# Patient Record
Sex: Male | Born: 1985 | Race: White | Hispanic: No | Marital: Single | Smoking: Current every day smoker
Health system: Southern US, Community
[De-identification: ages and names within clinical notes are randomized; demographics above are authoritative.]

## PROBLEM LIST (undated history)

## (undated) DIAGNOSIS — F909 Attention-deficit hyperactivity disorder, unspecified type: Secondary | ICD-10-CM

## (undated) DIAGNOSIS — E785 Hyperlipidemia, unspecified: Secondary | ICD-10-CM

## (undated) DIAGNOSIS — I10 Essential (primary) hypertension: Secondary | ICD-10-CM

## (undated) DIAGNOSIS — G43909 Migraine, unspecified, not intractable, without status migrainosus: Secondary | ICD-10-CM

## (undated) DIAGNOSIS — E7800 Pure hypercholesterolemia, unspecified: Secondary | ICD-10-CM

## (undated) HISTORY — DX: Attention-deficit hyperactivity disorder, unspecified type: F90.9

## (undated) HISTORY — DX: Migraine, unspecified, not intractable, without status migrainosus: G43.909

## (undated) HISTORY — DX: Essential (primary) hypertension: I10

## (undated) HISTORY — DX: Hyperlipidemia, unspecified: E78.5

## (undated) HISTORY — PX: CYST EXCISION: SHX5701

---
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
## 2013-04-13 NOTE — H&P (Signed)
  NTS SOAP Note  Vital Signs:  Vitals as of: 04/13/2013: Systolic 132: Diastolic 84: Heart Rate 94: Temp 98.5F: Height 44ft 0in: Weight 152Lbs 0 Ounces: Pain Level 8: BMI 20.61  BMI : 20.61 kg/m2  Subjective: This 27 Years 57 Months old Male presents for of a pilonidal cyst.  Has been having recurrent swelling and pain in the pilonidal region for over a year. He was incised and drained in the remote past. He now presents for further management treatment of his chronic pilonidal cyst. He denies any recent drainage. He does have intermittent pain in this region. No fever or chills have been noted.  Review of Symptoms:  Constitutional:unremarkable   Head:unremarkable    Eyes:unremarkable   Nose/Mouth/Throat:unremarkable Cardiovascular:  unremarkable   Respiratory:unremarkable   Gastrointestinal:  unremarkable   Genitourinary:unremarkable     Musculoskeletal:unremarkable   boils Hematolgic/Lymphatic:unremarkable     Allergic/Immunologic:unremarkable     Past Medical History:    Reviewed   Past Medical History  Surgical History: none Medical Problems: none Psychiatric History: none Allergies: no drug allergies Medications: none   Social History:Reviewed  Social History  Preferred Language: English Race:  White Ethnicity: Not Hispanic / Latino Age: 27 Years 7 Months Marital Status:  S Alcohol: none Recreational drug(s): denies   Smoking Status: Current every day smoker reviewed on 03/13/2012 Started Date: 04/29/2000 Packs per day: 0.50 Functional Status reviewed on mm/dd/yyyy ------------------------------------------------ Bathing: Normal Cooking: Normal Dressing: Normal Driving: Normal Eating: Normal Managing Meds: Normal Oral Care: Normal Shopping: Normal Toileting: Normal Transferring: Normal Walking: Normal Cognitive Status reviewed on mm/dd/yyyy ------------------------------------------------ Attention:  Normal Decision Making: Normal Language: Normal Memory: Normal Motor: Normal Perception: Normal Problem Solving: Normal Visual and Spatial: Normal   Family History:  Reviewed  Family Health History Family History is Unknown    Objective Information: General:  Well appearing, well nourished in no distress.      pilonidal cyst present over coccyx with multiple Hairfilled punctums was present. No drainage or erythema noted. Heart:  RRR, no murmur Lungs:    CTA bilaterally, no wheezes, rhonchi, rales.  Breathing unlabored.  Assessment:pilonidal cyst    Plan:scheduled for excision of a pilonidal cyst on 04/16/2013.   Patient Education:Alternative treatments to surgery were discussed with patient (and family).  Risks and benefits  of procedure including bleeding, infection, and recurrence of the cyst were fully explained to the patient (and family) who gave informed consent. Patient/family questions were addressed.  Follow-up:Pending Surgery

## 2013-04-14 ENCOUNTER — Other Ambulatory Visit (HOSPITAL_COMMUNITY): Payer: Self-pay | Admitting: Urology

## 2013-04-14 DIAGNOSIS — R300 Dysuria: Secondary | ICD-10-CM

## 2013-04-14 DIAGNOSIS — D4959 Neoplasm of unspecified behavior of other genitourinary organ: Secondary | ICD-10-CM

## 2013-04-14 NOTE — Patient Instructions (Addendum)
Tyler Dean  04/14/2013   Your procedure is scheduled on:  04/16/2013  Report to [facility] at  11:20  AM.  Call this number if you have problems the morning of surgery: [phone]   Remember:   Do not eat food or drink liquids after midnight.   Take these medicines the morning of surgery with A SIP OF WATER: none   Do not wear jewelry, make-up or nail polish.  Do not wear lotions, powders, or perfumes.  Do not shave 48 hours prior to surgery. Men may shave face and neck.  Do not bring valuables to the hospital.  [facility] is not responsible for any belongings or valuables.               Contacts, dentures or bridgework may not be worn into surgery.  Leave suitcase in the car. After surgery it may be brought to your room.  For patients admitted to the hospital, discharge time is determined by your treatment team.               Patients discharged the day of surgery will not be allowed to drive home.  Name and phone number of your driver: family  Special Instructions: Shower using CHG 2 nights before surgery and the night before surgery.  If you shower the day of surgery use CHG.  Use special wash - you have one bottle of CHG for all showers.  You should use approximately 1/3 of the bottle for each shower.   Please read over the following fact sheets that you were given: Pain Booklet, Coughing and Deep Breathing, Surgical Site Infection Prevention, Anesthesia Post-op Instructions and Care and Recovery After Surgery   Pilonidal Cyst A pilonidal cyst occurs when hairs get trapped (ingrown) beneath the skin in the crease between the buttocks over your sacrum (the bone under that crease). Pilonidal cysts are most common in young men with a lot of body hair. When the cyst is ruptured (breaks) or leaking, fluid from the cyst may cause burning and itching. If the cyst becomes infected, it causes a painful swelling filled with pus (abscess). The pus and trapped hairs need to be  removed (often by lancing) so that the infection can heal. However, recurrence is common and an operation may be needed to remove the cyst. HOME CARE INSTRUCTIONS   If the cyst was NOT INFECTED:  Keep the area clean and dry. Bathe or shower daily. Wash the area well with a germ-killing soap. Warm tub baths may help prevent infection and help with drainage. Dry the area well with a towel.  Avoid tight clothing to keep area as moisture free as possible.  Keep area between buttocks as free of hair as possible. A depilatory may be used.  If the cyst WAS INFECTED and needed to be drained:  Your caregiver packed the wound with gauze to keep the wound open. This allows the wound to heal from the inside outwards and continue draining.  Return for a wound check in 1 day or as suggested.  If you take tub baths or showers, repack the wound with gauze following them. Sponge baths (at the sink) are a good alternative.  If an antibiotic was ordered to fight the infection, take as directed.  Only take over-the-counter or prescription medicines for pain, discomfort, or fever as directed by your caregiver.  After the drain is removed, use sitz baths for 20 minutes 4 times per day. Clean the wound  gently with mild unscented soap, pat dry, and then apply a dry dressing. SEEK MEDICAL CARE IF:   You have increased pain, swelling, redness, drainage, or bleeding from the area.  You have a fever.  You have muscles aches, dizziness, or a general ill feeling. Document Released: 04/12/2000 Document Revised: 07/08/2011 Document Reviewed: 06/10/2008 [facility] Patient Information 2014 [place], [place].  PATIENT INSTRUCTIONS POST-ANESTHESIA  IMMEDIATELY FOLLOWING SURGERY:  Do not drive or operate machinery for the first twenty four hours after surgery.  Do not make any important decisions for twenty four hours after surgery or while taking narcotic pain medications or sedatives.  If you develop intractable  nausea and vomiting or a severe headache please notify your doctor immediately.  FOLLOW-UP:  Please make an appointment with your surgeon as instructed. You do not need to follow up with anesthesia unless specifically instructed to do so.  WOUND CARE INSTRUCTIONS (if applicable):  Keep a dry clean dressing on the anesthesia/puncture wound site if there is drainage.  Once the wound has quit draining you may leave it open to air.  Generally you should leave the bandage intact for twenty four hours unless there is drainage.  If the epidural site drains for more than 36-48 hours please call the anesthesia department.  QUESTIONS?:  Please feel free to call your physician or the hospital operator if you have any questions, and they will be happy to assist you.

## 2013-04-15 ENCOUNTER — Encounter (HOSPITAL_COMMUNITY)
Admission: RE | Admit: 2013-04-15 | Discharge: 2013-04-15 | Disposition: A | Discharge status: Home / Self Care | Payer: Managed Care, Other (non HMO) | Source: Ambulatory Visit | Attending: General Surgery | Admitting: General Surgery

## 2013-04-15 ENCOUNTER — Encounter (HOSPITAL_COMMUNITY): Payer: Self-pay

## 2013-04-15 ENCOUNTER — Other Ambulatory Visit (HOSPITAL_COMMUNITY): Payer: Self-pay | Admitting: Urology

## 2013-04-15 ENCOUNTER — Encounter (HOSPITAL_COMMUNITY): Payer: Self-pay | Admitting: Pharmacy Technician

## 2013-04-15 DIAGNOSIS — N400 Enlarged prostate without lower urinary tract symptoms: Secondary | ICD-10-CM

## 2013-04-15 DIAGNOSIS — R300 Dysuria: Secondary | ICD-10-CM

## 2013-04-15 LAB — RAPID URINE DRUG SCREEN, HOSP PERFORMED
Amphetamines: NOT DETECTED
Barbiturates: NOT DETECTED
Benzodiazepines: NOT DETECTED
Cocaine: NOT DETECTED
Tetrahydrocannabinol: POSITIVE — AB

## 2013-04-15 LAB — CBC WITH DIFFERENTIAL/PLATELET
Basophils Relative: 0 % (ref 0–1)
HCT: 43.1 % (ref 39.0–52.0)
Hemoglobin: 14.7 g/dL (ref 13.0–17.0)
Lymphocytes Relative: 28 % (ref 12–46)
Lymphs Abs: 2 10*3/uL (ref 0.7–4.0)
MCHC: 34.1 g/dL (ref 30.0–36.0)
MCV: 93.9 fL (ref 78.0–100.0)
Monocytes Absolute: 0.6 10*3/uL (ref 0.1–1.0)
Monocytes Relative: 9 % (ref 3–12)
Neutro Abs: 4.4 10*3/uL (ref 1.7–7.7)
RBC: 4.59 MIL/uL (ref 4.22–5.81)
RDW: 12.9 % (ref 11.5–15.5)

## 2013-04-15 LAB — BASIC METABOLIC PANEL
BUN: 12 mg/dL (ref 6–23)
CO2: 28 mEq/L (ref 19–32)
Chloride: 102 mEq/L (ref 96–112)
Creatinine, Ser: 0.8 mg/dL (ref 0.50–1.35)
GFR calc Af Amer: >90 mL/min (ref >90)
GFR calc non Af Amer: >90 mL/min (ref >90)
Glucose, Bld: 99 mg/dL (ref 70–99)
Potassium: 4.9 mEq/L (ref 3.5–5.1)
Sodium: 138 mEq/L (ref 135–145)

## 2013-04-16 ENCOUNTER — Encounter (HOSPITAL_COMMUNITY): Payer: Self-pay | Admitting: *Deleted

## 2013-04-16 ENCOUNTER — Ambulatory Visit (HOSPITAL_COMMUNITY): Payer: Self-pay

## 2013-04-16 ENCOUNTER — Encounter (HOSPITAL_COMMUNITY): Payer: Managed Care, Other (non HMO) | Admitting: Anesthesiology

## 2013-04-16 ENCOUNTER — Encounter (HOSPITAL_COMMUNITY)
Admission: RE | Disposition: A | Discharge status: Home / Self Care | Payer: Self-pay | Source: Ambulatory Visit | Attending: General Surgery

## 2013-04-16 ENCOUNTER — Ambulatory Visit (HOSPITAL_COMMUNITY)
Admission: RE | Admit: 2013-04-16 | Discharge: 2013-04-16 | Disposition: A | Discharge status: Home / Self Care | Payer: Managed Care, Other (non HMO) | Source: Ambulatory Visit | Attending: General Surgery | Admitting: General Surgery

## 2013-04-16 ENCOUNTER — Ambulatory Visit (HOSPITAL_COMMUNITY): Payer: Managed Care, Other (non HMO) | Admitting: Anesthesiology

## 2013-04-16 DIAGNOSIS — L0591 Pilonidal cyst without abscess: Secondary | ICD-10-CM | POA: Insufficient documentation

## 2013-04-16 HISTORY — PX: PILONIDAL CYST EXCISION: SHX744

## 2013-04-16 SURGERY — EXCISION, SIMPLE PILONIDAL CYST
Anesthesia: General | Site: Back

## 2013-04-16 MED ORDER — BUPIVACAINE HCL (PF) 0.5 % IJ SOLN
INTRAMUSCULAR | Status: AC
  Filled 2013-04-16: qty 30

## 2013-04-16 MED ORDER — ONDANSETRON HCL 4 MG/2ML IJ SOLN
4.0000 mg | Freq: Once | INTRAMUSCULAR | Status: AC
  Administered 2013-04-16: 4 mg via INTRAVENOUS

## 2013-04-16 MED ORDER — LIDOCAINE HCL 1 % IJ SOLN
INTRAMUSCULAR | Status: DC | PRN
  Administered 2013-04-16: 50 mg via INTRADERMAL

## 2013-04-16 MED ORDER — MIDAZOLAM HCL 2 MG/2ML IJ SOLN
1.0000 mg | INTRAMUSCULAR | Status: DC | PRN
  Administered 2013-04-16 (×2): 2 mg via INTRAVENOUS
  Filled 2013-04-16: qty 2

## 2013-04-16 MED ORDER — CEFAZOLIN SODIUM-DEXTROSE 2-3 GM-% IV SOLR
INTRAVENOUS | Status: AC
  Filled 2013-04-16: qty 50

## 2013-04-16 MED ORDER — LIDOCAINE HCL (PF) 1 % IJ SOLN
INTRAMUSCULAR | Status: AC
  Filled 2013-04-16: qty 5

## 2013-04-16 MED ORDER — GLYCOPYRROLATE 0.2 MG/ML IJ SOLN
INTRAMUSCULAR | Status: AC
  Filled 2013-04-16: qty 2

## 2013-04-16 MED ORDER — GLYCOPYRROLATE 0.2 MG/ML IJ SOLN
0.2000 mg | Freq: Once | INTRAMUSCULAR | Status: AC
  Administered 2013-04-16: 0.2 mg via INTRAVENOUS

## 2013-04-16 MED ORDER — FENTANYL CITRATE 0.05 MG/ML IJ SOLN
25.0000 ug | INTRAMUSCULAR | Status: AC
  Administered 2013-04-16: 14:00:00 via INTRAVENOUS
  Administered 2013-04-16: 25 ug via INTRAVENOUS

## 2013-04-16 MED ORDER — SODIUM CHLORIDE 0.9 % IR SOLN
Status: DC | PRN
  Administered 2013-04-16: 1000 mL

## 2013-04-16 MED ORDER — FENTANYL CITRATE 0.05 MG/ML IJ SOLN
INTRAMUSCULAR | Status: AC
  Filled 2013-04-16: qty 2

## 2013-04-16 MED ORDER — LACTATED RINGERS IV SOLN
INTRAVENOUS | Status: DC
  Administered 2013-04-16: 13:00:00 via INTRAVENOUS

## 2013-04-16 MED ORDER — POVIDONE-IODINE 10 % EX OINT
TOPICAL_OINTMENT | CUTANEOUS | Status: AC
  Filled 2013-04-16: qty 1

## 2013-04-16 MED ORDER — MIDAZOLAM HCL 5 MG/5ML IJ SOLN
INTRAMUSCULAR | Status: DC | PRN
  Administered 2013-04-16: 2 mg via INTRAVENOUS

## 2013-04-16 MED ORDER — GLYCOPYRROLATE 0.2 MG/ML IJ SOLN
INTRAMUSCULAR | Status: AC
  Filled 2013-04-16: qty 1

## 2013-04-16 MED ORDER — MIDAZOLAM HCL 2 MG/2ML IJ SOLN
INTRAMUSCULAR | Status: AC
  Filled 2013-04-16: qty 2

## 2013-04-16 MED ORDER — PROPOFOL 10 MG/ML IV EMUL
INTRAVENOUS | Status: AC
  Filled 2013-04-16: qty 20

## 2013-04-16 MED ORDER — OXYCODONE-ACETAMINOPHEN 7.5-325 MG PO TABS: 1.0000 | ORAL_TABLET | ORAL | Status: DC | PRN

## 2013-04-16 MED ORDER — KETOROLAC TROMETHAMINE 30 MG/ML IJ SOLN: 30.0000 mg | Freq: Once | INTRAMUSCULAR | Status: DC

## 2013-04-16 MED ORDER — BUPIVACAINE HCL (PF) 0.5 % IJ SOLN
INTRAMUSCULAR | Status: DC | PRN
  Administered 2013-04-16: 6 mL

## 2013-04-16 MED ORDER — CHLORHEXIDINE GLUCONATE 4 % EX LIQD: 1.0000 "application " | Freq: Once | CUTANEOUS | Status: DC

## 2013-04-16 MED ORDER — ONDANSETRON HCL 4 MG/2ML IJ SOLN: 4.0000 mg | Freq: Once | INTRAMUSCULAR | Status: DC | PRN

## 2013-04-16 MED ORDER — POVIDONE-IODINE 10 % OINT PACKET
TOPICAL_OINTMENT | CUTANEOUS | Status: DC | PRN
  Administered 2013-04-16: 1 via TOPICAL

## 2013-04-16 MED ORDER — FENTANYL CITRATE 0.05 MG/ML IJ SOLN
INTRAMUSCULAR | Status: DC | PRN
  Administered 2013-04-16 (×3): 50 ug via INTRAVENOUS

## 2013-04-16 MED ORDER — ONDANSETRON HCL 4 MG/2ML IJ SOLN
INTRAMUSCULAR | Status: AC
  Filled 2013-04-16: qty 2

## 2013-04-16 MED ORDER — PROPOFOL 10 MG/ML IV BOLUS
INTRAVENOUS | Status: DC | PRN
  Administered 2013-04-16: 160 mg via INTRAVENOUS

## 2013-04-16 MED ORDER — FENTANYL CITRATE 0.05 MG/ML IJ SOLN: 25.0000 ug | INTRAMUSCULAR | Status: DC | PRN

## 2013-04-16 MED ORDER — CEFAZOLIN SODIUM-DEXTROSE 2-3 GM-% IV SOLR
2.0000 g | INTRAVENOUS | Status: AC
  Administered 2013-04-16: 2 g via INTRAVENOUS

## 2013-04-16 SURGICAL SUPPLY — 24 items
BAG HAMPER (MISCELLANEOUS) ×2 IMPLANT
CLOTH BEACON ORANGE TIMEOUT ST (SAFETY) ×2 IMPLANT
COVER LIGHT HANDLE STERIS (MISCELLANEOUS) ×4 IMPLANT
ELECT REM PT RETURN 9FT ADLT (ELECTROSURGICAL) ×2
ELECTRODE REM PT RTRN 9FT ADLT (ELECTROSURGICAL) ×1 IMPLANT
FORMALIN 10 PREFIL 120ML (MISCELLANEOUS) ×2 IMPLANT
GLOVE BIO SURGEON STRL SZ7.5 (GLOVE) ×2 IMPLANT
GOWN STRL REIN XL XLG (GOWN DISPOSABLE) ×4 IMPLANT
KIT ROOM TURNOVER APOR (KITS) ×2 IMPLANT
MANIFOLD NEPTUNE II (INSTRUMENTS) ×2 IMPLANT
NDL HYPO 25X1 1.5 SAFETY (NEEDLE) ×1 IMPLANT
NEEDLE HYPO 25X1 1.5 SAFETY (NEEDLE) ×2 IMPLANT
NS IRRIG 1000ML POUR BTL (IV SOLUTION) ×2 IMPLANT
PACK MINOR (CUSTOM PROCEDURE TRAY) ×2 IMPLANT
PAD ARMBOARD 7.5X6 YLW CONV (MISCELLANEOUS) ×2 IMPLANT
SET BASIN LINEN APH (SET/KITS/TRAYS/PACK) ×2 IMPLANT
SOL PREP PROV IODINE SCRUB 4OZ (MISCELLANEOUS) ×2 IMPLANT
SPONGE GAUZE 4X4 12PLY (GAUZE/BANDAGES/DRESSINGS) ×2 IMPLANT
SPONGE LAP 18X18 X RAY DECT (DISPOSABLE) ×2 IMPLANT
SUT PROLENE 2 0 FS (SUTURE) ×2 IMPLANT
SUT PROLENE 3 0 PS 2 (SUTURE) ×3 IMPLANT
SYR CONTROL 10ML LL (SYRINGE) ×2 IMPLANT
TAPE CLOTH SURG 4X10 WHT LF (GAUZE/BANDAGES/DRESSINGS) ×1 IMPLANT
TOWEL OR 17X26 4PK STRL BLUE (TOWEL DISPOSABLE) ×2 IMPLANT

## 2013-04-16 NOTE — Anesthesia Procedure Notes (Signed)
Procedure Name: LMA Insertion Date/Time: 04/16/2013 2:15 PM Performed by: Despina Hidden Pre-anesthesia Checklist: Emergency Drugs available, Patient identified, Patient being monitored and Suction available Patient Re-evaluated:Patient Re-evaluated prior to inductionOxygen Delivery Method: Circle system utilized Preoxygenation: Pre-oxygenation with 100% oxygen Intubation Type: IV induction Ventilation: Mask ventilation without difficulty LMA: LMA inserted LMA Size: 4.0 Tube type: Oral Number of attempts: 1 Placement Confirmation: positive ETCO2 and breath sounds checked- equal and bilateral Tube secured with: Tape Dental Injury: Teeth and Oropharynx as per pre-operative assessment

## 2013-04-16 NOTE — Anesthesia Preprocedure Evaluation (Signed)
Anesthesia Evaluation  Patient identified by MRN, date of birth, ID band Patient awake    Reviewed: Allergy & Precautions, H&P , NPO status , Patient's Chart, lab work & pertinent test results  Airway Mallampati: II TM Distance: >3 FB     Dental  (+) Teeth Intact   Pulmonary neg pulmonary ROS, Current Smoker,  breath sounds clear to auscultation        Cardiovascular negative cardio ROS  Rhythm:Regular Rate:Normal     Neuro/Psych    GI/Hepatic negative GI ROS,   Endo/Other    Renal/GU      Musculoskeletal   Abdominal   Peds  Hematology   Anesthesia Other Findings   Reproductive/Obstetrics                           Anesthesia Physical Anesthesia Plan  ASA: I  Anesthesia Plan: General   Post-op Pain Management:    Induction: Intravenous  Airway Management Planned: LMA  Additional Equipment:   Intra-op Plan:   Post-operative Plan: Extubation in OR  Informed Consent: I have reviewed the patients History and Physical, chart, labs and discussed the procedure including the risks, benefits and alternatives for the proposed anesthesia with the patient or authorized representative who has indicated his/her understanding and acceptance.     Plan Discussed with:   Anesthesia Plan Comments:         Anesthesia Quick Evaluation  

## 2013-04-16 NOTE — Interval H&P Note (Signed)
History and Physical Interval Note:  04/16/2013 1:46 PM  Tyler Dean  has presented today for surgery, with the diagnosis of pilonidal cyst  The various methods of treatment have been discussed with the patient and family. After consideration of risks, benefits and other options for treatment, the patient has consented to  Procedure(s): CYST EXCISION PILONIDAL SIMPLE (N/A) as a surgical intervention .  The patient's history has been reviewed, patient examined, no change in status, stable for surgery.  I have reviewed the patient's chart and labs.  Questions were answered to the patient's satisfaction.     [name]

## 2013-04-16 NOTE — Op Note (Signed)
Patient:  Tyler Dean  DOB:  14-Jul-1985  MRN:  [id number]   Preop Diagnosis:  Pilonidal cyst  Postop Diagnosis:  Same  Procedure:  Excision of pilonidal cyst  Surgeon:  Franky Macho, M.D.  Anes:  General  Indications:  Patient is a 26 year old white male who has had a history of infections of the pilonidal cyst who now presents for surgical excision of the pilonidal cyst. The risks and benefits of the procedure including bleeding, infection, and a possibly recurrence of the cyst were fully explained to the patient, who gave informed consent.  Procedure note:  Patient is placed in the right lateral decubitus position after general anesthesia was Mr. The pilonidal region was prepped and draped using usual sterile technique with DuraPrep. Surgical site confirmation was performed.  The patient had scarring along the midline over the coccyx region. He also had areas of induration and air punctum the left of the midline. Using an elliptical incision, this area was excised towards the midline. 2 areas of granulomatous, Hairfield subcutaneous tissue was found. All was excised fully without difficulty. The wound was irrigated normal saline. The skin was injected with 0.5% Sensorcaine. The skin was reapproximated using 2-0 Prolene interrupted sutures. Betadine ointment and dressed a dressings were applied.  All tape and needle counts were correct at the end of the procedure. Patient was awakened and transferred to PACU in stable condition.  Complications:  None  EBL:  Minimal  Specimen:  Pilonidal cyst

## 2013-04-16 NOTE — Anesthesia Postprocedure Evaluation (Signed)
  Anesthesia Post-op Note  Patient: Tyler Dean  Procedure(s) Performed: Procedure(s): CYST EXCISION PILONIDAL SIMPLE (N/A)  Patient Location: PACU  Anesthesia Type:General  Level of Consciousness: awake, alert , oriented and patient cooperative  Airway and Oxygen Therapy: Patient Spontanous Breathing  Post-op Pain: 2 /10, mild  Post-op Assessment: Post-op Vital signs reviewed, Patient's Cardiovascular Status Stable, Respiratory Function Stable, Patent Airway and Pain level controlled  Post-op Vital Signs: Reviewed and stable  Complications: No apparent anesthesia complications

## 2013-04-16 NOTE — Progress Notes (Signed)
While up in post op room to dress, incision began bleeding--saturated present dressing. Dressing reinforced with folded abd pad. No further drainage noted. Pt to call surgeon on call for further problems.

## 2013-04-16 NOTE — Progress Notes (Signed)
From OR. Awake. Talking. Wants to go home. Denies pain.

## 2013-04-16 NOTE — Transfer of Care (Signed)
Immediate Anesthesia Transfer of Care Note  Patient: Tyler Dean  Procedure(s) Performed: Procedure(s): CYST EXCISION PILONIDAL SIMPLE (N/A)  Patient Location: PACU  Anesthesia Type:General  Level of Consciousness: awake, alert , oriented and patient cooperative  Airway & Oxygen Therapy: Patient Spontanous Breathing and Patient connected to face mask oxygen  Post-op Assessment: Report given to PACU RN, Post -op Vital signs reviewed and stable and Patient moving all extremities  Post vital signs: Reviewed and stable  Complications: No apparent anesthesia complications

## 2013-04-19 ENCOUNTER — Encounter (HOSPITAL_COMMUNITY): Payer: Self-pay

## 2013-04-19 ENCOUNTER — Ambulatory Visit (HOSPITAL_COMMUNITY)
Admission: RE | Admit: 2013-04-19 | Discharge: 2013-04-19 | Disposition: A | Discharge status: Home / Self Care | Payer: Managed Care, Other (non HMO) | Source: Ambulatory Visit | Attending: Urology | Admitting: Urology

## 2013-04-19 ENCOUNTER — Encounter (HOSPITAL_COMMUNITY): Payer: Self-pay | Admitting: Pharmacy Technician

## 2013-04-19 DIAGNOSIS — R300 Dysuria: Secondary | ICD-10-CM | POA: Insufficient documentation

## 2013-04-19 DIAGNOSIS — N289 Disorder of kidney and ureter, unspecified: Secondary | ICD-10-CM | POA: Insufficient documentation

## 2013-04-19 DIAGNOSIS — N400 Enlarged prostate without lower urinary tract symptoms: Secondary | ICD-10-CM

## 2013-04-19 DIAGNOSIS — Q619 Cystic kidney disease, unspecified: Secondary | ICD-10-CM | POA: Insufficient documentation

## 2013-04-19 MED ORDER — IOHEXOL 300 MG/ML  SOLN
150.0000 mL | Freq: Once | INTRAMUSCULAR | Status: AC | PRN
  Administered 2013-04-19: 150 mL via INTRAVENOUS

## 2013-04-19 MED ORDER — SODIUM CHLORIDE 0.9 % IV SOLN
INTRAVENOUS | Status: AC
  Filled 2013-04-19: qty 250

## 2013-04-20 ENCOUNTER — Encounter (HOSPITAL_COMMUNITY): Payer: Self-pay

## 2013-04-20 ENCOUNTER — Encounter (HOSPITAL_COMMUNITY): Payer: Managed Care, Other (non HMO)

## 2013-04-27 ENCOUNTER — Ambulatory Visit (HOSPITAL_COMMUNITY): Payer: Managed Care, Other (non HMO) | Admitting: Anesthesiology

## 2013-04-27 ENCOUNTER — Encounter (HOSPITAL_COMMUNITY)
Admission: RE | Disposition: A | Discharge status: Home / Self Care | Payer: Self-pay | Source: Ambulatory Visit | Attending: Urology

## 2013-04-27 ENCOUNTER — Encounter (HOSPITAL_COMMUNITY): Payer: Managed Care, Other (non HMO) | Admitting: Anesthesiology

## 2013-04-27 ENCOUNTER — Encounter (HOSPITAL_COMMUNITY): Payer: Self-pay | Admitting: *Deleted

## 2013-04-27 ENCOUNTER — Ambulatory Visit (HOSPITAL_COMMUNITY)
Admission: RE | Admit: 2013-04-27 | Discharge: 2013-04-27 | Disposition: A | Discharge status: Home / Self Care | Payer: Managed Care, Other (non HMO) | Source: Ambulatory Visit | Attending: Urology | Admitting: Urology

## 2013-04-27 DIAGNOSIS — L723 Sebaceous cyst: Secondary | ICD-10-CM | POA: Insufficient documentation

## 2013-04-27 DIAGNOSIS — N508 Other specified disorders of male genital organs: Secondary | ICD-10-CM | POA: Insufficient documentation

## 2013-04-27 DIAGNOSIS — F17200 Nicotine dependence, unspecified, uncomplicated: Secondary | ICD-10-CM | POA: Insufficient documentation

## 2013-04-27 DIAGNOSIS — R300 Dysuria: Secondary | ICD-10-CM | POA: Insufficient documentation

## 2013-04-27 DIAGNOSIS — N4889 Other specified disorders of penis: Secondary | ICD-10-CM | POA: Insufficient documentation

## 2013-04-27 HISTORY — PX: CYSTOSCOPY: SHX5120

## 2013-04-27 HISTORY — PX: EAR CYST EXCISION: SHX22

## 2013-04-27 SURGERY — CYSTOSCOPY
Anesthesia: General | Site: Penis

## 2013-04-27 MED ORDER — LIDOCAINE HCL 1 % IJ SOLN
INTRAMUSCULAR | Status: DC | PRN
  Administered 2013-04-27: 50 mg via INTRADERMAL

## 2013-04-27 MED ORDER — POVIDONE-IODINE 10 % EX OINT
TOPICAL_OINTMENT | CUTANEOUS | Status: AC
  Filled 2013-04-27: qty 1

## 2013-04-27 MED ORDER — SODIUM CHLORIDE 0.9 % IR SOLN
Status: DC | PRN
  Administered 2013-04-27: 3000 mL

## 2013-04-27 MED ORDER — ONDANSETRON HCL 4 MG/2ML IJ SOLN: 4.0000 mg | Freq: Once | INTRAMUSCULAR | Status: DC | PRN

## 2013-04-27 MED ORDER — FENTANYL CITRATE 0.05 MG/ML IJ SOLN
INTRAMUSCULAR | Status: AC
  Filled 2013-04-27: qty 2

## 2013-04-27 MED ORDER — GLYCOPYRROLATE 0.2 MG/ML IJ SOLN
INTRAMUSCULAR | Status: AC
  Filled 2013-04-27: qty 1

## 2013-04-27 MED ORDER — LACTATED RINGERS IV SOLN
INTRAVENOUS | Status: DC
  Administered 2013-04-27: 10:00:00 via INTRAVENOUS

## 2013-04-27 MED ORDER — MIDAZOLAM HCL 2 MG/2ML IJ SOLN
INTRAMUSCULAR | Status: AC
  Filled 2013-04-27: qty 2

## 2013-04-27 MED ORDER — MIDAZOLAM HCL 5 MG/5ML IJ SOLN
INTRAMUSCULAR | Status: DC | PRN
  Administered 2013-04-27: 2 mg via INTRAVENOUS

## 2013-04-27 MED ORDER — FENTANYL CITRATE 0.05 MG/ML IJ SOLN: 25.0000 ug | INTRAMUSCULAR | Status: DC | PRN

## 2013-04-27 MED ORDER — ONDANSETRON HCL 4 MG/2ML IJ SOLN
INTRAMUSCULAR | Status: AC
  Filled 2013-04-27: qty 2

## 2013-04-27 MED ORDER — MIDAZOLAM HCL 2 MG/2ML IJ SOLN
1.0000 mg | INTRAMUSCULAR | Status: DC | PRN
  Administered 2013-04-27: 2 mg via INTRAVENOUS

## 2013-04-27 MED ORDER — GLYCOPYRROLATE 0.2 MG/ML IJ SOLN
0.2000 mg | Freq: Once | INTRAMUSCULAR | Status: AC
  Administered 2013-04-27: 0.2 mg via INTRAVENOUS

## 2013-04-27 MED ORDER — ONDANSETRON HCL 4 MG/2ML IJ SOLN
4.0000 mg | Freq: Once | INTRAMUSCULAR | Status: AC
  Administered 2013-04-27: 4 mg via INTRAVENOUS

## 2013-04-27 MED ORDER — PROPOFOL 10 MG/ML IV BOLUS
INTRAVENOUS | Status: DC | PRN
  Administered 2013-04-27: 160 mg via INTRAVENOUS

## 2013-04-27 MED ORDER — FENTANYL CITRATE 0.05 MG/ML IJ SOLN
INTRAMUSCULAR | Status: DC | PRN
  Administered 2013-04-27 (×5): 25 ug via INTRAVENOUS
  Administered 2013-04-27: 50 ug via INTRAVENOUS
  Administered 2013-04-27: 25 ug via INTRAVENOUS

## 2013-04-27 MED ORDER — LIDOCAINE HCL (PF) 1 % IJ SOLN
INTRAMUSCULAR | Status: AC
  Filled 2013-04-27: qty 5

## 2013-04-27 MED ORDER — FENTANYL CITRATE 0.05 MG/ML IJ SOLN
25.0000 ug | INTRAMUSCULAR | Status: AC
  Administered 2013-04-27 (×2): 25 ug via INTRAVENOUS

## 2013-04-27 MED ORDER — STERILE WATER FOR IRRIGATION IR SOLN
Status: DC | PRN
  Administered 2013-04-27: 1000 mL

## 2013-04-27 MED ORDER — PROPOFOL 10 MG/ML IV BOLUS
INTRAVENOUS | Status: AC
  Filled 2013-04-27: qty 20

## 2013-04-27 SURGICAL SUPPLY — 30 items
BAG DRAIN URO TABLE W/ADPT NS (DRAPE) ×3 IMPLANT
BAG DRN 8 ADPR NS SKTRN CSTL (DRAPE) ×2
BAG HAMPER (MISCELLANEOUS) ×3 IMPLANT
BANDAGE CONFORM 2  STR LF (GAUZE/BANDAGES/DRESSINGS) ×1 IMPLANT
CLOTH BEACON ORANGE TIMEOUT ST (SAFETY) ×3 IMPLANT
COVER LIGHT HANDLE STERIS (MISCELLANEOUS) ×2 IMPLANT
ELECT NDL TIP 2.8 STRL (NEEDLE) IMPLANT
ELECT NEEDLE TIP 2.8 STRL (NEEDLE) ×3 IMPLANT
GLOVE BIO SURGEON STRL SZ7 (GLOVE) ×3 IMPLANT
GLOVE BIOGEL PI IND STRL 7.0 (GLOVE) IMPLANT
GLOVE BIOGEL PI INDICATOR 7.0 (GLOVE) ×2
GLOVE EXAM NITRILE LRG STRL (GLOVE) ×1 IMPLANT
GLOVE SS BIOGEL STRL SZ 6.5 (GLOVE) IMPLANT
GLOVE SUPERSENSE BIOGEL SZ 6.5 (GLOVE) ×2
GOWN STRL REIN XL XLG (GOWN DISPOSABLE) ×4 IMPLANT
IV NS IRRIG 3000ML ARTHROMATIC (IV SOLUTION) ×3 IMPLANT
KIT ROOM TURNOVER AP CYSTO (KITS) ×3 IMPLANT
MANIFOLD NEPTUNE II (INSTRUMENTS) ×3 IMPLANT
PACK CYSTO (CUSTOM PROCEDURE TRAY) ×3 IMPLANT
PAD ARMBOARD 7.5X6 YLW CONV (MISCELLANEOUS) ×3 IMPLANT
PENCIL HANDSWITCHING (ELECTRODE) ×1 IMPLANT
SET IRRIGATING DISP (SET/KITS/TRAYS/PACK) ×3 IMPLANT
SPONGE GAUZE 4X4 12PLY (GAUZE/BANDAGES/DRESSINGS) ×1 IMPLANT
SUPPORT SCROTAL LRG NO STRP (SOFTGOODS) ×1 IMPLANT
SUT PROLENE 4 0 PS 2 18 (SUTURE) ×3 IMPLANT
TAPE SURG TRANSPORE 1 IN (GAUZE/BANDAGES/DRESSINGS) IMPLANT
TAPE SURGICAL TRANSPORE 1 IN (GAUZE/BANDAGES/DRESSINGS) ×1
TOWEL OR 17X26 4PK STRL BLUE (TOWEL DISPOSABLE) ×4 IMPLANT
WATER STERILE IRR 1000ML POUR (IV SOLUTION) ×3 IMPLANT
YANKAUER SUCT BULB TIP 10FT TU (MISCELLANEOUS) ×1 IMPLANT

## 2013-04-27 NOTE — OR Nursing (Signed)
Girlfriend took earrings and put in her pocket.

## 2013-04-27 NOTE — Anesthesia Procedure Notes (Signed)
Procedure Name: LMA Insertion Date/Time: 04/27/2013 9:48 AM Performed by: Despina Hidden Pre-anesthesia Checklist: Patient being monitored, Suction available, Emergency Drugs available and Patient identified Patient Re-evaluated:Patient Re-evaluated prior to inductionOxygen Delivery Method: Circle system utilized Preoxygenation: Pre-oxygenation with 100% oxygen Intubation Type: IV induction Ventilation: Mask ventilation without difficulty LMA: LMA inserted LMA Size: 4.0 Tube type: Oral Number of attempts: 1 Placement Confirmation: positive ETCO2 and breath sounds checked- equal and bilateral Tube secured with: Tape Dental Injury: Teeth and Oropharynx as per pre-operative assessment

## 2013-04-27 NOTE — Op Note (Signed)
Op note [phone]

## 2013-04-27 NOTE — Anesthesia Postprocedure Evaluation (Signed)
  Anesthesia Post-op Note  Patient: Tyler Dean  Procedure(s) Performed: Procedure(s): CYSTOSCOPY (N/A) EXCISION OF SEBACEOUS CYST ON PENIS AND SCROTUM (N/A)  Patient Location: PACU  Anesthesia Type:General  Level of Consciousness: awake, alert , oriented and patient cooperative  Airway and Oxygen Therapy: Patient Spontanous Breathing  Post-op Pain: 3 /10, mild  Post-op Assessment: Post-op Vital signs reviewed, Patient's Cardiovascular Status Stable, Respiratory Function Stable, Patent Airway, No signs of Nausea or vomiting and Pain level controlled  Post-op Vital Signs: Reviewed and stable  Complications: No apparent anesthesia complications

## 2013-04-27 NOTE — Anesthesia Preprocedure Evaluation (Signed)
Anesthesia Evaluation  Patient identified by MRN, date of birth, ID band Patient awake    Reviewed: Allergy & Precautions, H&P , NPO status , Patient's Chart, lab work & pertinent test results  Airway Mallampati: II TM Distance: >3 FB     Dental  (+) Teeth Intact   Pulmonary neg pulmonary ROS, Current Smoker,  breath sounds clear to auscultation        Cardiovascular negative cardio ROS  Rhythm:Regular Rate:Normal     Neuro/Psych    GI/Hepatic negative GI ROS,   Endo/Other    Renal/GU      Musculoskeletal   Abdominal   Peds  Hematology   Anesthesia Other Findings   Reproductive/Obstetrics                           Anesthesia Physical Anesthesia Plan  ASA: I  Anesthesia Plan: General   Post-op Pain Management:    Induction: Intravenous  Airway Management Planned: LMA  Additional Equipment:   Intra-op Plan:   Post-operative Plan: Extubation in OR  Informed Consent: I have reviewed the patients History and Physical, chart, labs and discussed the procedure including the risks, benefits and alternatives for the proposed anesthesia with the patient or authorized representative who has indicated his/her understanding and acceptance.     Plan Discussed with:   Anesthesia Plan Comments:         Anesthesia Quick Evaluation

## 2013-04-27 NOTE — Transfer of Care (Signed)
Immediate Anesthesia Transfer of Care Note  Patient: Tyler Dean  Procedure(s) Performed: Procedure(s): CYSTOSCOPY (N/A) EXCISION OF SEBACEOUS CYST ON PENIS AND SCROTUM (N/A)  Patient Location: PACU  Anesthesia Type:General  Level of Consciousness: awake and patient cooperative  Airway & Oxygen Therapy: Patient Spontanous Breathing and Patient connected to face mask oxygen  Post-op Assessment: Report given to PACU RN, Post -op Vital signs reviewed and stable and Patient moving all extremities  Post vital signs: Reviewed and stable  Complications: No apparent anesthesia complications

## 2013-04-27 NOTE — Brief Op Note (Signed)
04/27/2013  10:38 AM  PATIENT:  Tyler Dean  27 y.o. male  PRE-OPERATIVE DIAGNOSIS:  dysuria, cyst on penis and scrotum  POST-OPERATIVE DIAGNOSIS:  dysuria, cyst on penis and scrotum  PROCEDURE:  Procedure(s): CYSTOSCOPY (N/A) EXCISION OF SEBACEOUS CYST ON PENIS AND SCROTUM (N/A)  SURGEON:  Surgeon(s) and Role:    * Ky Barban, MD - Primary  PHYSICIAN ASSISTANT:   ASSISTANTS: none   ANESTHESIA:   general  EBL:  Total I/O In: 800 [I.V.:800] Out: -   BLOOD ADMINISTERED:none  DRAINS: none   LOCAL MEDICATIONS USED:  NONE  SPECIMEN:  Source of Specimen:  scrotal and penile cysts. and Excision  DISPOSITION OF SPECIMEN:  PATHOLOGY  COUNTS:  YES  TOURNIQUET:  * No tourniquets in log *  DICTATION: .Other Dictation: Dictation Number dictation [id number]  PLAN OF CARE: Discharge to home after PACU  PATIENT DISPOSITION:  PACU - hemodynamically stable.   Delay start of Pharmacological VTE agent (>24hrs) due to surgical blood loss or risk of bleeding:

## 2013-04-27 NOTE — H&P (Signed)
NAME:  Tyler Dean, Tyler Dean                    ACCOUNT NO.:  MEDICAL RECORD NO.:  [id number]  LOCATION:                                 FACILITY:  PHYSICIAN:  Ky Barban, M.D.DATE OF BIRTH:  1985/07/03  DATE OF ADMISSION: DATE OF DISCHARGE:  LH                             HISTORY & PHYSICAL   HISTORY OF PRESENT ILLNESS:  This is a 27 year old gentleman who was seen by me on December 16th.  He was referred by Dr. Sherril Croon.  He has occasionally dysuria.  I have checked him for herpes which was negative. He also has a cyst on his penis and scrotum which has been removed, but it keeps coming back.  He has no other urinary complaints.  He says his bladder does not empty completely, but we did a bladder scan and the residual urine was 45 mL which is significant, so I want to do a cystoscopy and also remove the cyst under anesthesia to make sure that he has no other urinary pathology.  No fever, chills, or gross hematuria.  PAST MEDICAL HISTORY:  No history of kidney stones or any other urinary problems.  No diabetes or hypertension.  PERSONAL HISTORY:  Does not smoke or drink.  REVIEW OF SYSTEMS:  Unremarkable.  PHYSICAL EXAMINATION:  GENERAL:  Moderately built male, not in acute distress, fully conscious, alert, oriented.  Urinalysis looks normal. Blood pressure 130/80, temperature is normal. CENTRAL NERVOUS SYSTEM:  No gross neurological deficit. HEAD, NECK, EYE, ENT:  Negative. CHEST:  Symmetrical.  Normal breath sounds. HEART:  Regular sinus rhythm.  No murmur. ABDOMEN:  Soft, flat.  Liver, spleen, and kidneys are not palpable.  No CVA tenderness.  External genitalia is circumcised and meatus adequate. I do not see any ulceration on the glans penis or penile shaft. Testicles are normal. RECTAL:  Deferred. EXTREMITIES:  Normal.  IMPRESSION:  Inability to empty the bladder completely and some dysuria. Penile and scrotal sebaceous cyst.  PLAN:  Cystoscopy, excision of  sebaceous cyst of the penis and scrotum.     Ky Barban, M.D.     MIJ/MEDQ  D:  04/26/2013  T:  04/27/2013  Job:  [id number]

## 2013-04-27 NOTE — Progress Notes (Signed)
No change in H&P on reexamination. 

## 2013-04-28 ENCOUNTER — Encounter (HOSPITAL_COMMUNITY): Payer: Self-pay | Admitting: Urology

## 2013-04-28 NOTE — Op Note (Signed)
Tyler Dean, Tyler Dean               ACCOUNT NO.:  [id number]  MEDICAL RECORD NO.:  [id number]  LOCATION:  [facility]                          FACILITY:  [facility]  PHYSICIAN:  Ky Barban, M.D.DATE OF BIRTH:  [birth year]  DATE OF PROCEDURE: DATE OF DISCHARGE:  04/27/2013                              OPERATIVE REPORT   PREOPERATIVE DIAGNOSIS:  Dysuria and penile and scrotal cysts.  POSTOPERATIVE DIAGNOSIS:  Dysuria and penile and scrotal cysts.  PROCEDURE:  Cystoscopy and excision of penile and scrotal cysts.  ANESTHESIA:  General.  DESCRIPTION OF PROCEDURE:  The patient under general endotracheal anesthesia in supine position.  After usual prep and drape, flexible cystoscope was introduced under direct vision into the urethra. Anterior urethra looks normal.  Prostatic urethra is open.  Bladder is smooth.  No tumor, stone, foreign body, or inflammation.  The entire area which I went through mucosa looks normal.  I cannot explain why he is having dysuria.  After looking into the bladder, no tumor, stone, foreign body, or inflammation in the bladder.  Both orifices located at normal side with a clear efflux.  The scope was removed.  I proceeded to do the excision of the cyst.  There are 2 cysts, one on the penis and one on the mid scrotum.  The penile cyst was located near the base of the penis.  The scrotal cysts were held with Allis clamps and circumscribed with the help of a knife, completely removed with skin covering like that.  The fascial layers were cut with the help of the cautery to get hemostasis.  After excising the cyst, the bleeders were coagulated with the cautery and the skin was approximated using 4-0 Prolene.  Same thing was done to the penile cyst.  It was grabbed with the help of an Allis clamp and circumscribed with the help of a knife and divided with the help of cautery.  Hemostasis was obtained by cauterizing the bleeders.  Skin was approximated using 4-0  Prolene interrupted sutures.  At the end, Betadine ointment applied to the incision and sterile gauze dressing were applied.  The patient left the operating room in a satisfactory condition.     Ky Barban, M.D.     MIJ/MEDQ  D:  04/27/2013  T:  04/28/2013  Job:  [id number]

## 2015-05-26 ENCOUNTER — Telehealth (HOSPITAL_COMMUNITY): Payer: Self-pay | Admitting: *Deleted

## 2015-06-02 ENCOUNTER — Telehealth (HOSPITAL_COMMUNITY): Payer: Self-pay | Admitting: *Deleted

## 2015-06-13 DIAGNOSIS — L259 Unspecified contact dermatitis, unspecified cause: Secondary | ICD-10-CM | POA: Insufficient documentation

## 2015-08-10 DIAGNOSIS — F902 Attention-deficit hyperactivity disorder, combined type: Secondary | ICD-10-CM | POA: Diagnosis not present

## 2015-08-10 DIAGNOSIS — Z6823 Body mass index (BMI) 23.0-23.9, adult: Secondary | ICD-10-CM | POA: Diagnosis not present

## 2015-08-10 DIAGNOSIS — E782 Mixed hyperlipidemia: Secondary | ICD-10-CM | POA: Diagnosis not present

## 2015-10-03 DIAGNOSIS — F902 Attention-deficit hyperactivity disorder, combined type: Secondary | ICD-10-CM | POA: Diagnosis not present

## 2015-10-03 DIAGNOSIS — E782 Mixed hyperlipidemia: Secondary | ICD-10-CM | POA: Diagnosis not present

## 2015-10-03 DIAGNOSIS — Z6824 Body mass index (BMI) 24.0-24.9, adult: Secondary | ICD-10-CM | POA: Diagnosis not present

## 2015-11-10 DIAGNOSIS — F17200 Nicotine dependence, unspecified, uncomplicated: Secondary | ICD-10-CM | POA: Diagnosis not present

## 2015-11-10 DIAGNOSIS — E784 Other hyperlipidemia: Secondary | ICD-10-CM | POA: Diagnosis not present

## 2015-11-16 DIAGNOSIS — M9903 Segmental and somatic dysfunction of lumbar region: Secondary | ICD-10-CM | POA: Diagnosis not present

## 2015-11-16 DIAGNOSIS — M5136 Other intervertebral disc degeneration, lumbar region: Secondary | ICD-10-CM | POA: Diagnosis not present

## 2015-11-20 DIAGNOSIS — M5136 Other intervertebral disc degeneration, lumbar region: Secondary | ICD-10-CM | POA: Diagnosis not present

## 2015-11-20 DIAGNOSIS — M9903 Segmental and somatic dysfunction of lumbar region: Secondary | ICD-10-CM | POA: Diagnosis not present

## 2015-11-22 DIAGNOSIS — M5136 Other intervertebral disc degeneration, lumbar region: Secondary | ICD-10-CM | POA: Diagnosis not present

## 2015-11-22 DIAGNOSIS — M9903 Segmental and somatic dysfunction of lumbar region: Secondary | ICD-10-CM | POA: Diagnosis not present

## 2015-11-23 DIAGNOSIS — M5136 Other intervertebral disc degeneration, lumbar region: Secondary | ICD-10-CM | POA: Diagnosis not present

## 2015-11-23 DIAGNOSIS — M9903 Segmental and somatic dysfunction of lumbar region: Secondary | ICD-10-CM | POA: Diagnosis not present

## 2015-11-27 DIAGNOSIS — M9903 Segmental and somatic dysfunction of lumbar region: Secondary | ICD-10-CM | POA: Diagnosis not present

## 2015-11-27 DIAGNOSIS — M5136 Other intervertebral disc degeneration, lumbar region: Secondary | ICD-10-CM | POA: Diagnosis not present

## 2015-11-30 DIAGNOSIS — M5136 Other intervertebral disc degeneration, lumbar region: Secondary | ICD-10-CM | POA: Diagnosis not present

## 2015-11-30 DIAGNOSIS — M9903 Segmental and somatic dysfunction of lumbar region: Secondary | ICD-10-CM | POA: Diagnosis not present

## 2015-12-04 DIAGNOSIS — M5136 Other intervertebral disc degeneration, lumbar region: Secondary | ICD-10-CM | POA: Diagnosis not present

## 2015-12-04 DIAGNOSIS — M9903 Segmental and somatic dysfunction of lumbar region: Secondary | ICD-10-CM | POA: Diagnosis not present

## 2015-12-06 DIAGNOSIS — M5136 Other intervertebral disc degeneration, lumbar region: Secondary | ICD-10-CM | POA: Diagnosis not present

## 2015-12-06 DIAGNOSIS — M9903 Segmental and somatic dysfunction of lumbar region: Secondary | ICD-10-CM | POA: Diagnosis not present

## 2015-12-11 DIAGNOSIS — M9903 Segmental and somatic dysfunction of lumbar region: Secondary | ICD-10-CM | POA: Diagnosis not present

## 2015-12-11 DIAGNOSIS — M5136 Other intervertebral disc degeneration, lumbar region: Secondary | ICD-10-CM | POA: Diagnosis not present

## 2015-12-12 DIAGNOSIS — M9903 Segmental and somatic dysfunction of lumbar region: Secondary | ICD-10-CM | POA: Diagnosis not present

## 2015-12-12 DIAGNOSIS — M5136 Other intervertebral disc degeneration, lumbar region: Secondary | ICD-10-CM | POA: Diagnosis not present

## 2015-12-14 DIAGNOSIS — M5136 Other intervertebral disc degeneration, lumbar region: Secondary | ICD-10-CM | POA: Diagnosis not present

## 2015-12-14 DIAGNOSIS — M9903 Segmental and somatic dysfunction of lumbar region: Secondary | ICD-10-CM | POA: Diagnosis not present

## 2015-12-18 DIAGNOSIS — M9903 Segmental and somatic dysfunction of lumbar region: Secondary | ICD-10-CM | POA: Diagnosis not present

## 2015-12-18 DIAGNOSIS — M5136 Other intervertebral disc degeneration, lumbar region: Secondary | ICD-10-CM | POA: Diagnosis not present

## 2015-12-28 DIAGNOSIS — M9903 Segmental and somatic dysfunction of lumbar region: Secondary | ICD-10-CM | POA: Diagnosis not present

## 2015-12-28 DIAGNOSIS — M5136 Other intervertebral disc degeneration, lumbar region: Secondary | ICD-10-CM | POA: Diagnosis not present

## 2016-01-02 DIAGNOSIS — M5136 Other intervertebral disc degeneration, lumbar region: Secondary | ICD-10-CM | POA: Diagnosis not present

## 2016-01-02 DIAGNOSIS — M9903 Segmental and somatic dysfunction of lumbar region: Secondary | ICD-10-CM | POA: Diagnosis not present

## 2016-01-03 DIAGNOSIS — M5136 Other intervertebral disc degeneration, lumbar region: Secondary | ICD-10-CM | POA: Diagnosis not present

## 2016-01-03 DIAGNOSIS — F902 Attention-deficit hyperactivity disorder, combined type: Secondary | ICD-10-CM | POA: Diagnosis not present

## 2016-01-03 DIAGNOSIS — Z6825 Body mass index (BMI) 25.0-25.9, adult: Secondary | ICD-10-CM | POA: Diagnosis not present

## 2016-01-03 DIAGNOSIS — M9903 Segmental and somatic dysfunction of lumbar region: Secondary | ICD-10-CM | POA: Diagnosis not present

## 2016-01-03 DIAGNOSIS — E782 Mixed hyperlipidemia: Secondary | ICD-10-CM | POA: Diagnosis not present

## 2016-01-03 DIAGNOSIS — Z1389 Encounter for screening for other disorder: Secondary | ICD-10-CM | POA: Diagnosis not present

## 2016-01-08 DIAGNOSIS — M9903 Segmental and somatic dysfunction of lumbar region: Secondary | ICD-10-CM | POA: Diagnosis not present

## 2016-01-08 DIAGNOSIS — M5136 Other intervertebral disc degeneration, lumbar region: Secondary | ICD-10-CM | POA: Diagnosis not present

## 2016-01-10 DIAGNOSIS — M9903 Segmental and somatic dysfunction of lumbar region: Secondary | ICD-10-CM | POA: Diagnosis not present

## 2016-01-10 DIAGNOSIS — M5136 Other intervertebral disc degeneration, lumbar region: Secondary | ICD-10-CM | POA: Diagnosis not present

## 2016-01-11 DIAGNOSIS — M9903 Segmental and somatic dysfunction of lumbar region: Secondary | ICD-10-CM | POA: Diagnosis not present

## 2016-01-11 DIAGNOSIS — M5136 Other intervertebral disc degeneration, lumbar region: Secondary | ICD-10-CM | POA: Diagnosis not present

## 2016-01-16 DIAGNOSIS — M9903 Segmental and somatic dysfunction of lumbar region: Secondary | ICD-10-CM | POA: Diagnosis not present

## 2016-01-16 DIAGNOSIS — M5136 Other intervertebral disc degeneration, lumbar region: Secondary | ICD-10-CM | POA: Diagnosis not present

## 2016-01-18 DIAGNOSIS — M9903 Segmental and somatic dysfunction of lumbar region: Secondary | ICD-10-CM | POA: Diagnosis not present

## 2016-01-18 DIAGNOSIS — M5136 Other intervertebral disc degeneration, lumbar region: Secondary | ICD-10-CM | POA: Diagnosis not present

## 2016-01-22 DIAGNOSIS — M9903 Segmental and somatic dysfunction of lumbar region: Secondary | ICD-10-CM | POA: Diagnosis not present

## 2016-01-22 DIAGNOSIS — M5136 Other intervertebral disc degeneration, lumbar region: Secondary | ICD-10-CM | POA: Diagnosis not present

## 2016-01-23 DIAGNOSIS — M5136 Other intervertebral disc degeneration, lumbar region: Secondary | ICD-10-CM | POA: Diagnosis not present

## 2016-01-23 DIAGNOSIS — M9903 Segmental and somatic dysfunction of lumbar region: Secondary | ICD-10-CM | POA: Diagnosis not present

## 2016-01-29 DIAGNOSIS — M9903 Segmental and somatic dysfunction of lumbar region: Secondary | ICD-10-CM | POA: Diagnosis not present

## 2016-01-29 DIAGNOSIS — M5136 Other intervertebral disc degeneration, lumbar region: Secondary | ICD-10-CM | POA: Diagnosis not present

## 2016-01-31 DIAGNOSIS — M9903 Segmental and somatic dysfunction of lumbar region: Secondary | ICD-10-CM | POA: Diagnosis not present

## 2016-01-31 DIAGNOSIS — M5136 Other intervertebral disc degeneration, lumbar region: Secondary | ICD-10-CM | POA: Diagnosis not present

## 2016-02-07 DIAGNOSIS — M5136 Other intervertebral disc degeneration, lumbar region: Secondary | ICD-10-CM | POA: Diagnosis not present

## 2016-02-07 DIAGNOSIS — M9903 Segmental and somatic dysfunction of lumbar region: Secondary | ICD-10-CM | POA: Diagnosis not present

## 2016-02-13 DIAGNOSIS — M9903 Segmental and somatic dysfunction of lumbar region: Secondary | ICD-10-CM | POA: Diagnosis not present

## 2016-02-13 DIAGNOSIS — M5136 Other intervertebral disc degeneration, lumbar region: Secondary | ICD-10-CM | POA: Diagnosis not present

## 2016-02-14 DIAGNOSIS — M5136 Other intervertebral disc degeneration, lumbar region: Secondary | ICD-10-CM | POA: Diagnosis not present

## 2016-02-14 DIAGNOSIS — M9903 Segmental and somatic dysfunction of lumbar region: Secondary | ICD-10-CM | POA: Diagnosis not present

## 2016-02-15 DIAGNOSIS — M5136 Other intervertebral disc degeneration, lumbar region: Secondary | ICD-10-CM | POA: Diagnosis not present

## 2016-02-15 DIAGNOSIS — M9903 Segmental and somatic dysfunction of lumbar region: Secondary | ICD-10-CM | POA: Diagnosis not present

## 2016-02-19 DIAGNOSIS — M5136 Other intervertebral disc degeneration, lumbar region: Secondary | ICD-10-CM | POA: Diagnosis not present

## 2016-02-19 DIAGNOSIS — M9903 Segmental and somatic dysfunction of lumbar region: Secondary | ICD-10-CM | POA: Diagnosis not present

## 2016-02-21 DIAGNOSIS — M5136 Other intervertebral disc degeneration, lumbar region: Secondary | ICD-10-CM | POA: Diagnosis not present

## 2016-02-21 DIAGNOSIS — M9903 Segmental and somatic dysfunction of lumbar region: Secondary | ICD-10-CM | POA: Diagnosis not present

## 2016-02-22 DIAGNOSIS — M9903 Segmental and somatic dysfunction of lumbar region: Secondary | ICD-10-CM | POA: Diagnosis not present

## 2016-02-22 DIAGNOSIS — M5136 Other intervertebral disc degeneration, lumbar region: Secondary | ICD-10-CM | POA: Diagnosis not present

## 2016-02-25 DIAGNOSIS — R0789 Other chest pain: Secondary | ICD-10-CM | POA: Diagnosis not present

## 2016-02-27 DIAGNOSIS — F411 Generalized anxiety disorder: Secondary | ICD-10-CM | POA: Insufficient documentation

## 2016-02-27 DIAGNOSIS — M5136 Other intervertebral disc degeneration, lumbar region: Secondary | ICD-10-CM | POA: Diagnosis not present

## 2016-02-27 DIAGNOSIS — M9903 Segmental and somatic dysfunction of lumbar region: Secondary | ICD-10-CM | POA: Diagnosis not present

## 2016-02-28 ENCOUNTER — Emergency Department (HOSPITAL_COMMUNITY): Payer: BLUE CROSS/BLUE SHIELD

## 2016-02-28 ENCOUNTER — Encounter (HOSPITAL_COMMUNITY): Payer: Self-pay

## 2016-02-28 ENCOUNTER — Emergency Department (HOSPITAL_COMMUNITY)
Admission: EM | Admit: 2016-02-28 | Discharge: 2016-02-29 | Disposition: A | Discharge status: Home / Self Care | Payer: BLUE CROSS/BLUE SHIELD | Attending: Emergency Medicine | Admitting: Emergency Medicine

## 2016-02-28 DIAGNOSIS — F17210 Nicotine dependence, cigarettes, uncomplicated: Secondary | ICD-10-CM | POA: Insufficient documentation

## 2016-02-28 DIAGNOSIS — M9903 Segmental and somatic dysfunction of lumbar region: Secondary | ICD-10-CM | POA: Diagnosis not present

## 2016-02-28 DIAGNOSIS — M542 Cervicalgia: Secondary | ICD-10-CM | POA: Diagnosis not present

## 2016-02-28 DIAGNOSIS — R0602 Shortness of breath: Secondary | ICD-10-CM | POA: Insufficient documentation

## 2016-02-28 DIAGNOSIS — Z79899 Other long term (current) drug therapy: Secondary | ICD-10-CM | POA: Insufficient documentation

## 2016-02-28 DIAGNOSIS — F411 Generalized anxiety disorder: Secondary | ICD-10-CM | POA: Diagnosis not present

## 2016-02-28 DIAGNOSIS — M5136 Other intervertebral disc degeneration, lumbar region: Secondary | ICD-10-CM | POA: Diagnosis not present

## 2016-02-28 DIAGNOSIS — Z6824 Body mass index (BMI) 24.0-24.9, adult: Secondary | ICD-10-CM | POA: Diagnosis not present

## 2016-02-28 DIAGNOSIS — R079 Chest pain, unspecified: Secondary | ICD-10-CM | POA: Insufficient documentation

## 2016-02-28 HISTORY — DX: Pure hypercholesterolemia, unspecified: E78.00

## 2016-02-28 LAB — BASIC METABOLIC PANEL
ANION GAP: 7 (ref 5–15)
BUN: 13 mg/dL (ref 6–20)
CALCIUM: 9.3 mg/dL (ref 8.9–10.3)
CO2: 28 mmol/L (ref 22–32)
Chloride: 101 mmol/L (ref 101–111)
Creatinine, Ser: 0.81 mg/dL (ref 0.61–1.24)
Glucose, Bld: 98 mg/dL (ref 65–99)
Potassium: 3.7 mmol/L (ref 3.5–5.1)
Sodium: 136 mmol/L (ref 135–145)

## 2016-02-28 LAB — CBC
HCT: 43.2 % (ref 39.0–52.0)
HEMOGLOBIN: 15.2 g/dL (ref 13.0–17.0)
MCH: 32.1 pg (ref 26.0–34.0)
MCHC: 35.2 g/dL (ref 30.0–36.0)
MCV: 91.3 fL (ref 78.0–100.0)
Platelets: 258 10*3/uL (ref 150–400)
RBC: 4.73 MIL/uL (ref 4.22–5.81)
RDW: 12.2 % (ref 11.5–15.5)
WBC: 7.1 10*3/uL (ref 4.0–10.5)

## 2016-02-28 LAB — I-STAT TROPONIN, ED: TROPONIN I, POC: 0 ng/mL (ref 0.00–0.08)

## 2016-02-28 LAB — TROPONIN I

## 2016-02-28 MED ORDER — IOPAMIDOL (ISOVUE-370) INJECTION 76%
100.0000 mL | Freq: Once | INTRAVENOUS | Status: AC | PRN
  Administered 2016-02-28: 100 mL via INTRAVENOUS

## 2016-02-28 MED ORDER — SODIUM CHLORIDE 0.9 % IV BOLUS (SEPSIS)
1000.0000 mL | Freq: Once | INTRAVENOUS | Status: AC
  Administered 2016-02-28: 1000 mL via INTRAVENOUS

## 2016-02-28 NOTE — ED Provider Notes (Signed)
[facility] DEPT Provider Note   CSN: [id number] Arrival date & time: 02/28/16  [id number]  By signing my name below, I, Dora Sims, attest that this documentation has been prepared under the direction and in the presence of physician practitioner, Isla Pence, MD. Electronically Signed: Dora Sims, Scribe. 02/28/2016. 9:22 PM.  History   Chief Complaint Chief Complaint  Patient presents with  . Chest Pain    The history is provided by the patient. No language interpreter was used.     HPI Comments: CLEAVELAND SEGA is a 30 y.o. male with PMHx significant for HLD who presents to the Emergency Department complaining of sudden onset, intermittent, gradually worsening, central chest pain for the last 6 days. He notes associated pain throughout his bilateral upper extremities, most significantly in his biceps. He reports some numbness in his upper extremities as well and states that sometimes "it feels like there is no circulation in my hands." He also notes some fatigue and disorientation/confusion since onset. Pt notes some SOB when his CP presents that is worse with exertion. He states he gets sweaty and clammy when his CP presents. He denies experiencing similar symptoms in the past. He notes he called EMS 4 days ago due to his chest pain and was told he was having a panic attack; he did not go to the hospital. Pt was seen at his PCP's office earlier today and was advised to come to the ER. He states he smokes 0.5 ppd. Pt uses medication for ADHD regularly. He denies fever, chills, color change, wounds, rash, or any other associated symptoms.  Past Medical History:  Diagnosis Date  . High cholesterol     There are no active problems to display for this patient.   Past Surgical History:  Procedure Laterality Date  . CYSTOSCOPY N/A 04/27/2013   Procedure: CYSTOSCOPY;  Surgeon: Marissa Nestle, MD;  Location: [facility];  Service: Urology;  Laterality: N/A;  . EAR CYST EXCISION N/A  04/27/2013   Procedure: EXCISION OF SEBACEOUS CYST ON PENIS AND SCROTUM;  Surgeon: Marissa Nestle, MD;  Location: [facility];  Service: Urology;  Laterality: N/A;  . PILONIDAL CYST EXCISION N/A 04/16/2013   Procedure: CYST EXCISION PILONIDAL SIMPLE;  Surgeon: Jamesetta So, MD;  Location: [facility];  Service: General;  Laterality: N/A;       Home Medications    Prior to Admission medications   Medication Sig Start Date End Date Taking? Authorizing Provider  amphetamine-dextroamphetamine (ADDERALL) 30 MG tablet Take 30 mg by mouth every morning.   Yes Historical Provider, MD  aspirin EC 81 MG tablet Take 81-243 mg by mouth daily as needed for mild pain or moderate pain.   Yes Historical Provider, MD  ketorolac (TORADOL) 10 MG tablet Take 1 tablet (10 mg total) by mouth every 6 (six) hours as needed. 02/29/16   Isla Pence, MD    Family History No family history on file.  Social History Social History  Substance Use Topics  . Smoking status: Current Every Day Smoker    Packs/day: 0.50    Years: 10.00    Types: Cigarettes  . Smokeless tobacco: Never Used  . Alcohol use No     Allergies   Review of patient's allergies indicates no known allergies.   Review of Systems Review of Systems  Constitutional: Positive for diaphoresis and fatigue. Negative for chills and fever.  Respiratory: Positive for shortness of breath.   Cardiovascular: Positive for chest pain.  Musculoskeletal: Positive for  myalgias.  Skin: Negative for color change, rash and wound.  Neurological: Positive for numbness.  Psychiatric/Behavioral: Positive for confusion.  All other systems reviewed and are negative.   Physical Exam Updated Vital Signs BP 115/72   Pulse 88   Temp 97.5 F (36.4 C) (Oral)   Resp 18   Ht 6' (1.829 m)   Wt 183 lb (83 kg)   SpO2 99%   BMI 24.82 kg/m   Physical Exam  Constitutional: He is oriented to person, place, and time. He appears well-developed and well-nourished.  No distress.  HENT:  Head: Normocephalic and atraumatic.  Eyes: Conjunctivae and EOM are normal.  Neck: Neck supple. No tracheal deviation present.  Cardiovascular: Regular rhythm.  Tachycardia present.   Pulmonary/Chest: Effort normal. No respiratory distress.  Musculoskeletal: Normal range of motion.  Neurological: He is alert and oriented to person, place, and time.  Skin: Skin is warm and dry.  Psychiatric: He has a normal mood and affect. His behavior is normal.  Nursing note and vitals reviewed.   ED Treatments / Results  Labs (all labs ordered are listed, but only abnormal results are displayed) Labs Reviewed  BASIC METABOLIC PANEL  CBC  TROPONIN I  I-STAT TROPOININ, ED    EKG  EKG Interpretation  Date/Time:  Wednesday February 28 2016 18:59:53 EDT Ventricular Rate:  99 PR Interval:  120 QRS Duration: 74 QT Interval:  358 QTC Calculation: 459 R Axis:   45 Text Interpretation:  Normal sinus rhythm Septal infarct , age undetermined Abnormal ECG Confirmed by [facility] MD, Teondre Jarosz ([id number]) on 02/28/2016 9:20:59 PM       Radiology Dg Chest 2 View  Result Date: 02/28/2016 CLINICAL DATA:  Chest pain since Thursday radiating down both arms EXAM: CHEST  2 VIEW COMPARISON:  None. FINDINGS: The heart size and mediastinal contours are within normal limits. Both lungs are clear. The visualized skeletal structures are unremarkable. IMPRESSION: No active cardiopulmonary disease. Electronically Signed   By: Donavan Foil M.D.   On: 02/28/2016 19:28   Dg Cervical Spine Complete  Result Date: 02/28/2016 CLINICAL DATA:  Acute onset of neck and bilateral arm pain. Initial encounter. EXAM: CERVICAL SPINE - COMPLETE 4+ VIEW COMPARISON:  None. FINDINGS: There is no evidence of fracture or subluxation. Vertebral bodies demonstrate normal height and alignment. Intervertebral disc spaces are preserved. Prevertebral soft tissues are within normal limits. The provided odontoid view demonstrates  no significant abnormality. The visualized lung apices are clear. IMPRESSION: No evidence of fracture or subluxation along the cervical spine. Electronically Signed   By: Garald Balding M.D.   On: 02/28/2016 23:55   Ct Angio Chest Pe W And/or Wo Contrast  Result Date: 02/28/2016 CLINICAL DATA:  Back pain with shortness of breath EXAM: CT ANGIOGRAPHY CHEST WITH CONTRAST TECHNIQUE: Multidetector CT imaging of the chest was performed using the standard protocol during bolus administration of intravenous contrast. Multiplanar CT image reconstructions and MIPs were obtained to evaluate the vascular anatomy. CONTRAST:  100 mL Isovue 370 intravenous COMPARISON:  Chest x-ray 02/28/2016 FINDINGS: Cardiovascular: No evidence for filling defects within the central or segmental pulmonary arteries to suggest the presence of an acute embolus. Thoracic aorta is non aneurysmal. No dissection. No mediastinal hematoma. Heart size normal. No pericardial effusion. Mediastinum/Nodes: No enlarged mediastinal, hilar, or axillary lymph nodes. Thyroid gland, trachea, and esophagus demonstrate no significant findings. Lungs/Pleura: Small bleb in the posterior left lower lobe. 6 mm ovoid density along the right fissure. No acute infiltrate, consolidation, effusion  or pneumothorax. Upper Abdomen: No acute abnormality. Musculoskeletal: No chest wall abnormality. No acute or significant osseous findings. Review of the MIP images confirms the above findings. IMPRESSION: 1. No CT evidence for acute pulmonary embolus or aortic dissection. 2. Nonspecific 6 mm ovoid soft tissue density along the right pulmonary fissure, possible focus of inflammatory infiltrate or atelectasis. Nodule felt unlikely. Electronically Signed   By: Donavan Foil M.D.   On: 02/28/2016 23:02    Procedures Procedures (including critical care time)  DIAGNOSTIC STUDIES: Oxygen Saturation is 100% on RA, normal by my interpretation.    COORDINATION OF CARE: 9:26 PM  Discussed treatment plan with pt at bedside and pt agreed to plan.  Medications Ordered in ED Medications  sodium chloride 0.9 % bolus 1,000 mL (0 mLs Intravenous Stopped 02/29/16 0011)  iopamidol (ISOVUE-370) 76 % injection 100 mL (100 mLs Intravenous Contrast Given 02/28/16 2243)  ketorolac (TORADOL) 30 MG/ML injection 30 mg (30 mg Intravenous Given 02/29/16 0023)     Initial Impression / Assessment and Plan / ED Course  I have reviewed the triage vital signs and the nursing notes.  Pertinent labs & imaging results that were available during my care of the patient were reviewed by me and considered in my medical decision making (see chart for details).  Clinical Course   Pt is feeling better.  2nd troponin negative.  CT angio neg.  Pt ok for d/c.  I personally performed the services described in this documentation, which was scribed in my presence. The recorded information has been reviewed and is accurate.   Final Clinical Impressions(s) / ED Diagnoses   Final diagnoses:  Chest pain, unspecified type    New Prescriptions Discharge Medication List as of 02/29/2016 12:20 AM    START taking these medications   Details  ketorolac (TORADOL) 10 MG tablet Take 1 tablet (10 mg total) by mouth every 6 (six) hours as needed., Starting Thu 02/29/2016, Print         Isla Pence, MD 02/29/16 0031

## 2016-02-28 NOTE — ED Triage Notes (Signed)
Complains of chest pain since last Thursday. States pain has been constant and radiates down both arms.

## 2016-02-29 MED ORDER — KETOROLAC TROMETHAMINE 10 MG PO TABS: 10.0000 mg | ORAL_TABLET | Freq: Four times a day (QID) | ORAL | 0 refills | Status: DC | PRN

## 2016-02-29 MED ORDER — KETOROLAC TROMETHAMINE 30 MG/ML IJ SOLN
30.0000 mg | Freq: Once | INTRAMUSCULAR | Status: AC
  Administered 2016-02-29: 30 mg via INTRAVENOUS
  Filled 2016-02-29: qty 1

## 2016-03-04 DIAGNOSIS — M5136 Other intervertebral disc degeneration, lumbar region: Secondary | ICD-10-CM | POA: Diagnosis not present

## 2016-03-04 DIAGNOSIS — M9903 Segmental and somatic dysfunction of lumbar region: Secondary | ICD-10-CM | POA: Diagnosis not present

## 2016-03-05 DIAGNOSIS — M9903 Segmental and somatic dysfunction of lumbar region: Secondary | ICD-10-CM | POA: Diagnosis not present

## 2016-03-05 DIAGNOSIS — M5136 Other intervertebral disc degeneration, lumbar region: Secondary | ICD-10-CM | POA: Diagnosis not present

## 2016-03-06 DIAGNOSIS — M9903 Segmental and somatic dysfunction of lumbar region: Secondary | ICD-10-CM | POA: Diagnosis not present

## 2016-03-06 DIAGNOSIS — M5136 Other intervertebral disc degeneration, lumbar region: Secondary | ICD-10-CM | POA: Diagnosis not present

## 2016-03-07 DIAGNOSIS — M9903 Segmental and somatic dysfunction of lumbar region: Secondary | ICD-10-CM | POA: Diagnosis not present

## 2016-03-07 DIAGNOSIS — M5136 Other intervertebral disc degeneration, lumbar region: Secondary | ICD-10-CM | POA: Diagnosis not present

## 2016-03-08 DIAGNOSIS — Z23 Encounter for immunization: Secondary | ICD-10-CM | POA: Diagnosis not present

## 2016-03-12 DIAGNOSIS — M5136 Other intervertebral disc degeneration, lumbar region: Secondary | ICD-10-CM | POA: Diagnosis not present

## 2016-03-12 DIAGNOSIS — M9903 Segmental and somatic dysfunction of lumbar region: Secondary | ICD-10-CM | POA: Diagnosis not present

## 2016-03-13 DIAGNOSIS — M5136 Other intervertebral disc degeneration, lumbar region: Secondary | ICD-10-CM | POA: Diagnosis not present

## 2016-03-13 DIAGNOSIS — M9903 Segmental and somatic dysfunction of lumbar region: Secondary | ICD-10-CM | POA: Diagnosis not present

## 2016-03-14 DIAGNOSIS — M5136 Other intervertebral disc degeneration, lumbar region: Secondary | ICD-10-CM | POA: Diagnosis not present

## 2016-03-14 DIAGNOSIS — M9903 Segmental and somatic dysfunction of lumbar region: Secondary | ICD-10-CM | POA: Diagnosis not present

## 2016-03-18 DIAGNOSIS — M5136 Other intervertebral disc degeneration, lumbar region: Secondary | ICD-10-CM | POA: Diagnosis not present

## 2016-03-18 DIAGNOSIS — M9903 Segmental and somatic dysfunction of lumbar region: Secondary | ICD-10-CM | POA: Diagnosis not present

## 2016-03-19 DIAGNOSIS — M5136 Other intervertebral disc degeneration, lumbar region: Secondary | ICD-10-CM | POA: Diagnosis not present

## 2016-03-19 DIAGNOSIS — M9903 Segmental and somatic dysfunction of lumbar region: Secondary | ICD-10-CM | POA: Diagnosis not present

## 2016-03-25 DIAGNOSIS — M9903 Segmental and somatic dysfunction of lumbar region: Secondary | ICD-10-CM | POA: Diagnosis not present

## 2016-03-25 DIAGNOSIS — M5136 Other intervertebral disc degeneration, lumbar region: Secondary | ICD-10-CM | POA: Diagnosis not present

## 2016-03-26 DIAGNOSIS — M5136 Other intervertebral disc degeneration, lumbar region: Secondary | ICD-10-CM | POA: Diagnosis not present

## 2016-03-26 DIAGNOSIS — M9903 Segmental and somatic dysfunction of lumbar region: Secondary | ICD-10-CM | POA: Diagnosis not present

## 2016-03-27 DIAGNOSIS — M9903 Segmental and somatic dysfunction of lumbar region: Secondary | ICD-10-CM | POA: Diagnosis not present

## 2016-03-27 DIAGNOSIS — M5136 Other intervertebral disc degeneration, lumbar region: Secondary | ICD-10-CM | POA: Diagnosis not present

## 2016-03-28 DIAGNOSIS — M9903 Segmental and somatic dysfunction of lumbar region: Secondary | ICD-10-CM | POA: Diagnosis not present

## 2016-03-28 DIAGNOSIS — M5136 Other intervertebral disc degeneration, lumbar region: Secondary | ICD-10-CM | POA: Diagnosis not present

## 2016-04-01 DIAGNOSIS — M5136 Other intervertebral disc degeneration, lumbar region: Secondary | ICD-10-CM | POA: Diagnosis not present

## 2016-04-01 DIAGNOSIS — M9903 Segmental and somatic dysfunction of lumbar region: Secondary | ICD-10-CM | POA: Diagnosis not present

## 2016-04-03 DIAGNOSIS — M5136 Other intervertebral disc degeneration, lumbar region: Secondary | ICD-10-CM | POA: Diagnosis not present

## 2016-04-03 DIAGNOSIS — M9903 Segmental and somatic dysfunction of lumbar region: Secondary | ICD-10-CM | POA: Diagnosis not present

## 2016-04-04 DIAGNOSIS — M5136 Other intervertebral disc degeneration, lumbar region: Secondary | ICD-10-CM | POA: Diagnosis not present

## 2016-04-04 DIAGNOSIS — F909 Attention-deficit hyperactivity disorder, unspecified type: Secondary | ICD-10-CM | POA: Insufficient documentation

## 2016-04-04 DIAGNOSIS — M9903 Segmental and somatic dysfunction of lumbar region: Secondary | ICD-10-CM | POA: Diagnosis not present

## 2016-04-05 DIAGNOSIS — F902 Attention-deficit hyperactivity disorder, combined type: Secondary | ICD-10-CM | POA: Diagnosis not present

## 2016-04-05 DIAGNOSIS — Z6825 Body mass index (BMI) 25.0-25.9, adult: Secondary | ICD-10-CM | POA: Diagnosis not present

## 2016-04-05 DIAGNOSIS — E782 Mixed hyperlipidemia: Secondary | ICD-10-CM | POA: Diagnosis not present

## 2016-04-08 DIAGNOSIS — M5136 Other intervertebral disc degeneration, lumbar region: Secondary | ICD-10-CM | POA: Diagnosis not present

## 2016-04-08 DIAGNOSIS — M9903 Segmental and somatic dysfunction of lumbar region: Secondary | ICD-10-CM | POA: Diagnosis not present

## 2016-04-09 DIAGNOSIS — M5136 Other intervertebral disc degeneration, lumbar region: Secondary | ICD-10-CM | POA: Diagnosis not present

## 2016-04-09 DIAGNOSIS — M9903 Segmental and somatic dysfunction of lumbar region: Secondary | ICD-10-CM | POA: Diagnosis not present

## 2016-04-10 DIAGNOSIS — M5136 Other intervertebral disc degeneration, lumbar region: Secondary | ICD-10-CM | POA: Diagnosis not present

## 2016-04-10 DIAGNOSIS — M9903 Segmental and somatic dysfunction of lumbar region: Secondary | ICD-10-CM | POA: Diagnosis not present

## 2016-04-11 DIAGNOSIS — F17210 Nicotine dependence, cigarettes, uncomplicated: Secondary | ICD-10-CM | POA: Diagnosis not present

## 2016-04-11 DIAGNOSIS — E782 Mixed hyperlipidemia: Secondary | ICD-10-CM | POA: Diagnosis not present

## 2016-04-11 DIAGNOSIS — Z716 Tobacco abuse counseling: Secondary | ICD-10-CM | POA: Diagnosis not present

## 2016-04-11 DIAGNOSIS — H60502 Unspecified acute noninfective otitis externa, left ear: Secondary | ICD-10-CM | POA: Diagnosis not present

## 2016-04-12 DIAGNOSIS — H6121 Impacted cerumen, right ear: Secondary | ICD-10-CM | POA: Diagnosis not present

## 2016-04-12 DIAGNOSIS — H9201 Otalgia, right ear: Secondary | ICD-10-CM | POA: Diagnosis not present

## 2016-04-12 DIAGNOSIS — Z6826 Body mass index (BMI) 26.0-26.9, adult: Secondary | ICD-10-CM | POA: Diagnosis not present

## 2016-04-25 DIAGNOSIS — M5136 Other intervertebral disc degeneration, lumbar region: Secondary | ICD-10-CM | POA: Diagnosis not present

## 2016-04-25 DIAGNOSIS — M9903 Segmental and somatic dysfunction of lumbar region: Secondary | ICD-10-CM | POA: Diagnosis not present

## 2016-04-30 DIAGNOSIS — M5136 Other intervertebral disc degeneration, lumbar region: Secondary | ICD-10-CM | POA: Diagnosis not present

## 2016-04-30 DIAGNOSIS — M9903 Segmental and somatic dysfunction of lumbar region: Secondary | ICD-10-CM | POA: Diagnosis not present

## 2016-05-01 DIAGNOSIS — M5136 Other intervertebral disc degeneration, lumbar region: Secondary | ICD-10-CM | POA: Diagnosis not present

## 2016-05-01 DIAGNOSIS — M9903 Segmental and somatic dysfunction of lumbar region: Secondary | ICD-10-CM | POA: Diagnosis not present

## 2016-05-02 DIAGNOSIS — M5136 Other intervertebral disc degeneration, lumbar region: Secondary | ICD-10-CM | POA: Diagnosis not present

## 2016-05-02 DIAGNOSIS — M9903 Segmental and somatic dysfunction of lumbar region: Secondary | ICD-10-CM | POA: Diagnosis not present

## 2016-05-06 DIAGNOSIS — M5136 Other intervertebral disc degeneration, lumbar region: Secondary | ICD-10-CM | POA: Diagnosis not present

## 2016-05-06 DIAGNOSIS — M9903 Segmental and somatic dysfunction of lumbar region: Secondary | ICD-10-CM | POA: Diagnosis not present

## 2016-05-07 DIAGNOSIS — M5136 Other intervertebral disc degeneration, lumbar region: Secondary | ICD-10-CM | POA: Diagnosis not present

## 2016-05-07 DIAGNOSIS — M9903 Segmental and somatic dysfunction of lumbar region: Secondary | ICD-10-CM | POA: Diagnosis not present

## 2016-05-09 DIAGNOSIS — M5136 Other intervertebral disc degeneration, lumbar region: Secondary | ICD-10-CM | POA: Diagnosis not present

## 2016-05-09 DIAGNOSIS — M9903 Segmental and somatic dysfunction of lumbar region: Secondary | ICD-10-CM | POA: Diagnosis not present

## 2016-05-14 DIAGNOSIS — M5136 Other intervertebral disc degeneration, lumbar region: Secondary | ICD-10-CM | POA: Diagnosis not present

## 2016-05-14 DIAGNOSIS — M9903 Segmental and somatic dysfunction of lumbar region: Secondary | ICD-10-CM | POA: Diagnosis not present

## 2016-05-20 DIAGNOSIS — M9903 Segmental and somatic dysfunction of lumbar region: Secondary | ICD-10-CM | POA: Diagnosis not present

## 2016-05-20 DIAGNOSIS — M5136 Other intervertebral disc degeneration, lumbar region: Secondary | ICD-10-CM | POA: Diagnosis not present

## 2016-05-21 DIAGNOSIS — M5136 Other intervertebral disc degeneration, lumbar region: Secondary | ICD-10-CM | POA: Diagnosis not present

## 2016-05-21 DIAGNOSIS — M9903 Segmental and somatic dysfunction of lumbar region: Secondary | ICD-10-CM | POA: Diagnosis not present

## 2016-05-23 DIAGNOSIS — M5136 Other intervertebral disc degeneration, lumbar region: Secondary | ICD-10-CM | POA: Diagnosis not present

## 2016-05-23 DIAGNOSIS — M9903 Segmental and somatic dysfunction of lumbar region: Secondary | ICD-10-CM | POA: Diagnosis not present

## 2016-06-03 DIAGNOSIS — M5136 Other intervertebral disc degeneration, lumbar region: Secondary | ICD-10-CM | POA: Diagnosis not present

## 2016-06-03 DIAGNOSIS — M9903 Segmental and somatic dysfunction of lumbar region: Secondary | ICD-10-CM | POA: Diagnosis not present

## 2016-06-04 DIAGNOSIS — M9903 Segmental and somatic dysfunction of lumbar region: Secondary | ICD-10-CM | POA: Diagnosis not present

## 2016-06-04 DIAGNOSIS — M5136 Other intervertebral disc degeneration, lumbar region: Secondary | ICD-10-CM | POA: Diagnosis not present

## 2016-06-06 DIAGNOSIS — Z716 Tobacco abuse counseling: Secondary | ICD-10-CM | POA: Diagnosis not present

## 2016-06-06 DIAGNOSIS — F17210 Nicotine dependence, cigarettes, uncomplicated: Secondary | ICD-10-CM | POA: Diagnosis not present

## 2016-06-11 DIAGNOSIS — M9903 Segmental and somatic dysfunction of lumbar region: Secondary | ICD-10-CM | POA: Diagnosis not present

## 2016-06-11 DIAGNOSIS — M5136 Other intervertebral disc degeneration, lumbar region: Secondary | ICD-10-CM | POA: Diagnosis not present

## 2016-06-12 DIAGNOSIS — M5136 Other intervertebral disc degeneration, lumbar region: Secondary | ICD-10-CM | POA: Diagnosis not present

## 2016-06-12 DIAGNOSIS — M9903 Segmental and somatic dysfunction of lumbar region: Secondary | ICD-10-CM | POA: Diagnosis not present

## 2016-06-13 DIAGNOSIS — M5136 Other intervertebral disc degeneration, lumbar region: Secondary | ICD-10-CM | POA: Diagnosis not present

## 2016-06-13 DIAGNOSIS — M9903 Segmental and somatic dysfunction of lumbar region: Secondary | ICD-10-CM | POA: Diagnosis not present

## 2016-06-17 DIAGNOSIS — M9903 Segmental and somatic dysfunction of lumbar region: Secondary | ICD-10-CM | POA: Diagnosis not present

## 2016-06-17 DIAGNOSIS — M5136 Other intervertebral disc degeneration, lumbar region: Secondary | ICD-10-CM | POA: Diagnosis not present

## 2016-06-19 DIAGNOSIS — M5136 Other intervertebral disc degeneration, lumbar region: Secondary | ICD-10-CM | POA: Diagnosis not present

## 2016-06-19 DIAGNOSIS — M9903 Segmental and somatic dysfunction of lumbar region: Secondary | ICD-10-CM | POA: Diagnosis not present

## 2016-06-20 DIAGNOSIS — E782 Mixed hyperlipidemia: Secondary | ICD-10-CM | POA: Diagnosis not present

## 2016-06-20 DIAGNOSIS — Z1389 Encounter for screening for other disorder: Secondary | ICD-10-CM | POA: Diagnosis not present

## 2016-06-20 DIAGNOSIS — Z008 Encounter for other general examination: Secondary | ICD-10-CM | POA: Diagnosis not present

## 2016-06-20 DIAGNOSIS — Z716 Tobacco abuse counseling: Secondary | ICD-10-CM | POA: Diagnosis not present

## 2016-06-20 DIAGNOSIS — Z713 Dietary counseling and surveillance: Secondary | ICD-10-CM | POA: Diagnosis not present

## 2016-06-20 DIAGNOSIS — M9903 Segmental and somatic dysfunction of lumbar region: Secondary | ICD-10-CM | POA: Diagnosis not present

## 2016-06-20 DIAGNOSIS — M5136 Other intervertebral disc degeneration, lumbar region: Secondary | ICD-10-CM | POA: Diagnosis not present

## 2016-06-20 DIAGNOSIS — F17210 Nicotine dependence, cigarettes, uncomplicated: Secondary | ICD-10-CM | POA: Diagnosis not present

## 2016-06-24 DIAGNOSIS — M5136 Other intervertebral disc degeneration, lumbar region: Secondary | ICD-10-CM | POA: Diagnosis not present

## 2016-06-24 DIAGNOSIS — M9903 Segmental and somatic dysfunction of lumbar region: Secondary | ICD-10-CM | POA: Diagnosis not present

## 2016-06-26 DIAGNOSIS — M9903 Segmental and somatic dysfunction of lumbar region: Secondary | ICD-10-CM | POA: Diagnosis not present

## 2016-06-26 DIAGNOSIS — M5136 Other intervertebral disc degeneration, lumbar region: Secondary | ICD-10-CM | POA: Diagnosis not present

## 2016-07-01 DIAGNOSIS — M5136 Other intervertebral disc degeneration, lumbar region: Secondary | ICD-10-CM | POA: Diagnosis not present

## 2016-07-01 DIAGNOSIS — M9903 Segmental and somatic dysfunction of lumbar region: Secondary | ICD-10-CM | POA: Diagnosis not present

## 2016-07-03 DIAGNOSIS — M9903 Segmental and somatic dysfunction of lumbar region: Secondary | ICD-10-CM | POA: Diagnosis not present

## 2016-07-03 DIAGNOSIS — M5136 Other intervertebral disc degeneration, lumbar region: Secondary | ICD-10-CM | POA: Diagnosis not present

## 2016-07-04 DIAGNOSIS — M5136 Other intervertebral disc degeneration, lumbar region: Secondary | ICD-10-CM | POA: Diagnosis not present

## 2016-07-04 DIAGNOSIS — M9903 Segmental and somatic dysfunction of lumbar region: Secondary | ICD-10-CM | POA: Diagnosis not present

## 2016-07-08 DIAGNOSIS — R1013 Epigastric pain: Secondary | ICD-10-CM | POA: Insufficient documentation

## 2016-07-08 DIAGNOSIS — E782 Mixed hyperlipidemia: Secondary | ICD-10-CM | POA: Insufficient documentation

## 2016-07-09 DIAGNOSIS — Z6827 Body mass index (BMI) 27.0-27.9, adult: Secondary | ICD-10-CM | POA: Diagnosis not present

## 2016-07-09 DIAGNOSIS — K9289 Other specified diseases of the digestive system: Secondary | ICD-10-CM | POA: Diagnosis not present

## 2016-07-09 DIAGNOSIS — E782 Mixed hyperlipidemia: Secondary | ICD-10-CM | POA: Diagnosis not present

## 2016-07-09 DIAGNOSIS — R1013 Epigastric pain: Secondary | ICD-10-CM | POA: Diagnosis not present

## 2016-07-09 DIAGNOSIS — M9903 Segmental and somatic dysfunction of lumbar region: Secondary | ICD-10-CM | POA: Diagnosis not present

## 2016-07-09 DIAGNOSIS — M5136 Other intervertebral disc degeneration, lumbar region: Secondary | ICD-10-CM | POA: Diagnosis not present

## 2016-07-10 DIAGNOSIS — M5136 Other intervertebral disc degeneration, lumbar region: Secondary | ICD-10-CM | POA: Diagnosis not present

## 2016-07-10 DIAGNOSIS — M9903 Segmental and somatic dysfunction of lumbar region: Secondary | ICD-10-CM | POA: Diagnosis not present

## 2016-07-12 DIAGNOSIS — R1013 Epigastric pain: Secondary | ICD-10-CM | POA: Diagnosis not present

## 2016-07-16 DIAGNOSIS — M5136 Other intervertebral disc degeneration, lumbar region: Secondary | ICD-10-CM | POA: Diagnosis not present

## 2016-07-16 DIAGNOSIS — M9903 Segmental and somatic dysfunction of lumbar region: Secondary | ICD-10-CM | POA: Diagnosis not present

## 2016-07-18 DIAGNOSIS — F419 Anxiety disorder, unspecified: Secondary | ICD-10-CM | POA: Diagnosis not present

## 2016-07-18 DIAGNOSIS — F17210 Nicotine dependence, cigarettes, uncomplicated: Secondary | ICD-10-CM | POA: Diagnosis not present

## 2016-07-18 DIAGNOSIS — M5136 Other intervertebral disc degeneration, lumbar region: Secondary | ICD-10-CM | POA: Diagnosis not present

## 2016-07-18 DIAGNOSIS — M9903 Segmental and somatic dysfunction of lumbar region: Secondary | ICD-10-CM | POA: Diagnosis not present

## 2016-07-18 DIAGNOSIS — Z716 Tobacco abuse counseling: Secondary | ICD-10-CM | POA: Diagnosis not present

## 2016-07-21 DIAGNOSIS — I10 Essential (primary) hypertension: Secondary | ICD-10-CM | POA: Insufficient documentation

## 2016-07-22 DIAGNOSIS — Z6828 Body mass index (BMI) 28.0-28.9, adult: Secondary | ICD-10-CM | POA: Diagnosis not present

## 2016-07-22 DIAGNOSIS — I10 Essential (primary) hypertension: Secondary | ICD-10-CM | POA: Diagnosis not present

## 2016-07-23 DIAGNOSIS — M9903 Segmental and somatic dysfunction of lumbar region: Secondary | ICD-10-CM | POA: Diagnosis not present

## 2016-07-23 DIAGNOSIS — M5136 Other intervertebral disc degeneration, lumbar region: Secondary | ICD-10-CM | POA: Diagnosis not present

## 2016-07-29 DIAGNOSIS — M9903 Segmental and somatic dysfunction of lumbar region: Secondary | ICD-10-CM | POA: Diagnosis not present

## 2016-07-29 DIAGNOSIS — M5136 Other intervertebral disc degeneration, lumbar region: Secondary | ICD-10-CM | POA: Diagnosis not present

## 2016-07-31 DIAGNOSIS — M9903 Segmental and somatic dysfunction of lumbar region: Secondary | ICD-10-CM | POA: Diagnosis not present

## 2016-07-31 DIAGNOSIS — M5136 Other intervertebral disc degeneration, lumbar region: Secondary | ICD-10-CM | POA: Diagnosis not present

## 2016-08-06 DIAGNOSIS — M9903 Segmental and somatic dysfunction of lumbar region: Secondary | ICD-10-CM | POA: Diagnosis not present

## 2016-08-06 DIAGNOSIS — M5136 Other intervertebral disc degeneration, lumbar region: Secondary | ICD-10-CM | POA: Diagnosis not present

## 2016-08-08 DIAGNOSIS — M5136 Other intervertebral disc degeneration, lumbar region: Secondary | ICD-10-CM | POA: Diagnosis not present

## 2016-08-08 DIAGNOSIS — M9903 Segmental and somatic dysfunction of lumbar region: Secondary | ICD-10-CM | POA: Diagnosis not present

## 2016-08-13 DIAGNOSIS — M9903 Segmental and somatic dysfunction of lumbar region: Secondary | ICD-10-CM | POA: Diagnosis not present

## 2016-08-13 DIAGNOSIS — M5136 Other intervertebral disc degeneration, lumbar region: Secondary | ICD-10-CM | POA: Diagnosis not present

## 2016-08-15 DIAGNOSIS — M5136 Other intervertebral disc degeneration, lumbar region: Secondary | ICD-10-CM | POA: Diagnosis not present

## 2016-08-15 DIAGNOSIS — M9903 Segmental and somatic dysfunction of lumbar region: Secondary | ICD-10-CM | POA: Diagnosis not present

## 2016-08-21 DIAGNOSIS — M5136 Other intervertebral disc degeneration, lumbar region: Secondary | ICD-10-CM | POA: Diagnosis not present

## 2016-08-21 DIAGNOSIS — M9903 Segmental and somatic dysfunction of lumbar region: Secondary | ICD-10-CM | POA: Diagnosis not present

## 2016-08-26 DIAGNOSIS — M9903 Segmental and somatic dysfunction of lumbar region: Secondary | ICD-10-CM | POA: Diagnosis not present

## 2016-08-26 DIAGNOSIS — M5136 Other intervertebral disc degeneration, lumbar region: Secondary | ICD-10-CM | POA: Diagnosis not present

## 2016-09-02 DIAGNOSIS — M5136 Other intervertebral disc degeneration, lumbar region: Secondary | ICD-10-CM | POA: Diagnosis not present

## 2016-09-02 DIAGNOSIS — M9903 Segmental and somatic dysfunction of lumbar region: Secondary | ICD-10-CM | POA: Diagnosis not present

## 2016-09-04 DIAGNOSIS — M5136 Other intervertebral disc degeneration, lumbar region: Secondary | ICD-10-CM | POA: Diagnosis not present

## 2016-09-04 DIAGNOSIS — M9903 Segmental and somatic dysfunction of lumbar region: Secondary | ICD-10-CM | POA: Diagnosis not present

## 2016-09-05 DIAGNOSIS — M9903 Segmental and somatic dysfunction of lumbar region: Secondary | ICD-10-CM | POA: Diagnosis not present

## 2016-09-05 DIAGNOSIS — M5136 Other intervertebral disc degeneration, lumbar region: Secondary | ICD-10-CM | POA: Diagnosis not present

## 2016-09-16 DIAGNOSIS — M5136 Other intervertebral disc degeneration, lumbar region: Secondary | ICD-10-CM | POA: Diagnosis not present

## 2016-09-16 DIAGNOSIS — M9903 Segmental and somatic dysfunction of lumbar region: Secondary | ICD-10-CM | POA: Diagnosis not present

## 2016-09-25 DIAGNOSIS — M9903 Segmental and somatic dysfunction of lumbar region: Secondary | ICD-10-CM | POA: Diagnosis not present

## 2016-09-25 DIAGNOSIS — M5136 Other intervertebral disc degeneration, lumbar region: Secondary | ICD-10-CM | POA: Diagnosis not present

## 2016-10-07 DIAGNOSIS — M5136 Other intervertebral disc degeneration, lumbar region: Secondary | ICD-10-CM | POA: Diagnosis not present

## 2016-10-07 DIAGNOSIS — M9903 Segmental and somatic dysfunction of lumbar region: Secondary | ICD-10-CM | POA: Diagnosis not present

## 2016-10-09 DIAGNOSIS — I10 Essential (primary) hypertension: Secondary | ICD-10-CM | POA: Diagnosis not present

## 2016-10-09 DIAGNOSIS — Z6828 Body mass index (BMI) 28.0-28.9, adult: Secondary | ICD-10-CM | POA: Diagnosis not present

## 2016-10-15 DIAGNOSIS — M9903 Segmental and somatic dysfunction of lumbar region: Secondary | ICD-10-CM | POA: Diagnosis not present

## 2016-10-15 DIAGNOSIS — M5136 Other intervertebral disc degeneration, lumbar region: Secondary | ICD-10-CM | POA: Diagnosis not present

## 2016-10-17 DIAGNOSIS — F17210 Nicotine dependence, cigarettes, uncomplicated: Secondary | ICD-10-CM | POA: Diagnosis not present

## 2016-10-17 DIAGNOSIS — Z719 Counseling, unspecified: Secondary | ICD-10-CM | POA: Diagnosis not present

## 2016-10-17 DIAGNOSIS — Z716 Tobacco abuse counseling: Secondary | ICD-10-CM | POA: Diagnosis not present

## 2016-10-17 DIAGNOSIS — Z008 Encounter for other general examination: Secondary | ICD-10-CM | POA: Diagnosis not present

## 2016-10-17 DIAGNOSIS — E782 Mixed hyperlipidemia: Secondary | ICD-10-CM | POA: Diagnosis not present

## 2016-10-17 DIAGNOSIS — F419 Anxiety disorder, unspecified: Secondary | ICD-10-CM | POA: Diagnosis not present

## 2016-10-21 DIAGNOSIS — M5136 Other intervertebral disc degeneration, lumbar region: Secondary | ICD-10-CM | POA: Diagnosis not present

## 2016-10-21 DIAGNOSIS — M9903 Segmental and somatic dysfunction of lumbar region: Secondary | ICD-10-CM | POA: Diagnosis not present

## 2016-10-31 DIAGNOSIS — R030 Elevated blood-pressure reading, without diagnosis of hypertension: Secondary | ICD-10-CM | POA: Diagnosis not present

## 2016-10-31 DIAGNOSIS — F419 Anxiety disorder, unspecified: Secondary | ICD-10-CM | POA: Diagnosis not present

## 2016-11-12 DIAGNOSIS — F419 Anxiety disorder, unspecified: Secondary | ICD-10-CM | POA: Diagnosis not present

## 2016-12-05 DIAGNOSIS — F17210 Nicotine dependence, cigarettes, uncomplicated: Secondary | ICD-10-CM | POA: Diagnosis not present

## 2016-12-05 DIAGNOSIS — R030 Elevated blood-pressure reading, without diagnosis of hypertension: Secondary | ICD-10-CM | POA: Diagnosis not present

## 2016-12-05 DIAGNOSIS — Z716 Tobacco abuse counseling: Secondary | ICD-10-CM | POA: Diagnosis not present

## 2016-12-05 DIAGNOSIS — F419 Anxiety disorder, unspecified: Secondary | ICD-10-CM | POA: Diagnosis not present

## 2017-01-02 DIAGNOSIS — R030 Elevated blood-pressure reading, without diagnosis of hypertension: Secondary | ICD-10-CM | POA: Diagnosis not present

## 2017-01-02 DIAGNOSIS — Z716 Tobacco abuse counseling: Secondary | ICD-10-CM | POA: Diagnosis not present

## 2017-01-02 DIAGNOSIS — F419 Anxiety disorder, unspecified: Secondary | ICD-10-CM | POA: Diagnosis not present

## 2017-01-02 DIAGNOSIS — F17210 Nicotine dependence, cigarettes, uncomplicated: Secondary | ICD-10-CM | POA: Diagnosis not present

## 2017-01-07 DIAGNOSIS — Z1389 Encounter for screening for other disorder: Secondary | ICD-10-CM | POA: Diagnosis not present

## 2017-01-07 DIAGNOSIS — I10 Essential (primary) hypertension: Secondary | ICD-10-CM | POA: Diagnosis not present

## 2017-01-07 DIAGNOSIS — Z6828 Body mass index (BMI) 28.0-28.9, adult: Secondary | ICD-10-CM | POA: Diagnosis not present

## 2017-01-15 DIAGNOSIS — Z6828 Body mass index (BMI) 28.0-28.9, adult: Secondary | ICD-10-CM | POA: Diagnosis not present

## 2017-01-15 DIAGNOSIS — D485 Neoplasm of uncertain behavior of skin: Secondary | ICD-10-CM | POA: Diagnosis not present

## 2017-01-27 DIAGNOSIS — A630 Anogenital (venereal) warts: Secondary | ICD-10-CM | POA: Diagnosis not present

## 2017-02-06 ENCOUNTER — Ambulatory Visit (INDEPENDENT_AMBULATORY_CARE_PROVIDER_SITE_OTHER): Payer: BLUE CROSS/BLUE SHIELD | Admitting: Otolaryngology

## 2017-02-06 DIAGNOSIS — H900 Conductive hearing loss, bilateral: Secondary | ICD-10-CM

## 2017-02-06 DIAGNOSIS — H6123 Impacted cerumen, bilateral: Secondary | ICD-10-CM | POA: Diagnosis not present

## 2017-02-10 DIAGNOSIS — A630 Anogenital (venereal) warts: Secondary | ICD-10-CM | POA: Diagnosis not present

## 2017-03-24 DIAGNOSIS — J0101 Acute recurrent maxillary sinusitis: Secondary | ICD-10-CM | POA: Diagnosis not present

## 2017-03-24 DIAGNOSIS — J0100 Acute maxillary sinusitis, unspecified: Secondary | ICD-10-CM | POA: Insufficient documentation

## 2017-03-24 DIAGNOSIS — Z6828 Body mass index (BMI) 28.0-28.9, adult: Secondary | ICD-10-CM | POA: Diagnosis not present

## 2017-04-10 DIAGNOSIS — Z719 Counseling, unspecified: Secondary | ICD-10-CM | POA: Diagnosis not present

## 2017-04-10 DIAGNOSIS — F17210 Nicotine dependence, cigarettes, uncomplicated: Secondary | ICD-10-CM | POA: Diagnosis not present

## 2017-04-10 DIAGNOSIS — Z716 Tobacco abuse counseling: Secondary | ICD-10-CM | POA: Diagnosis not present

## 2017-04-10 DIAGNOSIS — Z008 Encounter for other general examination: Secondary | ICD-10-CM | POA: Diagnosis not present

## 2017-05-01 DIAGNOSIS — F17210 Nicotine dependence, cigarettes, uncomplicated: Secondary | ICD-10-CM | POA: Diagnosis not present

## 2017-05-01 DIAGNOSIS — R030 Elevated blood-pressure reading, without diagnosis of hypertension: Secondary | ICD-10-CM | POA: Diagnosis not present

## 2017-05-01 DIAGNOSIS — Z716 Tobacco abuse counseling: Secondary | ICD-10-CM | POA: Diagnosis not present

## 2017-05-01 DIAGNOSIS — Z719 Counseling, unspecified: Secondary | ICD-10-CM | POA: Diagnosis not present

## 2017-05-01 DIAGNOSIS — F419 Anxiety disorder, unspecified: Secondary | ICD-10-CM | POA: Diagnosis not present

## 2017-05-19 DIAGNOSIS — I10 Essential (primary) hypertension: Secondary | ICD-10-CM | POA: Diagnosis not present

## 2017-05-19 DIAGNOSIS — Z6828 Body mass index (BMI) 28.0-28.9, adult: Secondary | ICD-10-CM | POA: Diagnosis not present

## 2017-05-27 DIAGNOSIS — M542 Cervicalgia: Secondary | ICD-10-CM | POA: Diagnosis not present

## 2017-06-05 DIAGNOSIS — Z716 Tobacco abuse counseling: Secondary | ICD-10-CM | POA: Diagnosis not present

## 2017-06-05 DIAGNOSIS — F17210 Nicotine dependence, cigarettes, uncomplicated: Secondary | ICD-10-CM | POA: Diagnosis not present

## 2017-07-26 DIAGNOSIS — J019 Acute sinusitis, unspecified: Secondary | ICD-10-CM | POA: Diagnosis not present

## 2017-07-26 DIAGNOSIS — K5792 Diverticulitis of intestine, part unspecified, without perforation or abscess without bleeding: Secondary | ICD-10-CM | POA: Diagnosis not present

## 2017-07-26 DIAGNOSIS — K5732 Diverticulitis of large intestine without perforation or abscess without bleeding: Secondary | ICD-10-CM | POA: Insufficient documentation

## 2017-07-26 DIAGNOSIS — Z6829 Body mass index (BMI) 29.0-29.9, adult: Secondary | ICD-10-CM | POA: Diagnosis not present

## 2017-07-26 DIAGNOSIS — N390 Urinary tract infection, site not specified: Secondary | ICD-10-CM | POA: Diagnosis not present

## 2017-07-31 DIAGNOSIS — Z716 Tobacco abuse counseling: Secondary | ICD-10-CM | POA: Diagnosis not present

## 2017-07-31 DIAGNOSIS — F17210 Nicotine dependence, cigarettes, uncomplicated: Secondary | ICD-10-CM | POA: Diagnosis not present

## 2017-08-13 DIAGNOSIS — Z6828 Body mass index (BMI) 28.0-28.9, adult: Secondary | ICD-10-CM | POA: Diagnosis not present

## 2017-08-13 DIAGNOSIS — I10 Essential (primary) hypertension: Secondary | ICD-10-CM | POA: Diagnosis not present

## 2017-08-28 DIAGNOSIS — Z716 Tobacco abuse counseling: Secondary | ICD-10-CM | POA: Diagnosis not present

## 2017-08-28 DIAGNOSIS — F17210 Nicotine dependence, cigarettes, uncomplicated: Secondary | ICD-10-CM | POA: Diagnosis not present

## 2017-10-02 DIAGNOSIS — F419 Anxiety disorder, unspecified: Secondary | ICD-10-CM | POA: Diagnosis not present

## 2017-10-02 DIAGNOSIS — Z716 Tobacco abuse counseling: Secondary | ICD-10-CM | POA: Diagnosis not present

## 2017-10-02 DIAGNOSIS — F17210 Nicotine dependence, cigarettes, uncomplicated: Secondary | ICD-10-CM | POA: Diagnosis not present

## 2017-10-02 DIAGNOSIS — Z008 Encounter for other general examination: Secondary | ICD-10-CM | POA: Diagnosis not present

## 2017-11-27 DIAGNOSIS — E782 Mixed hyperlipidemia: Secondary | ICD-10-CM | POA: Diagnosis not present

## 2017-11-27 DIAGNOSIS — Z7189 Other specified counseling: Secondary | ICD-10-CM | POA: Diagnosis not present

## 2017-11-27 DIAGNOSIS — F17210 Nicotine dependence, cigarettes, uncomplicated: Secondary | ICD-10-CM | POA: Diagnosis not present

## 2017-11-27 DIAGNOSIS — E669 Obesity, unspecified: Secondary | ICD-10-CM | POA: Diagnosis not present

## 2017-11-27 DIAGNOSIS — Z719 Counseling, unspecified: Secondary | ICD-10-CM | POA: Diagnosis not present

## 2017-11-27 DIAGNOSIS — Z716 Tobacco abuse counseling: Secondary | ICD-10-CM | POA: Diagnosis not present

## 2017-12-18 DIAGNOSIS — L03818 Cellulitis of other sites: Secondary | ICD-10-CM | POA: Diagnosis not present

## 2017-12-18 DIAGNOSIS — R200 Anesthesia of skin: Secondary | ICD-10-CM | POA: Diagnosis not present

## 2017-12-24 DIAGNOSIS — H6001 Abscess of right external ear: Secondary | ICD-10-CM | POA: Diagnosis not present

## 2017-12-24 DIAGNOSIS — H6002 Abscess of left external ear: Secondary | ICD-10-CM | POA: Diagnosis not present

## 2017-12-24 DIAGNOSIS — M722 Plantar fascial fibromatosis: Secondary | ICD-10-CM | POA: Diagnosis not present

## 2017-12-24 DIAGNOSIS — H9202 Otalgia, left ear: Secondary | ICD-10-CM | POA: Diagnosis not present

## 2018-01-01 DIAGNOSIS — F17210 Nicotine dependence, cigarettes, uncomplicated: Secondary | ICD-10-CM | POA: Diagnosis not present

## 2018-01-01 DIAGNOSIS — Z716 Tobacco abuse counseling: Secondary | ICD-10-CM | POA: Diagnosis not present

## 2018-02-05 ENCOUNTER — Ambulatory Visit (INDEPENDENT_AMBULATORY_CARE_PROVIDER_SITE_OTHER): Payer: BLUE CROSS/BLUE SHIELD | Admitting: Otolaryngology

## 2018-02-12 DIAGNOSIS — Z008 Encounter for other general examination: Secondary | ICD-10-CM | POA: Diagnosis not present

## 2018-02-12 DIAGNOSIS — F419 Anxiety disorder, unspecified: Secondary | ICD-10-CM | POA: Diagnosis not present

## 2018-02-12 DIAGNOSIS — Z719 Counseling, unspecified: Secondary | ICD-10-CM | POA: Diagnosis not present

## 2018-02-12 DIAGNOSIS — F17210 Nicotine dependence, cigarettes, uncomplicated: Secondary | ICD-10-CM | POA: Diagnosis not present

## 2018-02-12 DIAGNOSIS — Z716 Tobacco abuse counseling: Secondary | ICD-10-CM | POA: Diagnosis not present

## 2018-03-09 ENCOUNTER — Ambulatory Visit (INDEPENDENT_AMBULATORY_CARE_PROVIDER_SITE_OTHER): Payer: BLUE CROSS/BLUE SHIELD | Admitting: Otolaryngology

## 2018-03-09 DIAGNOSIS — H6123 Impacted cerumen, bilateral: Secondary | ICD-10-CM

## 2018-03-11 DIAGNOSIS — M722 Plantar fascial fibromatosis: Secondary | ICD-10-CM | POA: Diagnosis not present

## 2018-03-11 DIAGNOSIS — M79672 Pain in left foot: Secondary | ICD-10-CM | POA: Diagnosis not present

## 2018-04-01 DIAGNOSIS — M722 Plantar fascial fibromatosis: Secondary | ICD-10-CM | POA: Diagnosis not present

## 2018-04-01 DIAGNOSIS — M25579 Pain in unspecified ankle and joints of unspecified foot: Secondary | ICD-10-CM | POA: Diagnosis not present

## 2018-04-01 DIAGNOSIS — M79672 Pain in left foot: Secondary | ICD-10-CM | POA: Diagnosis not present

## 2018-04-14 DIAGNOSIS — F902 Attention-deficit hyperactivity disorder, combined type: Secondary | ICD-10-CM | POA: Diagnosis not present

## 2018-04-14 DIAGNOSIS — G252 Other specified forms of tremor: Secondary | ICD-10-CM | POA: Diagnosis not present

## 2018-04-14 DIAGNOSIS — Z1331 Encounter for screening for depression: Secondary | ICD-10-CM | POA: Diagnosis not present

## 2018-04-14 DIAGNOSIS — Z6830 Body mass index (BMI) 30.0-30.9, adult: Secondary | ICD-10-CM | POA: Diagnosis not present

## 2018-04-14 DIAGNOSIS — I10 Essential (primary) hypertension: Secondary | ICD-10-CM | POA: Diagnosis not present

## 2018-04-14 DIAGNOSIS — E782 Mixed hyperlipidemia: Secondary | ICD-10-CM | POA: Diagnosis not present

## 2018-04-16 DIAGNOSIS — Z716 Tobacco abuse counseling: Secondary | ICD-10-CM | POA: Diagnosis not present

## 2018-04-16 DIAGNOSIS — F17210 Nicotine dependence, cigarettes, uncomplicated: Secondary | ICD-10-CM | POA: Diagnosis not present

## 2018-05-18 DIAGNOSIS — M722 Plantar fascial fibromatosis: Secondary | ICD-10-CM | POA: Diagnosis not present

## 2018-05-18 DIAGNOSIS — M79672 Pain in left foot: Secondary | ICD-10-CM | POA: Diagnosis not present

## 2018-05-18 DIAGNOSIS — M7732 Calcaneal spur, left foot: Secondary | ICD-10-CM | POA: Diagnosis not present

## 2018-05-19 DIAGNOSIS — Z008 Encounter for other general examination: Secondary | ICD-10-CM | POA: Diagnosis not present

## 2018-05-19 DIAGNOSIS — F419 Anxiety disorder, unspecified: Secondary | ICD-10-CM | POA: Diagnosis not present

## 2018-05-19 DIAGNOSIS — Z716 Tobacco abuse counseling: Secondary | ICD-10-CM | POA: Diagnosis not present

## 2018-05-19 DIAGNOSIS — E782 Mixed hyperlipidemia: Secondary | ICD-10-CM | POA: Diagnosis not present

## 2018-05-19 DIAGNOSIS — F17210 Nicotine dependence, cigarettes, uncomplicated: Secondary | ICD-10-CM | POA: Diagnosis not present

## 2018-05-19 DIAGNOSIS — Z1389 Encounter for screening for other disorder: Secondary | ICD-10-CM | POA: Diagnosis not present

## 2018-05-19 DIAGNOSIS — Z1331 Encounter for screening for depression: Secondary | ICD-10-CM | POA: Diagnosis not present

## 2018-05-19 DIAGNOSIS — Z719 Counseling, unspecified: Secondary | ICD-10-CM | POA: Diagnosis not present

## 2018-06-24 DIAGNOSIS — R1032 Left lower quadrant pain: Secondary | ICD-10-CM | POA: Diagnosis not present

## 2018-06-24 DIAGNOSIS — R110 Nausea: Secondary | ICD-10-CM | POA: Diagnosis not present

## 2018-06-24 DIAGNOSIS — Z6829 Body mass index (BMI) 29.0-29.9, adult: Secondary | ICD-10-CM | POA: Diagnosis not present

## 2018-06-24 DIAGNOSIS — R1012 Left upper quadrant pain: Secondary | ICD-10-CM | POA: Diagnosis not present

## 2018-06-25 DIAGNOSIS — R1032 Left lower quadrant pain: Secondary | ICD-10-CM | POA: Diagnosis not present

## 2018-06-25 DIAGNOSIS — R110 Nausea: Secondary | ICD-10-CM | POA: Diagnosis not present

## 2018-06-25 DIAGNOSIS — R1012 Left upper quadrant pain: Secondary | ICD-10-CM | POA: Diagnosis not present

## 2018-06-25 DIAGNOSIS — Z6830 Body mass index (BMI) 30.0-30.9, adult: Secondary | ICD-10-CM | POA: Diagnosis not present

## 2018-06-29 ENCOUNTER — Emergency Department (HOSPITAL_COMMUNITY): Payer: BLUE CROSS/BLUE SHIELD

## 2018-06-29 ENCOUNTER — Encounter (HOSPITAL_COMMUNITY): Payer: Self-pay

## 2018-06-29 ENCOUNTER — Emergency Department (HOSPITAL_COMMUNITY)
Admission: EM | Admit: 2018-06-29 | Discharge: 2018-06-29 | Disposition: A | Discharge status: Home / Self Care | Payer: BLUE CROSS/BLUE SHIELD | Attending: Emergency Medicine | Admitting: Emergency Medicine

## 2018-06-29 ENCOUNTER — Other Ambulatory Visit: Payer: Self-pay

## 2018-06-29 DIAGNOSIS — R1084 Generalized abdominal pain: Secondary | ICD-10-CM | POA: Insufficient documentation

## 2018-06-29 DIAGNOSIS — F17210 Nicotine dependence, cigarettes, uncomplicated: Secondary | ICD-10-CM | POA: Insufficient documentation

## 2018-06-29 DIAGNOSIS — K5730 Diverticulosis of large intestine without perforation or abscess without bleeding: Secondary | ICD-10-CM | POA: Diagnosis not present

## 2018-06-29 DIAGNOSIS — R1012 Left upper quadrant pain: Secondary | ICD-10-CM | POA: Diagnosis not present

## 2018-06-29 DIAGNOSIS — R1032 Left lower quadrant pain: Secondary | ICD-10-CM | POA: Diagnosis not present

## 2018-06-29 DIAGNOSIS — Z79899 Other long term (current) drug therapy: Secondary | ICD-10-CM | POA: Diagnosis not present

## 2018-06-29 LAB — COMPREHENSIVE METABOLIC PANEL
ALT: 120 U/L — ABNORMAL HIGH (ref 0–44)
AST: 50 U/L — AB (ref 15–41)
Albumin: 4.7 g/dL (ref 3.5–5.0)
Alkaline Phosphatase: 69 U/L (ref 38–126)
Anion gap: 11 (ref 5–15)
BUN: 16 mg/dL (ref 6–20)
CHLORIDE: 101 mmol/L (ref 98–111)
CO2: 25 mmol/L (ref 22–32)
Calcium: 9.7 mg/dL (ref 8.9–10.3)
Creatinine, Ser: 0.72 mg/dL (ref 0.61–1.24)
GFR calc non Af Amer: >60 mL/min (ref >60)
Glucose, Bld: 93 mg/dL (ref 70–99)
POTASSIUM: 3.8 mmol/L (ref 3.5–5.1)
SODIUM: 137 mmol/L (ref 135–145)
Total Bilirubin: 0.6 mg/dL (ref 0.3–1.2)
Total Protein: 8.1 g/dL (ref 6.5–8.1)

## 2018-06-29 LAB — CBC
HEMATOCRIT: 48.6 % (ref 39.0–52.0)
HEMOGLOBIN: 15.8 g/dL (ref 13.0–17.0)
MCH: 29.4 pg (ref 26.0–34.0)
MCHC: 32.5 g/dL (ref 30.0–36.0)
MCV: 90.5 fL (ref 80.0–100.0)
NRBC: 0 % (ref 0.0–0.2)
Platelets: 349 10*3/uL (ref 150–400)
RBC: 5.37 MIL/uL (ref 4.22–5.81)
RDW: 12.2 % (ref 11.5–15.5)
WBC: 7.3 10*3/uL (ref 4.0–10.5)

## 2018-06-29 LAB — URINALYSIS, ROUTINE W REFLEX MICROSCOPIC
Bilirubin Urine: NEGATIVE
GLUCOSE, UA: NEGATIVE mg/dL
Hgb urine dipstick: NEGATIVE
Ketones, ur: NEGATIVE mg/dL
LEUKOCYTE UA: NEGATIVE
NITRITE: NEGATIVE
PH: 5 (ref 5.0–8.0)
Protein, ur: NEGATIVE mg/dL
SPECIFIC GRAVITY, URINE: 1.028 (ref 1.005–1.030)

## 2018-06-29 LAB — LIPASE, BLOOD: LIPASE: 23 U/L (ref 11–51)

## 2018-06-29 MED ORDER — SODIUM CHLORIDE 0.9% FLUSH: 3.0000 mL | Freq: Once | INTRAVENOUS | Status: DC

## 2018-06-29 MED ORDER — IOHEXOL 300 MG/ML  SOLN
50.0000 mL | Freq: Once | INTRAMUSCULAR | Status: AC | PRN
  Administered 2018-06-29: 30 mL via ORAL

## 2018-06-29 MED ORDER — IOHEXOL 300 MG/ML  SOLN
100.0000 mL | Freq: Once | INTRAMUSCULAR | Status: AC | PRN
  Administered 2018-06-29: 100 mL via INTRAVENOUS

## 2018-06-29 NOTE — ED Triage Notes (Signed)
Pt presents to ED with left lower abdominal pain which started approximately 1 month ago. Pt was seen at [facility] and PCP but still unsure the cause of the pain. Pt states he is also nauseated but no vomiting or diarrhea. Pt states since last Wednesday the pain has been more of a burning type pain.

## 2018-06-29 NOTE — ED Notes (Addendum)
C/o left abdominal pain for on month, burning noted. Seen PCP last Wed and was antibiotics with no relief.  Was scheduled for CT scan and have not gotten call to schedule CT scan.  Rates pain 8/10.

## 2018-06-29 NOTE — Discharge Instructions (Signed)
The testing today is reassuring.  There may be a functional disorder causing your pain.  To determine this, follow-up with a gastroenterologist.  We have listed 1 that you can see.  Call him and get an appointment for an evaluation.

## 2018-06-29 NOTE — ED Provider Notes (Signed)
[facility] Provider Note   CSN: [id number] Arrival date & time: 06/29/18  1123    History   Chief Complaint Chief Complaint  Patient presents with  . Abdominal Pain    HPI Tyler Dean is a 33 y.o. male w/ no significant past medical history presenting with abdominal pain. He has month long history of abdominal pain being worked up by his PCP with presumed recurrent diverticulitis. He describes it as 8/10 burning sensation on left upper & lower quadrants without relief from Pepto-bismal. He states the pain worsened with change in sensation starting last Wednesday as spreading from the left side with feeling of 'warmth like heat pads are stuck to my stomach.' He denies any significant fever, chills, nausea, vomiting. He had one episode of diarrhea yesterday but states overall has been having regular bowel movements without diarrhea or constipation.     Past Medical History:  Diagnosis Date  . High cholesterol     There are no active problems to display for this patient.   Past Surgical History:  Procedure Laterality Date  . CYSTOSCOPY N/A 04/27/2013   Procedure: CYSTOSCOPY;  Surgeon: Marissa Nestle, MD;  Location: [facility];  Service: Urology;  Laterality: N/A;  . EAR CYST EXCISION N/A 04/27/2013   Procedure: EXCISION OF SEBACEOUS CYST ON PENIS AND SCROTUM;  Surgeon: Marissa Nestle, MD;  Location: [facility];  Service: Urology;  Laterality: N/A;  . PILONIDAL CYST EXCISION N/A 04/16/2013   Procedure: CYST EXCISION PILONIDAL SIMPLE;  Surgeon: Jamesetta So, MD;  Location: [facility];  Service: General;  Laterality: N/A;        Home Medications    Prior to Admission medications   Medication Sig Start Date End Date Taking? Authorizing Provider  amoxicillin-clavulanate (AUGMENTIN) 875-125 MG tablet Take 1 tablet by mouth 3 (three) times daily.    Yes [provider]  aspirin 325 MG EC tablet Take 325 mg by mouth daily as needed for pain.   Yes  [provider]  ezetimibe (ZETIA) 10 MG tablet Take 10 mg by mouth daily. 03/19/18  Yes [provider]  metoprolol succinate (TOPROL-XL) 25 MG 24 hr tablet Take 25 mg by mouth daily. 03/19/18  Yes [provider]  amphetamine-dextroamphetamine (ADDERALL) 30 MG tablet Take 30 mg by mouth every morning.    [provider]  aspirin EC 81 MG tablet Take 81-243 mg by mouth daily as needed for mild pain or moderate pain.    [provider]  ketorolac (TORADOL) 10 MG tablet Take 1 tablet (10 mg total) by mouth every 6 (six) hours as needed. Patient not taking: Reported on 06/29/2018 02/29/16   Isla Pence, MD    Family History No family history on file.  Social History Social History   Tobacco Use  . Smoking status: Current Every Day Smoker    Packs/day: 0.50    Years: 10.00    Pack years: 5.00    Types: Cigarettes  . Smokeless tobacco: Never Used  Substance Use Topics  . Alcohol use: No  . Drug use: No    Comment: this week     Allergies   Patient has no known allergies.   Review of Systems Review of Systems  Constitutional: Negative for chills, diaphoresis and fatigue.  Respiratory: Negative for cough, chest tightness and shortness of breath.   Cardiovascular: Negative for chest pain and leg swelling.  Gastrointestinal: Positive for abdominal distention and abdominal pain. Negative for blood in  stool, constipation, diarrhea, nausea and vomiting.  Genitourinary: Negative for dysuria, frequency and urgency.  Neurological: Negative for dizziness, light-headedness and numbness.    Physical Exam Updated Vital Signs BP (!) 147/98 (BP Location: Right Arm)   Pulse 95   Temp 98 F (36.7 C) (Oral)   Resp 18   Ht 5\' 11"  (1.803 m)   Wt 99.8 kg   SpO2 98%   BMI 30.68 kg/m   Physical Exam Constitutional:      General: He is not in acute distress.    Appearance: He is well-developed. He is obese.  Cardiovascular:     Rate and  Rhythm: Normal rate and regular rhythm.     Heart sounds: Normal heart sounds.  Pulmonary:     Effort: Pulmonary effort is normal.     Breath sounds: Normal breath sounds. No wheezing or rales.  Abdominal:     General: Bowel sounds are normal. There is distension. There is no abdominal bruit.     Palpations: Abdomen is soft. There is no shifting dullness, fluid wave or mass.     Tenderness: There is abdominal tenderness in the left upper quadrant and left lower quadrant. There is no right CVA tenderness, left CVA tenderness, guarding or rebound.     Hernia: No hernia is present.  Skin:    General: Skin is warm and dry.  Neurological:     General: No focal deficit present.     Mental Status: He is alert and oriented to person, place, and time.     ED Treatments / Results  Labs (all labs ordered are listed, but only abnormal results are displayed) Labs Reviewed  COMPREHENSIVE METABOLIC PANEL - Abnormal; Notable for the following components:      Result Value   AST 50 (*)    ALT 120 (*)    All other components within normal limits  LIPASE, BLOOD  CBC  URINALYSIS, ROUTINE W REFLEX MICROSCOPIC    EKG None  Radiology No results found.  Procedures Procedures (including critical care time)  Medications Ordered in ED Medications  sodium chloride flush (NS) 0.9 % injection 3 mL (has no administration in time range)  iohexol (OMNIPAQUE) 300 MG/ML solution 50 mL (30 mLs Oral Contrast Given 06/29/18 1453)  iohexol (OMNIPAQUE) 300 MG/ML solution 100 mL (100 mLs Intravenous Contrast Given 06/29/18 1452)     Initial Impression / Assessment and Plan / ED Course  I have reviewed the triage vital signs and the nursing notes.  Pertinent labs & imaging results that were available during my care of the patient were reviewed by me and considered in my medical decision making (see chart for details).    [name] presenting to the ED with chronic abdominal pain of unclear etiology. CT  abd/pelvis was ordered as outpatient per work up by PCP but patient's pain changing in quality. Currently not endorsing surgical abdomen. Will get CT Abd/pelvis to further evaluate etiology of abodminal pain      Final Clinical Impressions(s) / ED Diagnoses   Signed out to oncoming ED provider with request to follow up CT Abd/Pelvis  Final diagnoses:  None    ED Discharge Orders    None       Mosetta Anis, MD 06/29/18 1600    Elnora Morrison, MD 07/02/18 1322

## 2018-06-29 NOTE — ED Notes (Signed)
Completed oral contrast. 

## 2018-06-29 NOTE — ED Provider Notes (Signed)
Follow-up to give results.  Currently patient is comfortable.  CT abdomen pelvis normal.  No concerns from previous provider team, beyond CT.  Patient has chronic pain for 1 month.  I briefly reviewed findings.  Patient is stable and can be discharged.   Daleen Bo, MD 06/29/18 [phone]

## 2018-07-02 DIAGNOSIS — Z716 Tobacco abuse counseling: Secondary | ICD-10-CM | POA: Diagnosis not present

## 2018-07-02 DIAGNOSIS — F17210 Nicotine dependence, cigarettes, uncomplicated: Secondary | ICD-10-CM | POA: Diagnosis not present

## 2018-07-24 DIAGNOSIS — E782 Mixed hyperlipidemia: Secondary | ICD-10-CM | POA: Diagnosis not present

## 2018-07-24 DIAGNOSIS — F902 Attention-deficit hyperactivity disorder, combined type: Secondary | ICD-10-CM | POA: Diagnosis not present

## 2018-07-24 DIAGNOSIS — R1012 Left upper quadrant pain: Secondary | ICD-10-CM | POA: Diagnosis not present

## 2018-07-24 DIAGNOSIS — I10 Essential (primary) hypertension: Secondary | ICD-10-CM | POA: Diagnosis not present

## 2018-08-18 ENCOUNTER — Ambulatory Visit (INDEPENDENT_AMBULATORY_CARE_PROVIDER_SITE_OTHER): Payer: BLUE CROSS/BLUE SHIELD | Admitting: Gastroenterology

## 2018-08-18 ENCOUNTER — Encounter: Payer: Self-pay | Admitting: Gastroenterology

## 2018-08-18 ENCOUNTER — Other Ambulatory Visit: Payer: Self-pay

## 2018-08-18 DIAGNOSIS — R945 Abnormal results of liver function studies: Secondary | ICD-10-CM

## 2018-08-18 DIAGNOSIS — R1032 Left lower quadrant pain: Secondary | ICD-10-CM | POA: Diagnosis not present

## 2018-08-18 DIAGNOSIS — R7989 Other specified abnormal findings of blood chemistry: Secondary | ICD-10-CM

## 2018-08-18 MED ORDER — CIPROFLOXACIN HCL 500 MG PO TABS: 500.0000 mg | ORAL_TABLET | Freq: Two times a day (BID) | ORAL | 0 refills | Status: AC

## 2018-08-18 MED ORDER — METRONIDAZOLE 500 MG PO TABS: 500.0000 mg | ORAL_TABLET | Freq: Three times a day (TID) | ORAL | 0 refills | Status: AC

## 2018-08-18 NOTE — Patient Instructions (Signed)
Please have blood work done when you are able.   I have sent in two antibiotics to take for 5 days. Please call us when completed to let us know how you are doing.  Further recommendations to follow!  It was a pleasure to see you today. I strive to create trusting relationships with patients to provide genuine, compassionate, and quality care. I value your feedback. If you receive a survey regarding your visit,  I greatly appreciate you taking time to fill this out.   Annitta Needs, PhD, ANP-BC [facility]

## 2018-08-18 NOTE — Progress Notes (Signed)
Referring Provider: Kassie Mends, PA-C Primary Care Physician:  Jalene Mullet, PA-C  Primary GI: Dr. Gala Romney   Virtual Visit via Telephone Note Due to COVID-19, visit is conducted virtually and was requested by patient.   I connected with Tyler Dean on 08/18/18 at  2:00 PM EDT by telephone and verified that I am speaking with the correct person using two identifiers.   I discussed the limitations, risks, security and privacy concerns of performing an evaluation and management service by telephone and the availability of in person appointments. I also discussed with the patient that there may be a patient responsible charge related to this service. The patient expressed understanding and agreed to proceed.  Chief Complaint  Patient presents with  . Abdominal Pain    x 2-3 months, LLQ and can be really painful at times, feels "icy hot" inside     History of Present Illness: 33 year old male referred by [facility] due to abdominal pain. Patient also presented to the ED 06/29/2018 with unremarkable CT. Scattered diverticulosis but no acute diverticulitis. CBC normal. LFTs normal except for transaminases (AST 50, ALT 120).   For last 2-3 months has had pain in left abdomen described as a burning. Feels icy hot in stomach. LLQ pain. First time it started burning while sitting in a chair at work. Occurs about 3-4 times a week and lasts a few days. Nothing relieves pain. Sitting makes uncomfortable. BM daily. No diarrhea. No rectal bleeding. No difference with pain after BM. No bulges. No N/V. No fever/chills. No weight loss or lack of appetite. Most of time a burning. Sometimes irritated with constriction. No issues with urinating. No constipation. Currently with LLQ burning. No fever/chills. Pain would be intense.   States history of normal LFTs. No blood work since ED visit. Prescribed Augmentin around time going to hospital and had just finished. No improvement with this.  Used to take [employer] but none in a few months.   Past Medical History:  Diagnosis Date  . ADHD   . High cholesterol   . Hypertension      Past Surgical History:  Procedure Laterality Date  . CYSTOSCOPY N/A 04/27/2013   Procedure: CYSTOSCOPY;  Surgeon: Marissa Nestle, MD;  Location: [facility];  Service: Urology;  Laterality: N/A;  . EAR CYST EXCISION N/A 04/27/2013   Procedure: EXCISION OF SEBACEOUS CYST ON PENIS AND SCROTUM;  Surgeon: Marissa Nestle, MD;  Location: [facility];  Service: Urology;  Laterality: N/A;  . PILONIDAL CYST EXCISION N/A 04/16/2013   Procedure: CYST EXCISION PILONIDAL SIMPLE;  Surgeon: Jamesetta So, MD;  Location: [facility];  Service: General;  Laterality: N/A;     Current Meds  Medication Sig  . amphetamine-dextroamphetamine (ADDERALL) 30 MG tablet Take 30 mg by mouth every morning. On hold for now  . ezetimibe (ZETIA) 10 MG tablet Take 10 mg by mouth daily.  . metoprolol succinate (TOPROL-XL) 25 MG 24 hr tablet Take 25 mg by mouth daily.      Review of Systems: Gen: Denies fever, chills, anorexia. Denies fatigue, weakness, weight loss.  CV: Denies chest pain, palpitations, syncope, peripheral edema, and claudication. Resp: Denies dyspnea at rest, cough, wheezing, coughing up blood, and pleurisy. GI: see HPI Derm: Denies rash, itching, dry skin Psych: Denies depression, anxiety, memory loss, confusion. No homicidal or suicidal ideation.  Heme: Denies bruising, bleeding, and enlarged lymph nodes.  Observations/Objective: No distress. Video call with patient appropriately dressed, maintains good  eye contact, alert and oriented.   Assessment and Plan: 33 year old male with several month history of intermittent LLQ pain not associated with any changes in bowel habits, constipation, diarrhea. No other associated signs/symptoms. Unremarkable CT recently but did show diverticula without diverticulitis. With location of pain and symptoms, may have  low-grade diverticulitis. Will treat empirically with Cipro and Flagyl for short course. He is to call with an update next week.  Abnormal transaminases in ED: Recheck now.   Follow Up Instructions:    I discussed the assessment and treatment plan with the patient. The patient was provided an opportunity to ask questions and all were answered. The patient agreed with the plan and demonstrated an understanding of the instructions.   The patient was advised to call back or seek an in-person evaluation if the symptoms worsen or if the condition fails to improve as anticipated.  I provided 20 minutes of face-to-face time during this encounter via video call.   Annitta Needs, PhD, ANP-BC [facility]astroenterology

## 2018-08-21 NOTE — Progress Notes (Signed)
CC'ED TO PCP 

## 2018-08-25 ENCOUNTER — Telehealth: Payer: Self-pay

## 2018-08-25 NOTE — Telephone Encounter (Signed)
Pt called and asked if his lab orders could be faxed to his office that does blood work. [facility] is who they use. [phone] attn Shaune Pollack. Orders were faxed. They will fax results to our office when ready.

## 2018-09-03 DIAGNOSIS — E559 Vitamin D deficiency, unspecified: Secondary | ICD-10-CM | POA: Diagnosis not present

## 2018-09-03 DIAGNOSIS — R945 Abnormal results of liver function studies: Secondary | ICD-10-CM | POA: Diagnosis not present

## 2018-09-03 DIAGNOSIS — E782 Mixed hyperlipidemia: Secondary | ICD-10-CM | POA: Diagnosis not present

## 2018-09-03 DIAGNOSIS — Z79899 Other long term (current) drug therapy: Secondary | ICD-10-CM | POA: Diagnosis not present

## 2018-09-15 DIAGNOSIS — E559 Vitamin D deficiency, unspecified: Secondary | ICD-10-CM | POA: Diagnosis not present

## 2018-09-15 DIAGNOSIS — R945 Abnormal results of liver function studies: Secondary | ICD-10-CM | POA: Diagnosis not present

## 2018-09-15 DIAGNOSIS — E782 Mixed hyperlipidemia: Secondary | ICD-10-CM | POA: Diagnosis not present

## 2019-01-12 DIAGNOSIS — Z139 Encounter for screening, unspecified: Secondary | ICD-10-CM | POA: Diagnosis not present

## 2019-01-12 DIAGNOSIS — E559 Vitamin D deficiency, unspecified: Secondary | ICD-10-CM | POA: Diagnosis not present

## 2019-01-12 DIAGNOSIS — Z0130 Encounter for examination of blood pressure without abnormal findings: Secondary | ICD-10-CM | POA: Diagnosis not present

## 2019-01-12 DIAGNOSIS — E782 Mixed hyperlipidemia: Secondary | ICD-10-CM | POA: Diagnosis not present

## 2019-01-12 DIAGNOSIS — Z79899 Other long term (current) drug therapy: Secondary | ICD-10-CM | POA: Diagnosis not present

## 2019-01-12 DIAGNOSIS — R7301 Impaired fasting glucose: Secondary | ICD-10-CM | POA: Diagnosis not present

## 2019-02-10 ENCOUNTER — Other Ambulatory Visit: Payer: Self-pay

## 2019-02-10 DIAGNOSIS — Z20822 Contact with and (suspected) exposure to covid-19: Secondary | ICD-10-CM

## 2019-02-10 DIAGNOSIS — Z20828 Contact with and (suspected) exposure to other viral communicable diseases: Secondary | ICD-10-CM | POA: Diagnosis not present

## 2019-02-11 LAB — NOVEL CORONAVIRUS, NAA: SARS-CoV-2, NAA: NOT DETECTED

## 2019-02-16 DIAGNOSIS — Z23 Encounter for immunization: Secondary | ICD-10-CM | POA: Diagnosis not present

## 2019-03-15 ENCOUNTER — Other Ambulatory Visit: Payer: Self-pay

## 2019-03-15 ENCOUNTER — Ambulatory Visit (INDEPENDENT_AMBULATORY_CARE_PROVIDER_SITE_OTHER): Payer: BC Managed Care – PPO | Admitting: Otolaryngology

## 2019-03-30 DIAGNOSIS — F17210 Nicotine dependence, cigarettes, uncomplicated: Secondary | ICD-10-CM | POA: Diagnosis not present

## 2019-03-30 DIAGNOSIS — Z716 Tobacco abuse counseling: Secondary | ICD-10-CM | POA: Diagnosis not present

## 2019-03-30 DIAGNOSIS — E559 Vitamin D deficiency, unspecified: Secondary | ICD-10-CM | POA: Diagnosis not present

## 2019-03-31 DIAGNOSIS — I10 Essential (primary) hypertension: Secondary | ICD-10-CM | POA: Diagnosis not present

## 2019-03-31 DIAGNOSIS — G43909 Migraine, unspecified, not intractable, without status migrainosus: Secondary | ICD-10-CM | POA: Diagnosis not present

## 2019-03-31 DIAGNOSIS — F902 Attention-deficit hyperactivity disorder, combined type: Secondary | ICD-10-CM | POA: Diagnosis not present

## 2019-03-31 DIAGNOSIS — E782 Mixed hyperlipidemia: Secondary | ICD-10-CM | POA: Diagnosis not present

## 2019-05-05 DIAGNOSIS — E782 Mixed hyperlipidemia: Secondary | ICD-10-CM | POA: Diagnosis not present

## 2019-05-05 DIAGNOSIS — G43909 Migraine, unspecified, not intractable, without status migrainosus: Secondary | ICD-10-CM | POA: Diagnosis not present

## 2019-05-05 DIAGNOSIS — I10 Essential (primary) hypertension: Secondary | ICD-10-CM | POA: Diagnosis not present

## 2019-05-05 DIAGNOSIS — F902 Attention-deficit hyperactivity disorder, combined type: Secondary | ICD-10-CM | POA: Diagnosis not present

## 2019-05-19 HISTORY — PX: DENTAL SURGERY: SHX609

## 2019-05-24 ENCOUNTER — Other Ambulatory Visit: Payer: Self-pay

## 2019-05-24 ENCOUNTER — Encounter: Payer: Self-pay | Admitting: Neurology

## 2019-05-24 ENCOUNTER — Ambulatory Visit: Payer: BC Managed Care – PPO | Admitting: Neurology

## 2019-05-24 VITALS — BP 138/95 | HR 84 | Temp 96.9°F | Ht 72.0 in | Wt 212.0 lb

## 2019-05-24 DIAGNOSIS — G43709 Chronic migraine without aura, not intractable, without status migrainosus: Secondary | ICD-10-CM

## 2019-05-24 DIAGNOSIS — G441 Vascular headache, not elsewhere classified: Secondary | ICD-10-CM | POA: Diagnosis not present

## 2019-05-24 DIAGNOSIS — R519 Headache, unspecified: Secondary | ICD-10-CM | POA: Diagnosis not present

## 2019-05-24 DIAGNOSIS — H538 Other visual disturbances: Secondary | ICD-10-CM

## 2019-05-24 DIAGNOSIS — I10 Essential (primary) hypertension: Secondary | ICD-10-CM

## 2019-05-24 DIAGNOSIS — R400 Somnolence: Secondary | ICD-10-CM

## 2019-05-24 MED ORDER — RIZATRIPTAN BENZOATE 10 MG PO TBDP: 10.0000 mg | ORAL_TABLET | ORAL | 11 refills | Status: DC | PRN

## 2019-05-24 MED ORDER — AJOVY 225 MG/1.5ML ~~LOC~~ SOAJ: 225.0000 mg | SUBCUTANEOUS | 11 refills | Status: DC

## 2019-05-24 NOTE — Progress Notes (Signed)
[facility]    Provider:  Dr Jaynee Eagles Requesting Provider: Aggie Hacker PA Primary Care Provider:  Lavella Lemons, [place]  CC:  Leg weakness  HPI:  Tyler Dean is a 34 y.o. male here as requested by Aggie Hacker PA for migraines. I reviewed Aggie Hacker PA's notes. PMHx HTN, HLD, ADHD,migraines.   Patient was last seen March 31, 2019, multiple elevated BP readings at home and at nurses office typically in the 140/100 range although often has pulse over 100, he denied chest pain, shortness of breath, nausea, vomiting, headache, dizziness, vision changes, cholesterol was elevated and unable to take atorvastatin total 269 and LDL 209, he takes Adderall 30 mg twice daily and with marked improvement with working efficiency, also on Strattera, Lexapro, sumatriptan hand, he is on Lexapro for anxiety, for his blood pressure he was continued on metoprolol extended release 50 mg once daily, migraine headaches previously seen by Dr. Mart Piggs, increase in frequency over the last 6 months, taking lots of Goody's, they started a trial of Imitrex and referred him here.  He has had migraines since a child, even smelling pizza as a child would trigger migraines. They are behind his eye, start in the temple area, light and sound and smell sensitivity, pulsating/pounding/throbbing,nausea, no vomiting but feels like it, he just wants to lay down still in the dark. At least 16 migraine days a month. He wakes up with them. He wakes up with headaches quite frequently, he wakes with dry mouth. He has uncontrolled HTN. He tried Imitrex. He did not like the imitrex. Ongoing like this all year long or longer maybe even all his life. Strong family history. His vision gets blurry with the migraines, not exertional. No other focal neurologic deficits, associated symptoms, inciting events or modifiable factors.  Meds tried: Diltiazem, metoprolol, escitalopram  Reviewed labs, cbc normal, cmp 06/2018 AST 50 and  ALT 120 (he is aware and following with pcp)   Review of Systems: Patient complains of symptoms per HPI as well as the following symptoms:HTN. Pertinent negatives and positives per HPI. All others negative.   Social History   Socioeconomic History  . Marital status: Significant Other    Spouse name: Not on file  . Number of children: 1  . Years of education: Not on file  . Highest education level: Associate degree: academic program  Occupational History  . Occupation: [employer]  Tobacco Use  . Smoking status: Current Every Day Smoker    Packs/day: 1.00    Years: 10.00    Pack years: 10.00    Types: Cigarettes  . Smokeless tobacco: Never Used  Substance and Sexual Activity  . Alcohol use: Never  . Drug use: Never  . Sexual activity: Yes    Birth control/protection: None  Other Topics Concern  . Not on file  Social History Narrative   Lives at home with his child   Right handed   Caffeine: 2 cups coffee/day, 1-2 cups soda/day   Social Determinants of Health   Financial Resource Strain:   . Difficulty of Paying Living Expenses: Not on file  Food Insecurity:   . Worried About Charity fundraiser in the Last Year: Not on file  . Ran Out of Food in the Last Year: Not on file  Transportation Needs:   . Lack of Transportation (Medical): Not on file  . Lack of Transportation (Non-Medical): Not on file  Physical Activity:   . Days of Exercise per Week: Not on file  .  Minutes of Exercise per Session: Not on file  Stress:   . Feeling of Stress : Not on file  Social Connections:   . Frequency of Communication with Friends and Family: Not on file  . Frequency of Social Gatherings with Friends and Family: Not on file  . Attends Religious Services: Not on file  . Active Member of Clubs or Organizations: Not on file  . Attends Archivist Meetings: Not on file  . Marital Status: Not on file  Intimate Partner Violence:   . Fear of Current or Ex-Partner: Not on file  .  Emotionally Abused: Not on file  . Physically Abused: Not on file  . Sexually Abused: Not on file    Family History  Problem Relation Age of Onset  . Heart disease Mother   . Migraines Mother   . Cancer Father   . Migraines Brother   . Heart disease Maternal Grandmother   . Migraines Maternal Grandmother   . Heart disease Maternal Grandfather   . Migraines Maternal Uncle   . Migraines Maternal Uncle   . Colon cancer Neg Hx        grandmother with colon cancer  . Colon polyps Neg Hx     Past Medical History:  Diagnosis Date  . ADHD   . High cholesterol   . Hypertension   . Migraines     Patient Active Problem List   Diagnosis Date Noted  . Chronic migraine without aura without status migrainosus, not intractable 05/24/2019  . Morning headache 05/24/2019  . Elevated LFTs 08/18/2018  . LLQ pain 08/18/2018    Past Surgical History:  Procedure Laterality Date  . CYSTOSCOPY N/A 04/27/2013   Procedure: CYSTOSCOPY;  Surgeon: Marissa Nestle, MD;  Location: [facility];  Service: Urology;  Laterality: N/A;  . DENTAL SURGERY  05/19/2019  . EAR CYST EXCISION N/A 04/27/2013   Procedure: EXCISION OF SEBACEOUS CYST ON PENIS AND SCROTUM;  Surgeon: Marissa Nestle, MD;  Location: [facility];  Service: Urology;  Laterality: N/A;  . PILONIDAL CYST EXCISION N/A 04/16/2013   Procedure: CYST EXCISION PILONIDAL SIMPLE;  Surgeon: Jamesetta So, MD;  Location: [facility];  Service: General;  Laterality: N/A;    Current Outpatient Medications  Medication Sig Dispense Refill  . amoxicillin (AMOXIL) 500 MG capsule Take 500 mg by mouth 3 (three) times daily.    Marland Kitchen diltiazem (CARDIZEM LA) 180 MG 24 hr tablet Take 180 mg by mouth daily.    Marland Kitchen ezetimibe (ZETIA) 10 MG tablet Take 10 mg by mouth daily.    . Vitamin D, Ergocalciferol, (DRISDOL) 1.25 MG (50000 UNIT) CAPS capsule Take 50,000 Units by mouth once a week.    Marland Kitchen amphetamine-dextroamphetamine (ADDERALL) 30 MG tablet Take 30 mg by mouth every  morning. On hold for now    . Fremanezumab-vfrm (AJOVY) 225 MG/1.5ML SOAJ Inject 225 mg into the skin every 30 (thirty) days. 1 pen 11  . rizatriptan (MAXALT-MLT) 10 MG disintegrating tablet Take 1 tablet (10 mg total) by mouth as needed for migraine. May repeat in 2 hours if needed 9 tablet 11   No current facility-administered medications for this visit.    Allergies as of 05/24/2019  . (No Known Allergies)    Vitals: BP (!) 138/95 (BP Location: Left Arm, Patient Position: Sitting)   Pulse 84   Temp (!) 96.9 F (36.1 C) Comment: taken at front  Ht 6' (1.829 m)   Wt 212 lb (96.2 kg)  BMI 28.75 kg/m  Last Weight:  Wt Readings from Last 1 Encounters:  05/24/19 212 lb (96.2 kg)   Last Height:   Ht Readings from Last 1 Encounters:  05/24/19 6' (1.829 m)     Physical exam: Exam: Gen: NAD, conversant, well nourised, obese, well groomed                     CV: RRR, no MRG. No Carotid Bruits. No peripheral edema, warm, nontender Eyes: Conjunctivae clear without exudates or hemorrhage  Neuro: Detailed Neurologic Exam  Speech:    Speech is normal; fluent and spontaneous with normal comprehension.  Cognition:    The patient is oriented to person, place, and time;     recent and remote memory intact;     language fluent;     normal attention, concentration,     fund of knowledge Cranial Nerves:    The pupils are equal, round, and reactive to light. The fundi are normal and spontaneous venous pulsations are present. Visual fields are full to finger confrontation. Extraocular movements are intact. Trigeminal sensation is intact and the muscles of mastication are normal. The face is symmetric. The palate elevates in the midline. Hearing intact. Voice is normal. Shoulder shrug is normal. The tongue has normal motion without fasciculations.   Coordination:    Normal finger to nose and heel to shin. Normal rapid alternating movements.   Gait:    Heel-toe and tandem gait are  normal.   Motor Observation:    No asymmetry, no atrophy, and no involuntary movements noted. Tone:    Normal muscle tone.    Posture:    Posture is normal. normal erect    Strength:    Strength is V/V in the upper and lower limbs.      Sensation: intact to LT     Reflex Exam:  DTR's:    Deep tendon reflexes in the upper and lower extremities are normal bilaterally.   Toes:    The toes are downgoing bilaterally.   Clonus:    Clonus is absent.    Assessment/Plan: This is a really nice 34 year old gentleman who is here for chronic migraines, he has a history of migraines going back to childhood and a very strong family history of migraines.    - Patient today is interested in the injectables, specifically Aimovig, but I will start him on Ajovy given he has uncontrolled blood pressure.    -Patient has morning headaches and this is always an indication for sleep study especially since patient has uncontrolled hypertension and obesity which can be a sequelae of untreated sleep apnea, he has quite a large neck.  In addition untreated sleep apnea can be a risk factor for stroke in this patient is a daily smoker with uncontrolled hypertension, obesity and I feel a sleep evaluation with one of our sleep doctors would be prudent. ESS only 4 but FSS 43 and patient says he does not fall asleep but he could sleep "all day" if he was able to.   -Patient has morning headaches and blurry vision, I do recommend MRI of the brain with and without contrast.MRI brain due to concerning symptoms of morning headaches, vision changes  to look for space occupying mass, chiari or intracranial hypertension (pseudotumor).  -discussed medication overuse, do not use OTC meds more than 2x per week, stop the Goodys  -maxalt acutely   Discussed: To prevent or relieve headaches, try the following: Cool Compress. Lie down and  place a cool compress on your head.  Avoid headache triggers. If certain foods or odors  seem to have triggered your migraines in the past, avoid them. A headache diary might help you identify triggers.  Include physical activity in your daily routine. Try a daily walk or other moderate aerobic exercise.  Manage stress. Find healthy ways to cope with the stressors, such as delegating tasks on your to-do list.  Practice relaxation techniques. Try deep breathing, yoga, massage and visualization.  Eat regularly. Eating regularly scheduled meals and maintaining a healthy diet might help prevent headaches. Also, drink plenty of fluids.  Follow a regular sleep schedule. Sleep deprivation might contribute to headaches Consider biofeedback. With this mind-body technique, you learn to control certain bodily functions - such as muscle tension, heart rate and blood pressure - to prevent headaches or reduce headache pain.    Proceed to emergency room if you experience new or worsening symptoms or symptoms do not resolve, if you have new neurologic symptoms or if headache is severe, or for any concerning symptom.   Provided education and documentation from American headache Society toolbox including articles on: chronic migraine medication overuse headache, chronic migraines, prevention of migraines, behavioral and other nonpharmacologic treatments for headache.   Orders Placed This Encounter  Procedures  . MR BRAIN W WO CONTRAST  . Basic Metabolic Panel  . CBC  . TSH  . Ambulatory referral to Sleep Studies   Meds ordered this encounter  Medications  . Fremanezumab-vfrm (AJOVY) 225 MG/1.5ML SOAJ    Sig: Inject 225 mg into the skin every 30 (thirty) days.    Dispense:  1 pen    Refill:  11  . rizatriptan (MAXALT-MLT) 10 MG disintegrating tablet    Sig: Take 1 tablet (10 mg total) by mouth as needed for migraine. May repeat in 2 hours if needed    Dispense:  9 tablet    Refill:  11    Cc: Riley Lam,  Aggie Hacker PA  Sarina Ill, MD  [facility] [address] [place], [place] [address]  Phone [phone] Fax [phone]

## 2019-05-24 NOTE — Patient Instructions (Addendum)
MRI brain Bloodwork today Start Ajovy monthly (preventative) Maxalt(Rizatriptan acutely: Please take one tablet at the onset of your headache. If it does not improve the symptoms please take one additional tablet. Do not take more then 2 tablets in 24hrs. Do not take use more then 2 to 3 times in a week. Referral to sleep doctor - sleep study?    Sleep Apnea Sleep apnea is a condition in which breathing pauses or becomes shallow during sleep. Episodes of sleep apnea usually last 10 seconds or longer, and they may occur as many as 20 times an hour. Sleep apnea disrupts your sleep and keeps your body from getting the rest that it needs. This condition can increase your risk of certain health problems, including:  Heart attack.  Stroke.  Obesity.  Diabetes.  Heart failure.  Irregular heartbeat. What are the causes? There are three kinds of sleep apnea:  Obstructive sleep apnea. This kind is caused by a blocked or collapsed airway.  Central sleep apnea. This kind happens when the part of the brain that controls breathing does not send the correct signals to the muscles that control breathing.  Mixed sleep apnea. This is a combination of obstructive and central sleep apnea. The most common cause of this condition is a collapsed or blocked airway. An airway can collapse or become blocked if:  Your throat muscles are abnormally relaxed.  Your tongue and tonsils are larger than normal.  You are overweight.  Your airway is smaller than normal. What increases the risk? You are more likely to develop this condition if you:  Are overweight.  Smoke.  Have a smaller than normal airway.  Are elderly.  Are male.  Drink alcohol.  Take sedatives or tranquilizers.  Have a family history of sleep apnea. What are the signs or symptoms? Symptoms of this condition include:  Trouble staying asleep.  Daytime sleepiness and tiredness.  Irritability.  Loud snoring.  Morning  headaches.  Trouble concentrating.  Forgetfulness.  Decreased interest in sex.  Unexplained sleepiness.  Mood swings.  Personality changes.  Feelings of depression.  Waking up often during the night to urinate.  Dry mouth.  Sore throat. How is this diagnosed? This condition may be diagnosed with:  A medical history.  A physical exam.  A series of tests that are done while you are sleeping (sleep study). These tests are usually done in a sleep lab, but they may also be done at home. How is this treated? Treatment for this condition aims to restore normal breathing and to ease symptoms during sleep. It may involve managing health issues that can affect breathing, such as high blood pressure or obesity. Treatment may include:  Sleeping on your side.  Using a decongestant if you have nasal congestion.  Avoiding the use of depressants, including alcohol, sedatives, and narcotics.  Losing weight if you are overweight.  Making changes to your diet.  Quitting smoking.  Using a device to open your airway while you sleep, such as: ? An oral appliance. This is a custom-made mouthpiece that shifts your lower jaw forward. ? A continuous positive airway pressure (CPAP) device. This device blows air through a mask when you breathe out (exhale). ? A nasal expiratory positive airway pressure (EPAP) device. This device has valves that you put into each nostril. ? A bi-level positive airway pressure (BPAP) device. This device blows air through a mask when you breathe in (inhale) and breathe out (exhale).  Having surgery if other treatments do  not work. During surgery, excess tissue is removed to create a wider airway. It is important to get treatment for sleep apnea. Without treatment, this condition can lead to:  High blood pressure.  Coronary artery disease.  In men, an inability to achieve or maintain an erection (impotence).  Reduced thinking abilities. Follow these  instructions at home: Lifestyle  Make any lifestyle changes that your health care provider recommends.  Eat a healthy, well-balanced diet.  Take steps to lose weight if you are overweight.  Avoid using depressants, including alcohol, sedatives, and narcotics.  Do not use any products that contain nicotine or tobacco, such as cigarettes, e-cigarettes, and chewing tobacco. If you need help quitting, ask your health care provider. General instructions  Take over-the-counter and prescription medicines only as told by your health care provider.  If you were given a device to open your airway while you sleep, use it only as told by your health care provider.  If you are having surgery, make sure to tell your health care provider you have sleep apnea. You may need to bring your device with you.  Keep all follow-up visits as told by your health care provider. This is important. Contact a health care provider if:  The device that you received to open your airway during sleep is uncomfortable or does not seem to be working.  Your symptoms do not improve.  Your symptoms get worse. Get help right away if:  You develop: ? Chest pain. ? Shortness of breath. ? Discomfort in your back, arms, or stomach.  You have: ? Trouble speaking. ? Weakness on one side of your body. ? Drooping in your face. These symptoms may represent a serious problem that is an emergency. Do not wait to see if the symptoms will go away. Get medical help right away. Call your local emergency services (911 in the [place] Do not drive yourself to the hospital. Summary  Sleep apnea is a condition in which breathing pauses or becomes shallow during sleep.  The most common cause is a collapsed or blocked airway.  The goal of treatment is to restore normal breathing and to ease symptoms during sleep. This information is not intended to replace advice given to you by your health care provider. Make sure you discuss any  questions you have with your health care provider. Document Revised: 09/30/2018 Document Reviewed: 12/09/2017 Elsevier Patient Education  2020 [employer] injection What is this medicine? FREMANEZUMAB (fre ma NEZ ue mab) is used to prevent migraine headaches. This medicine may be used for other purposes; ask your health care provider or pharmacist if you have questions. COMMON BRAND NAME(S): AJOVY What should I tell my health care provider before I take this medicine? They need to know if you have any of these conditions:  an unusual or allergic reaction to fremanezumab, other medicines, foods, dyes, or preservatives  pregnant or trying to get pregnant  breast-feeding How should I use this medicine? This medicine is for injection under the skin. You will be taught how to prepare and give this medicine. Use exactly as directed. Take your medicine at regular intervals. Do not take your medicine more often than directed. It is important that you put your used needles and syringes in a special sharps container. Do not put them in a trash can. If you do not have a sharps container, call your pharmacist or healthcare provider to get one. Talk to your pediatrician regarding the use of this medicine  in children. Special care may be needed. Overdosage: If you think you have taken too much of this medicine contact a poison control center or emergency room at once. NOTE: This medicine is only for you. Do not share this medicine with others. What if I miss a dose? If you miss a dose, take it as soon as you can. If it is almost time for your next dose, take only that dose. Do not take double or extra doses. What may interact with this medicine? Interactions are not expected. This list may not describe all possible interactions. Give your health care provider a list of all the medicines, herbs, non-prescription drugs, or dietary supplements you use. Also tell them if you smoke, drink  alcohol, or use illegal drugs. Some items may interact with your medicine. What should I watch for while using this medicine? Tell your doctor or healthcare professional if your symptoms do not start to get better or if they get worse. What side effects may I notice from receiving this medicine? Side effects that you should report to your doctor or health care professional as soon as possible:  allergic reactions like skin rash, itching or hives, swelling of the face, lips, or tongue Side effects that usually do not require medical attention (report these to your doctor or health care professional if they continue or are bothersome):  pain, redness, or irritation at site where injected This list may not describe all possible side effects. Call your doctor for medical advice about side effects. You may report side effects to FDA at 1-800-FDA-1088. Where should I keep my medicine? Keep out of the reach of children. You will be instructed on how to store this medicine. Throw away any unused medicine after the expiration date on the label. NOTE: This sheet is a summary. It may not cover all possible information. If you have questions about this medicine, talk to your doctor, pharmacist, or health care provider.  2020 Elsevier/Gold Standard (2017-01-13 17:22:56)  Rizatriptan tablets What is this medicine? RIZATRIPTAN (rye za TRIP tan) is used to treat migraines with or without aura. An aura is a strange feeling or visual disturbance that warns you of an attack. It is not used to prevent migraines. This medicine may be used for other purposes; ask your health care provider or pharmacist if you have questions. COMMON BRAND NAME(S): Maxalt What should I tell my health care provider before I take this medicine? They need to know if you have any of these conditions:  cigarette smoker  circulation problems in fingers and toes  diabetes  heart disease  high blood pressure  high  cholesterol  history of irregular heartbeat  history of stroke  kidney disease  liver disease  stomach or intestine problems  an unusual or allergic reaction to rizatriptan, other medicines, foods, dyes, or preservatives  pregnant or trying to get pregnant  breast-feeding How should I use this medicine? Take this medicine by mouth with a glass of water. Follow the directions on the prescription label. Do not take it more often than directed. Talk to your pediatrician regarding the use of this medicine in children. While this drug may be prescribed for children as young as 6 years for selected conditions, precautions do apply. Overdosage: If you think you have taken too much of this medicine contact a poison control center or emergency room at once. NOTE: This medicine is only for you. Do not share this medicine with others. What if I miss a dose?  This does not apply. This medicine is not for regular use. What may interact with this medicine? Do not take this medicine with any of the following medicines:  certain medicines for migraine headache like almotriptan, eletriptan, frovatriptan, naratriptan, rizatriptan, sumatriptan, zolmitriptan  ergot alkaloids like dihydroergotamine, ergonovine, ergotamine, methylergonovine  MAOIs like Carbex, Eldepryl, Marplan, Nardil, and Parnate This medicine may also interact with the following medications:  certain medicines for depression, anxiety, or psychotic disorders  propranolol This list may not describe all possible interactions. Give your health care provider a list of all the medicines, herbs, non-prescription drugs, or dietary supplements you use. Also tell them if you smoke, drink alcohol, or use illegal drugs. Some items may interact with your medicine. What should I watch for while using this medicine? Visit your healthcare professional for regular checks on your progress. Tell your healthcare professional if your symptoms do not  start to get better or if they get worse. You may get drowsy or dizzy. Do not drive, use machinery, or do anything that needs mental alertness until you know how this medicine affects you. Do not stand up or sit up quickly, especially if you are an older patient. This reduces the risk of dizzy or fainting spells. Alcohol may interfere with the effect of this medicine. Your mouth may get dry. Chewing sugarless gum or sucking hard candy and drinking plenty of water may help. Contact your healthcare professional if the problem does not go away or is severe. If you take migraine medicines for 10 or more days a month, your migraines may get worse. Keep a diary of headache days and medicine use. Contact your healthcare professional if your migraine attacks occur more frequently. What side effects may I notice from receiving this medicine? Side effects that you should report to your doctor or health care professional as soon as possible:  allergic reactions like skin rash, itching or hives, swelling of the face, lips, or tongue  chest pain or chest tightness  signs and symptoms of a dangerous change in heartbeat or heart rhythm like chest pain; dizziness; fast, irregular heartbeat; palpitations; feeling faint or lightheaded; falls; breathing problems  signs and symptoms of a stroke like changes in vision; confusion; trouble speaking or understanding; severe headaches; sudden numbness or weakness of the face, arm or leg; trouble walking; dizziness; loss of balance or coordination  signs and symptoms of serotonin syndrome like irritable; confusion; diarrhea; fast or irregular heartbeat; muscle twitching; stiff muscles; trouble walking; sweating; high fever; seizures; chills; vomiting Side effects that usually do not require medical attention (report to your doctor or health care professional if they continue or are bothersome):  diarrhea  dizziness  drowsiness  dry mouth  headache  nausea,  vomiting  pain, tingling, numbness in the hands or feet  stomach pain This list may not describe all possible side effects. Call your doctor for medical advice about side effects. You may report side effects to FDA at 1-800-FDA-1088. Where should I keep my medicine? Keep out of the reach of children. Store at room temperature between 15 and 30 degrees C (59 and 86 degrees F). Keep container tightly closed. Throw away any unused medicine after the expiration date. NOTE: This sheet is a summary. It may not cover all possible information. If you have questions about this medicine, talk to your doctor, pharmacist, or health care provider.  2020 Elsevier/Gold Standard (2017-10-28 14:59:59)

## 2019-08-11 DIAGNOSIS — I10 Essential (primary) hypertension: Secondary | ICD-10-CM | POA: Diagnosis not present

## 2019-08-11 DIAGNOSIS — F902 Attention-deficit hyperactivity disorder, combined type: Secondary | ICD-10-CM | POA: Diagnosis not present

## 2019-08-11 DIAGNOSIS — E782 Mixed hyperlipidemia: Secondary | ICD-10-CM | POA: Diagnosis not present

## 2019-08-11 DIAGNOSIS — G43909 Migraine, unspecified, not intractable, without status migrainosus: Secondary | ICD-10-CM | POA: Diagnosis not present

## 2019-08-19 DIAGNOSIS — R300 Dysuria: Secondary | ICD-10-CM | POA: Diagnosis not present

## 2019-08-19 DIAGNOSIS — Z6829 Body mass index (BMI) 29.0-29.9, adult: Secondary | ICD-10-CM | POA: Diagnosis not present

## 2019-08-19 DIAGNOSIS — N390 Urinary tract infection, site not specified: Secondary | ICD-10-CM | POA: Diagnosis not present

## 2019-08-30 ENCOUNTER — Encounter (HOSPITAL_COMMUNITY): Payer: Self-pay | Admitting: *Deleted

## 2019-08-30 ENCOUNTER — Emergency Department (HOSPITAL_COMMUNITY): Payer: BC Managed Care – PPO

## 2019-08-30 ENCOUNTER — Other Ambulatory Visit: Payer: Self-pay

## 2019-08-30 ENCOUNTER — Emergency Department (HOSPITAL_COMMUNITY)
Admission: EM | Admit: 2019-08-30 | Discharge: 2019-08-30 | Disposition: A | Discharge status: Home / Self Care | Payer: BC Managed Care – PPO | Attending: Emergency Medicine | Admitting: Emergency Medicine

## 2019-08-30 DIAGNOSIS — F17210 Nicotine dependence, cigarettes, uncomplicated: Secondary | ICD-10-CM | POA: Diagnosis not present

## 2019-08-30 DIAGNOSIS — H538 Other visual disturbances: Secondary | ICD-10-CM | POA: Diagnosis not present

## 2019-08-30 DIAGNOSIS — R1031 Right lower quadrant pain: Secondary | ICD-10-CM | POA: Insufficient documentation

## 2019-08-30 DIAGNOSIS — Z79899 Other long term (current) drug therapy: Secondary | ICD-10-CM | POA: Diagnosis not present

## 2019-08-30 DIAGNOSIS — R519 Headache, unspecified: Secondary | ICD-10-CM | POA: Diagnosis not present

## 2019-08-30 DIAGNOSIS — N390 Urinary tract infection, site not specified: Secondary | ICD-10-CM | POA: Diagnosis not present

## 2019-08-30 DIAGNOSIS — I10 Essential (primary) hypertension: Secondary | ICD-10-CM | POA: Diagnosis not present

## 2019-08-30 DIAGNOSIS — R300 Dysuria: Secondary | ICD-10-CM | POA: Diagnosis not present

## 2019-08-30 LAB — CBC WITH DIFFERENTIAL/PLATELET
Abs Immature Granulocytes: 0.03 10*3/uL (ref 0.00–0.07)
Basophils Absolute: 0.1 10*3/uL (ref 0.0–0.1)
Basophils Relative: 1 %
Eosinophils Absolute: 0.4 10*3/uL (ref 0.0–0.5)
Eosinophils Relative: 4 %
HCT: 46.2 % (ref 39.0–52.0)
Hemoglobin: 15.4 g/dL (ref 13.0–17.0)
Immature Granulocytes: 0 %
Lymphocytes Relative: 39 %
Lymphs Abs: 3.5 10*3/uL (ref 0.7–4.0)
MCH: 31.2 pg (ref 26.0–34.0)
MCHC: 33.3 g/dL (ref 30.0–36.0)
MCV: 93.5 fL (ref 80.0–100.0)
Monocytes Absolute: 0.7 10*3/uL (ref 0.1–1.0)
Monocytes Relative: 8 %
Neutro Abs: 4.3 10*3/uL (ref 1.7–7.7)
Neutrophils Relative %: 48 %
Platelets: 317 10*3/uL (ref 150–400)
RBC: 4.94 MIL/uL (ref 4.22–5.81)
RDW: 11.9 % (ref 11.5–15.5)
WBC: 8.9 10*3/uL (ref 4.0–10.5)
nRBC: 0 % (ref 0.0–0.2)

## 2019-08-30 LAB — URINALYSIS, ROUTINE W REFLEX MICROSCOPIC
Bilirubin Urine: NEGATIVE
Glucose, UA: NEGATIVE mg/dL
Hgb urine dipstick: NEGATIVE
Ketones, ur: NEGATIVE mg/dL
Leukocytes,Ua: NEGATIVE
Nitrite: NEGATIVE
Protein, ur: NEGATIVE mg/dL
Specific Gravity, Urine: 1.027 (ref 1.005–1.030)
pH: 5 (ref 5.0–8.0)

## 2019-08-30 LAB — COMPREHENSIVE METABOLIC PANEL
ALT: 45 U/L — ABNORMAL HIGH (ref 0–44)
AST: 27 U/L (ref 15–41)
Albumin: 4.9 g/dL (ref 3.5–5.0)
Alkaline Phosphatase: 69 U/L (ref 38–126)
Anion gap: 12 (ref 5–15)
BUN: 15 mg/dL (ref 6–20)
CO2: 24 mmol/L (ref 22–32)
Calcium: 9 mg/dL (ref 8.9–10.3)
Chloride: 103 mmol/L (ref 98–111)
Creatinine, Ser: 0.84 mg/dL (ref 0.61–1.24)
GFR calc Af Amer: >60 mL/min (ref >60)
GFR calc non Af Amer: >60 mL/min (ref >60)
Glucose, Bld: 78 mg/dL (ref 70–99)
Potassium: 3.5 mmol/L (ref 3.5–5.1)
Sodium: 139 mmol/L (ref 135–145)
Total Bilirubin: 0.6 mg/dL (ref 0.3–1.2)
Total Protein: 8.1 g/dL (ref 6.5–8.1)

## 2019-08-30 LAB — LIPASE, BLOOD: Lipase: 31 U/L (ref 11–51)

## 2019-08-30 MED ORDER — FLUORESCEIN SODIUM 1 MG OP STRP
1.0000 | ORAL_STRIP | Freq: Once | OPHTHALMIC | Status: AC
  Administered 2019-08-30: 21:00:00 1 via OPHTHALMIC
  Filled 2019-08-30: qty 1

## 2019-08-30 MED ORDER — TETRACAINE HCL 0.5 % OP SOLN
1.0000 [drp] | Freq: Once | OPHTHALMIC | Status: AC
  Administered 2019-08-30: 21:00:00 1 [drp] via OPHTHALMIC
  Filled 2019-08-30: qty 4

## 2019-08-30 MED ORDER — SODIUM CHLORIDE 0.9 % IV BOLUS
1000.0000 mL | Freq: Once | INTRAVENOUS | Status: AC
  Administered 2019-08-30: 19:00:00 1000 mL via INTRAVENOUS

## 2019-08-30 MED ORDER — IOHEXOL 300 MG/ML  SOLN
100.0000 mL | Freq: Once | INTRAMUSCULAR | Status: AC | PRN
  Administered 2019-08-30: 19:00:00 100 mL via INTRAVENOUS

## 2019-08-30 NOTE — ED Notes (Signed)
Pt. With CT. 

## 2019-08-30 NOTE — Discharge Instructions (Signed)
Your blood work and urine today did not show any signs of infection or any abnormalities.  We have tested you for gonorrhea and chlamydia.  This does not come back for 2 to 3 days.  If you are positive, you will be notified.  Your CT scan of your head and abdomen were reassuring did not show any acute abnormalities.  We have provided you a referral to urology for your continued pain with urination.  Additionally, we have provided you a referral to neurology for your blurred vision.  Return the emergency department for any fever, vomiting, worsening abdominal pain, difficulty walking or any other worsening concerning symptoms.

## 2019-08-30 NOTE — ED Provider Notes (Signed)
[facility] EMERGENCY DEPARTMENT Provider Note   CSN: [id number] Arrival date & time: 08/30/19  1353     History Chief Complaint  Patient presents with  . Blurred Vision    Tyler Dean is a 34 y.o. male past medical history ADHD, high cholesterol, hypertension, migraines who presents for evaluation of multiple complaints. (1) he is here for some continued and persistent dysuria.  He reports that about 3 weeks ago, he started noticing some dysuria.  He was seen by a doctor and prescribed Cipro x10 days which he states he has taken all of.  He states that despite finishing his medications a few days ago, he continues to have symptoms.  No hematuria.  He states that he has not noticed any penile discharge, testicular pain or swelling.  He has not noted any hematuria.  He is currently sexually active with 1 partner and they ended they sometimes use protection.  He has never had STDs.  (2) he also reports that over the last 2 weeks, he has had intermittent right lower quadrant abdominal pain.  He states that the pain occurs randomly and is not associated with urination or with eating.  He states that he continues to have the pain and states that sometimes it radiates down to his right testicle.  He states he has had some nausea but has not had any vomiting.  He felt some subjective fevers but has not measured any fever at home. (3) he also reports that over the last 2 weeks, he has had persistent blurry vision.  He describes it as "a clear film over both my eyes."  He states that he has not had any vision loss but he feels like everything is slightly blurry.  Today while at work, he started seeing lights and spots which is what prompted him to come to the emergency department.  He does wear glasses and states he had an eye exam about a month ago.  He has history of migraines but states he does not currently have a headache. He states he has had the vision issues without his headache. He denies any  trauma, injury to the eye.  He does feel like both of his eyes are burning.  He has not had any redness.  Patient denies any chest pain, difficulty breathing, numbness/weakness of his arms or legs, diarrhea, constipation, perineal pain.    The history is provided by the patient.       Past Medical History:  Diagnosis Date  . ADHD   . High cholesterol   . Hypertension   . Migraines     Patient Active Problem List   Diagnosis Date Noted  . Chronic migraine without aura without status migrainosus, not intractable 05/24/2019  . Morning headache 05/24/2019  . Elevated LFTs 08/18/2018  . LLQ pain 08/18/2018    Past Surgical History:  Procedure Laterality Date  . CYSTOSCOPY N/A 04/27/2013   Procedure: CYSTOSCOPY;  Surgeon: Marissa Nestle, MD;  Location: [facility];  Service: Urology;  Laterality: N/A;  . DENTAL SURGERY  05/19/2019  . EAR CYST EXCISION N/A 04/27/2013   Procedure: EXCISION OF SEBACEOUS CYST ON PENIS AND SCROTUM;  Surgeon: Marissa Nestle, MD;  Location: [facility];  Service: Urology;  Laterality: N/A;  . PILONIDAL CYST EXCISION N/A 04/16/2013   Procedure: CYST EXCISION PILONIDAL SIMPLE;  Surgeon: Jamesetta So, MD;  Location: [facility];  Service: General;  Laterality: N/A;       Family History  Problem  Relation Age of Onset  . Heart disease Mother   . Migraines Mother   . Cancer Father   . Migraines Brother   . Heart disease Maternal Grandmother   . Migraines Maternal Grandmother   . Heart disease Maternal Grandfather   . Migraines Maternal Uncle   . Migraines Maternal Uncle   . Colon cancer Neg Hx        grandmother with colon cancer  . Colon polyps Neg Hx     Social History   Tobacco Use  . Smoking status: Current Every Day Smoker    Packs/day: 1.00    Years: 10.00    Pack years: 10.00    Types: Cigarettes  . Smokeless tobacco: Never Used  Substance Use Topics  . Alcohol use: Never  . Drug use: Never    Home Medications Prior to Admission  medications   Medication Sig Start Date End Date Taking? Authorizing Provider  diltiazem (CARDIZEM LA) 180 MG 24 hr tablet Take 180 mg by mouth daily. 05/05/19  Yes [provider]  ezetimibe (ZETIA) 10 MG tablet Take 10 mg by mouth daily. 03/19/18  Yes [provider]  rizatriptan (MAXALT-MLT) 10 MG disintegrating tablet Take 1 tablet (10 mg total) by mouth as needed for migraine. May repeat in 2 hours if needed 05/24/19  Yes Melvenia Beam, MD  tamsulosin (FLOMAX) 0.4 MG CAPS capsule Take 0.4 mg by mouth daily. 08/19/19  Yes [provider]  ciprofloxacin (CIPRO) 500 MG tablet Take 500 mg by mouth 2 (two) times daily. for 10 days 08/19/19   [provider]  Fremanezumab-vfrm (AJOVY) 225 MG/1.5ML SOAJ Inject 225 mg into the skin every 30 (thirty) days. Patient not taking: Reported on 08/30/2019 05/24/19   Melvenia Beam, MD    Allergies    Patient has no known allergies.  Review of Systems   Review of Systems  Constitutional: Negative for fever.  Eyes: Positive for visual disturbance.  Respiratory: Negative for cough and shortness of breath.   Cardiovascular: Negative for chest pain.  Gastrointestinal: Positive for abdominal pain and nausea. Negative for vomiting.  Genitourinary: Positive for dysuria. Negative for discharge, flank pain, hematuria, penile pain, penile swelling, scrotal swelling and testicular pain.  Neurological: Negative for weakness, numbness and headaches.  All other systems reviewed and are negative.   Physical Exam Updated Vital Signs BP (!) 147/93 (BP Location: Left Arm)   Pulse 78   Temp 97.8 F (36.6 C) (Oral)   Resp 15   Ht 6' (1.829 m)   Wt 97.5 kg   SpO2 97%   BMI 29.16 kg/m   Physical Exam Vitals and nursing note reviewed.  Constitutional:      Appearance: Normal appearance. He is well-developed.  HENT:     Head: Normocephalic and atraumatic.  Eyes:     General: Lids are normal.     Conjunctiva/sclera:  Conjunctivae normal.     Pupils: Pupils are equal, round, and reactive to light.     Comments: PERRL. EOMs intact. No nystagmus. No neglect. Visual fields intact.   Cardiovascular:     Rate and Rhythm: Normal rate and regular rhythm.     Pulses: Normal pulses.     Heart sounds: Normal heart sounds. No murmur. No friction rub. No gallop.   Pulmonary:     Effort: Pulmonary effort is normal.     Breath sounds: Normal breath sounds.     Comments: Lungs clear to auscultation bilaterally.  Symmetric chest rise.  No wheezing, rales, rhonchi. Abdominal:     Palpations: Abdomen is soft. Abdomen is not rigid.     Tenderness: There is abdominal tenderness in the right lower quadrant. There is no guarding. Positive signs include McBurney's sign.     Hernia: There is no hernia in the left inguinal area or right inguinal area.     Comments: Abdomen is soft, non-distended, non-tender. No rigidity, No guarding. No peritoneal signs.  Tenderness palpation in McBurney's point.  No rigidity, guarding.  Negative Rovsing, rebounding.  No CVA tenderness noted bilaterally.  Genitourinary:    Testes:        Right: Mass, tenderness or swelling not present.        Left: Mass, tenderness or swelling not present.     Comments: The exam was performed with a chaperone present. Normal male genitalia. No evidence of rash, ulcers or lesions. No testicular tenderness, mass, warmth, erythema, edema noted bilaterally. No hernia noted bilaterally.  Musculoskeletal:        General: Normal range of motion.     Cervical back: Full passive range of motion without pain.  Skin:    General: Skin is warm and dry.     Capillary Refill: Capillary refill takes less than 2 seconds.  Neurological:     Mental Status: He is alert and oriented to person, place, and time.  Psychiatric:        Speech: Speech normal.     ED Results / Procedures / Treatments   Labs (all labs ordered are listed, but only abnormal results are  displayed) Labs Reviewed  COMPREHENSIVE METABOLIC PANEL - Abnormal; Notable for the following components:      Result Value   ALT 45 (*)    All other components within normal limits  URINALYSIS, ROUTINE W REFLEX MICROSCOPIC  CBC WITH DIFFERENTIAL/PLATELET  LIPASE, BLOOD  GC/CHLAMYDIA PROBE AMP ([place]) NOT AT [facility]    EKG None  Radiology CT Head Wo Contrast  Result Date: 08/30/2019 CLINICAL DATA:  Headaches EXAM: CT HEAD WITHOUT CONTRAST TECHNIQUE: Contiguous axial images were obtained from the base of the skull through the vertex without intravenous contrast. COMPARISON:  None. FINDINGS: Brain: No evidence of acute infarction, hemorrhage, hydrocephalus, extra-axial collection or mass lesion/mass effect. Vascular: No hyperdense vessel or unexpected calcification. Skull: No osseous abnormality. Sinuses/Orbits: Visualized paranasal sinuses are clear. Visualized mastoid sinuses are clear. Visualized orbits demonstrate no focal abnormality. Other: None IMPRESSION: No acute intracranial pathology. Electronically Signed   By: Kathreen Devoid   On: 08/30/2019 19:40   CT ABDOMEN PELVIS W CONTRAST  Result Date: 08/30/2019 CLINICAL DATA:  Diagnosed with the UTI a week ago. Blurred vision. EXAM: CT ABDOMEN AND PELVIS WITH CONTRAST TECHNIQUE: Multidetector CT imaging of the abdomen and pelvis was performed using the standard protocol following bolus administration of intravenous contrast. CONTRAST:  19mL OMNIPAQUE IOHEXOL 300 MG/ML  SOLN COMPARISON:  CT chest 02/28/2016 FINDINGS: Lower chest: No acute abnormality. Stable 7 mm right lower lobe pulmonary nodule unchanged compared with 02/28/2016. Hepatobiliary: No focal liver abnormality is seen. No gallstones, gallbladder wall thickening, or biliary dilatation. Pancreas: Unremarkable. No pancreatic ductal dilatation or surrounding inflammatory changes. Spleen: Normal in size without focal abnormality. Adrenals/Urinary Tract: Normal adrenal glands. 14 mm  hypodense, fluid attenuating left lower pole renal mass consistent with a cyst. Similar adjacent 10 mm hypodense, fluid attenuating left lower pole renal mass consistent with a cyst. No urolithiasis or obstructive uropathy. Bladder is normal. Stomach/Bowel: Stomach is within normal limits.  Appendix appears normal. No evidence of bowel wall thickening, distention, or inflammatory changes. Vascular/Lymphatic: No significant vascular findings are present. No enlarged abdominal or pelvic lymph nodes. Reproductive: Prostate is unremarkable. Other: No abdominal wall hernia or abnormality. No abdominopelvic ascites. Musculoskeletal: No acute osseous abnormality. No aggressive osseous lesion. IMPRESSION: No acute abdominal or pelvic pathology. Electronically Signed   By: Kathreen Devoid   On: 08/30/2019 19:40    Procedures Procedures (including critical care time)  Medications Ordered in ED Medications  tetracaine (PONTOCAINE) 0.5 % ophthalmic solution 1 drop (1 drop Both Eyes Given 08/30/19 2040)  fluorescein ophthalmic strip 1 strip (1 strip Both Eyes Given 08/30/19 2040)  sodium chloride 0.9 % bolus 1,000 mL (0 mLs Intravenous Stopped 08/30/19 2038)  iohexol (OMNIPAQUE) 300 MG/ML solution 100 mL (100 mLs Intravenous Contrast Given 08/30/19 1913)    ED Course  I have reviewed the triage vital signs and the nursing notes.  Pertinent labs & imaging results that were available during my care of the patient were reviewed by me and considered in my medical decision making (see chart for details).    MDM Rules/Calculators/A&P                      34 year old male who presents for evaluation of multiple complaints.  Reports persistent dysuria that has been ongoing for last several weeks.  Has been on Cipro for treatment of UTI.  Also endorses right lower quadrant abdominal pain.  He also reports that over the last 2 weeks, he has had blurry vision.  On initially arrival, he is afebrile, nontoxic-appearing.  Vital  signs are stable.  On exam, he has point tenderness into the right lower quadrant at McBurney's point.  No CVA tenderness.  No visual field deficits noted and he is able to tell me how any fingers I am holding up.  He describes this is a clear film that is causing everything to be blurry. No amaurosis fugax. Consider persistent UTI versus kidney stone versus STD.  Also consider appendicitis.  History/physical exam not concerning for diverticulitis.  He has no neuro deficits noted on exam.  Plan for imaging, lab work.  We will plan for CT head given his persistent blurry vision.  Will evaluate for any intracranial mass.  At this time, history/physical exam not concerning for CVA, CRVO, CRAO, glaucoma.  UA shows no abnormalities. Lipase is normal. CMP shows ALT of 45, otherwise unremarkable. CBC shows leukocytosis or anemia.   Intraocular pressure as documented below:  Left IOP: 16, 22 Right IOP: 18, 16  [place] Evaluation: no evidence of fluorescein uptake, corneal abrasion, dendritic lesion.   CT head negative for any acute intracranial abnormality.  CT abdomen pelvis shows no acute infectious or acute abnormality.  On his CT abdomen, there is mention of renal cyst noted on the left lower pole.  This had been seen on his CT scan in March 2020.  At this time, patient's work-up is reassuring.  I do have gonorrhea/chlamydia pending on him given persistent dysuria but at this time, his exam is not concerning for testicular torsion, epididymitis.  Work-up is not consistent with UTI, pyelonephritis, kidney stone.  Additionally, unclear etiology of what his blurry vision is.  At this time, no indication that he needs emergent MRI as do not suspect acute CVA/TIA. Overall unclear etiology, patient appears appropriate for outpatient work-up with primary care, neurology and urology. Discussed patient with Dr. Karle Starch who is agreeable to plan. He  discussed with patient. At this time, patient exhibits no emergent  life-threatening condition that require further evaluation in ED.  Portions of this note were generated with [employer]. Dictation errors may occur despite best attempts at proofreading.   Final Clinical Impression(s) / ED Diagnoses Final diagnoses:  Dysuria  Blurry vision    Rx / DC Orders ED Discharge Orders         Ordered    Ambulatory referral to Neurology    Comments: An appointment is requested in approximately: 1 week   08/30/19 2015           Volanda Napoleon, PA-C 08/30/19 2310    Truddie Hidden, MD 08/30/19 [phone]

## 2019-08-30 NOTE — ED Triage Notes (Signed)
Pt states he was diagnosed with a UTI a week ago and states he has been taking an antibiotic but he continues to have burning with urination; pt also states he has been having blurred vision and is unsure if it's related to his Cipro; pt also c/o migraines on a daily basis

## 2019-09-01 LAB — GC/CHLAMYDIA PROBE AMP (~~LOC~~) NOT AT ARMC
Chlamydia: NEGATIVE
Comment: NEGATIVE
Comment: NORMAL
Neisseria Gonorrhea: NEGATIVE

## 2019-09-13 DIAGNOSIS — R300 Dysuria: Secondary | ICD-10-CM | POA: Diagnosis not present

## 2019-09-13 DIAGNOSIS — N410 Acute prostatitis: Secondary | ICD-10-CM | POA: Diagnosis not present

## 2019-09-14 DIAGNOSIS — H11442 Conjunctival cysts, left eye: Secondary | ICD-10-CM | POA: Diagnosis not present

## 2019-09-15 DIAGNOSIS — B078 Other viral warts: Secondary | ICD-10-CM | POA: Diagnosis not present

## 2019-09-22 ENCOUNTER — Ambulatory Visit: Payer: BC Managed Care – PPO | Admitting: Family Medicine

## 2019-10-27 DIAGNOSIS — N410 Acute prostatitis: Secondary | ICD-10-CM | POA: Diagnosis not present

## 2019-10-28 ENCOUNTER — Institutional Professional Consult (permissible substitution): Payer: BC Managed Care – PPO | Admitting: Neurology

## 2019-12-16 DIAGNOSIS — R300 Dysuria: Secondary | ICD-10-CM | POA: Diagnosis not present

## 2019-12-16 DIAGNOSIS — N411 Chronic prostatitis: Secondary | ICD-10-CM | POA: Diagnosis not present

## 2020-01-19 DIAGNOSIS — R21 Rash and other nonspecific skin eruption: Secondary | ICD-10-CM | POA: Diagnosis not present

## 2020-02-22 ENCOUNTER — Emergency Department (HOSPITAL_COMMUNITY)
Admission: EM | Admit: 2020-02-22 | Discharge: 2020-02-22 | Disposition: A | Discharge status: Home / Self Care | Payer: BC Managed Care – PPO | Attending: Emergency Medicine | Admitting: Emergency Medicine

## 2020-02-22 ENCOUNTER — Other Ambulatory Visit: Payer: Self-pay

## 2020-02-22 ENCOUNTER — Emergency Department (HOSPITAL_COMMUNITY): Payer: BC Managed Care – PPO

## 2020-02-22 ENCOUNTER — Encounter (HOSPITAL_COMMUNITY): Payer: Self-pay | Admitting: Emergency Medicine

## 2020-02-22 DIAGNOSIS — R42 Dizziness and giddiness: Secondary | ICD-10-CM | POA: Diagnosis not present

## 2020-02-22 DIAGNOSIS — Z79899 Other long term (current) drug therapy: Secondary | ICD-10-CM | POA: Diagnosis not present

## 2020-02-22 DIAGNOSIS — R519 Headache, unspecified: Secondary | ICD-10-CM | POA: Insufficient documentation

## 2020-02-22 DIAGNOSIS — R079 Chest pain, unspecified: Secondary | ICD-10-CM | POA: Diagnosis not present

## 2020-02-22 DIAGNOSIS — F17210 Nicotine dependence, cigarettes, uncomplicated: Secondary | ICD-10-CM | POA: Diagnosis not present

## 2020-02-22 DIAGNOSIS — I10 Essential (primary) hypertension: Secondary | ICD-10-CM | POA: Insufficient documentation

## 2020-02-22 LAB — CBC
HCT: 49.4 % (ref 39.0–52.0)
Hemoglobin: 16.5 g/dL (ref 13.0–17.0)
MCH: 30.9 pg (ref 26.0–34.0)
MCHC: 33.4 g/dL (ref 30.0–36.0)
MCV: 92.5 fL (ref 80.0–100.0)
Platelets: 348 10*3/uL (ref 150–400)
RBC: 5.34 MIL/uL (ref 4.22–5.81)
RDW: 12 % (ref 11.5–15.5)
WBC: 7.2 10*3/uL (ref 4.0–10.5)
nRBC: 0 % (ref 0.0–0.2)

## 2020-02-22 LAB — BASIC METABOLIC PANEL
Anion gap: 9 (ref 5–15)
BUN: 25 mg/dL — ABNORMAL HIGH (ref 6–20)
CO2: 27 mmol/L (ref 22–32)
Calcium: 9.6 mg/dL (ref 8.9–10.3)
Chloride: 99 mmol/L (ref 98–111)
Creatinine, Ser: 0.93 mg/dL (ref 0.61–1.24)
GFR, Estimated: >60 mL/min (ref >60)
Glucose, Bld: 95 mg/dL (ref 70–99)
Potassium: 3.9 mmol/L (ref 3.5–5.1)
Sodium: 135 mmol/L (ref 135–145)

## 2020-02-22 LAB — TROPONIN I (HIGH SENSITIVITY)
Troponin I (High Sensitivity): <2 ng/L (ref <18)
Troponin I (High Sensitivity): <2 ng/L (ref <18)

## 2020-02-22 MED ORDER — DIPHENHYDRAMINE HCL 25 MG PO CAPS
25.0000 mg | ORAL_CAPSULE | Freq: Once | ORAL | Status: AC
  Administered 2020-02-22: 25 mg via ORAL
  Filled 2020-02-22: qty 1

## 2020-02-22 MED ORDER — HYDROXYZINE HCL 25 MG PO TABS
25.0000 mg | ORAL_TABLET | Freq: Four times a day (QID) | ORAL | 0 refills | Status: DC
Start: 2020-02-22 — End: 2020-02-29

## 2020-02-22 MED ORDER — METOCLOPRAMIDE HCL 10 MG PO TABS
10.0000 mg | ORAL_TABLET | Freq: Four times a day (QID) | ORAL | 0 refills | Status: DC
Start: 2020-02-22 — End: 2020-02-29

## 2020-02-22 MED ORDER — METOCLOPRAMIDE HCL 10 MG PO TABS
10.0000 mg | ORAL_TABLET | Freq: Once | ORAL | Status: AC
  Administered 2020-02-22: 10 mg via ORAL
  Filled 2020-02-22: qty 1

## 2020-02-22 NOTE — ED Provider Notes (Signed)
[facility] EMERGENCY DEPARTMENT Provider Note   CSN: [id number] Arrival date & time: 02/22/20  1008     History Chief Complaint  Patient presents with  . Chest Pain    Tyler Dean is a 34 y.o. male.  HPI Patient is a 34 year old male with past medical history of ADHD and hypercholesterol and hypertension for which he takes Cardizem and ezetimibe.  Patient is clean to the emergency department today for chest pain that he states started 2 hours prior to arrival in the emergency department. He states that it has been constant somewhat achy he states it is not sharp or stabbing not exertional or pleuritic. Seems to be somewhat worse when he is laying down but no other aggravating or mitigating factors. He denies any associated symptoms besides some intermittent lightheadedness which he states started yesterday. He states that he has been seen for this before but he is concerned because he has had some blurred vision and lightheadedness.  He is having difficulty describing the blurred vision as he has none currently.  He is uncertain whether is 1 eye or both eyes.  And uncertain whether it is the full visual field or not.  He states he is able to blink and goes away.  Also seems to help to sit down.  He states that these episodes seem to happen when he stands up. He denies any ringing in his ears and denies any vertiginous symptoms. He is not use any drugs or alcohol.  He denies any vomiting but does endorse some mild nausea.    Past Medical History:  Diagnosis Date  . ADHD   . High cholesterol   . Hypertension   . Migraines     Patient Active Problem List   Diagnosis Date Noted  . Chronic migraine without aura without status migrainosus, not intractable 05/24/2019  . Morning headache 05/24/2019  . Elevated LFTs 08/18/2018  . LLQ pain 08/18/2018    Past Surgical History:  Procedure Laterality Date  . CYSTOSCOPY N/A 04/27/2013   Procedure: CYSTOSCOPY;  Surgeon: Marissa Nestle, MD;  Location: [facility];  Service: Urology;  Laterality: N/A;  . DENTAL SURGERY  05/19/2019  . EAR CYST EXCISION N/A 04/27/2013   Procedure: EXCISION OF SEBACEOUS CYST ON PENIS AND SCROTUM;  Surgeon: Marissa Nestle, MD;  Location: [facility];  Service: Urology;  Laterality: N/A;  . PILONIDAL CYST EXCISION N/A 04/16/2013   Procedure: CYST EXCISION PILONIDAL SIMPLE;  Surgeon: Jamesetta So, MD;  Location: [facility];  Service: General;  Laterality: N/A;       Family History  Problem Relation Age of Onset  . Heart disease Mother   . Migraines Mother   . Cancer Father   . Migraines Brother   . Heart disease Maternal Grandmother   . Migraines Maternal Grandmother   . Heart disease Maternal Grandfather   . Migraines Maternal Uncle   . Migraines Maternal Uncle   . Colon cancer Neg Hx        grandmother with colon cancer  . Colon polyps Neg Hx     Social History   Tobacco Use  . Smoking status: Current Every Day Smoker    Packs/day: 1.00    Years: 10.00    Pack years: 10.00    Types: Cigarettes  . Smokeless tobacco: Never Used  Vaping Use  . Vaping Use: Former  Substance Use Topics  . Alcohol use: Never  . Drug use: Never    Home Medications  Prior to Admission medications   Medication Sig Start Date End Date Taking? Authorizing Provider  diazepam (VALIUM) 5 MG tablet Take 5 mg by mouth at bedtime. 01/18/20  Yes [provider]  diltiazem (CARDIZEM LA) 180 MG 24 hr tablet Take 180 mg by mouth daily. 05/05/19  Yes [provider]  ezetimibe (ZETIA) 10 MG tablet Take 10 mg by mouth daily. 03/19/18  Yes [provider]  meloxicam (MOBIC) 15 MG tablet Take 15 mg by mouth daily. 01/18/20  Yes [provider]  nicotine polacrilex (COMMIT) 2 MG lozenge Take by mouth. 11/18/19  Yes [provider]  rizatriptan (MAXALT-MLT) 10 MG disintegrating tablet Take 1 tablet (10 mg total) by mouth as needed for migraine. May repeat in 2 hours if needed  05/24/19  Yes Melvenia Beam, MD  tamsulosin (FLOMAX) 0.4 MG CAPS capsule Take 0.4 mg by mouth daily. 08/19/19  Yes [provider]  Fremanezumab-vfrm (AJOVY) 225 MG/1.5ML SOAJ Inject 225 mg into the skin every 30 (thirty) days. Patient not taking: Reported on 02/22/2020 05/24/19   Melvenia Beam, MD  hydrOXYzine (ATARAX/VISTARIL) 25 MG tablet Take 1 tablet (25 mg total) by mouth every 6 (six) hours. 02/22/20   Tedd Sias, PA  metoCLOPramide (REGLAN) 10 MG tablet Take 1 tablet (10 mg total) by mouth every 6 (six) hours. 02/22/20   Tedd Sias, PA    Allergies    Patient has no known allergies.  Review of Systems   Review of Systems  Constitutional: Positive for fatigue. Negative for chills and fever.  HENT: Negative for congestion.   Eyes: Positive for visual disturbance.  Respiratory: Negative for cough and shortness of breath.   Cardiovascular: Positive for chest pain.  Gastrointestinal: Negative for abdominal pain.  Musculoskeletal: Negative for neck pain.  Skin: Negative for rash and wound.  Neurological: Positive for light-headedness.    Physical Exam Updated Vital Signs BP 122/89   Pulse 80   Temp 98 F (36.7 C) (Oral)   Resp 18   Ht 6' (1.829 m)   Wt 99 kg   SpO2 98%   BMI 29.59 kg/m   Physical Exam Vitals and nursing note reviewed.  Constitutional:      General: He is not in acute distress. HENT:     Head: Normocephalic and atraumatic.     Nose: Nose normal.  Eyes:     General: No scleral icterus. Cardiovascular:     Rate and Rhythm: Normal rate and regular rhythm.     Pulses: Normal pulses.     Heart sounds: Normal heart sounds.  Pulmonary:     Effort: Pulmonary effort is normal. No respiratory distress.     Breath sounds: No wheezing.  Abdominal:     Palpations: Abdomen is soft.     Tenderness: There is no abdominal tenderness.  Musculoskeletal:     Cervical back: Normal range of motion.     Right lower leg: No edema.     Left  lower leg: No edema.  Skin:    General: Skin is warm and dry.     Capillary Refill: Capillary refill takes less than 2 seconds.  Neurological:     Mental Status: He is alert. Mental status is at baseline.     Comments: Alert and oriented to self, place, time and event.   Speech is fluent, clear without dysarthria or dysphasia.   Strength 5/5 in upper/lower extremities  Sensation intact in upper/lower extremities   Normal gait.  Negative Romberg. No pronator drift.  Normal finger-to-nose and feet tapping.  CN I not tested  CN II grossly intact visual fields bilaterally. Did not visualize posterior eye.   CN III, IV, VI PERRLA and EOMs intact bilaterally  CN V Intact sensation to sharp and light touch to the face  CN VII facial movements symmetric  CN VIII not tested  CN IX, X no uvula deviation, symmetric rise of soft palate  CN XI 5/5 SCM and trapezius strength bilaterally  CN XII Midline tongue protrusion, symmetric L/R movements   Psychiatric:        Mood and Affect: Mood normal.        Behavior: Behavior normal.     ED Results / Procedures / Treatments   Labs (all labs ordered are listed, but only abnormal results are displayed) Labs Reviewed  BASIC METABOLIC PANEL - Abnormal; Notable for the following components:      Result Value   BUN 25 (*)    All other components within normal limits  CBC  TROPONIN I (HIGH SENSITIVITY)  TROPONIN I (HIGH SENSITIVITY)    EKG EKG Interpretation  Date/Time:  Tuesday February 22 2020 10:21:56 EDT Ventricular Rate:  98 PR Interval:  128 QRS Duration: 74 QT Interval:  346 QTC Calculation: 441 R Axis:   43 Text Interpretation: Normal sinus rhythm Normal ECG NO STEMI Confirmed by Octaviano Glow [phone]) on 02/22/2020 2:02:34 PM   Radiology DG Chest 2 View  Result Date: 02/22/2020 CLINICAL DATA:  Chest pain. EXAM: CHEST - 2 VIEW COMPARISON:  February 28, 2016 FINDINGS: The heart size and mediastinal contours are within normal  limits. No consolidation. No pleural effusions or pneumothorax. No acute osseous abnormality. IMPRESSION: No acute cardiopulmonary disease. Electronically Signed   By: Margaretha Sheffield MD   On: 02/22/2020 11:13   CT Head Wo Contrast  Result Date: 02/22/2020 CLINICAL DATA:  Headache, blurred vision, dizziness EXAM: CT HEAD WITHOUT CONTRAST TECHNIQUE: Contiguous axial images were obtained from the base of the skull through the vertex without intravenous contrast. COMPARISON:  08/30/2019 FINDINGS: Brain: No evidence of acute infarction, hemorrhage, hydrocephalus, extra-axial collection or mass lesion/mass effect. Vascular: No hyperdense vessel or unexpected calcification. Skull: Normal. Negative for fracture or focal lesion. Sinuses/Orbits: No acute finding. Other: None. IMPRESSION: No acute intracranial findings. Electronically Signed   By: Davina Poke D.O.   On: 02/22/2020 16:05    Procedures Procedures (including critical care time)  Medications Ordered in ED Medications  metoCLOPramide (REGLAN) tablet 10 mg (10 mg Oral Given 02/22/20 1457)  diphenhydrAMINE (BENADRYL) capsule 25 mg (25 mg Oral Given 02/22/20 1457)    ED Course  I have reviewed the triage vital signs and the nursing notes.  Pertinent labs & imaging results that were available during my care of the patient were reviewed by me and considered in my medical decision making (see chart for details).  Patient is a 34 year old male with past medical history detailed below presented today for concerns over chest pain, dizziness, feelings of anxiety.  Physical exam patient is neurologically intact, has vital signs within normal limits is overall well-appearing.  No abnormal lung sounds.  Cardiac exam is within normal notes as well.  Patient seems quite anxious.  Will obtain CT imaging of the head since he has had some dizziness in the past and felt that he had not been appropriately worked up.  I suspect that this will provide  him some reassurance which will likely improve some of his  other symptoms which may be related to anxiety.  His troponin x2 were within normal limits and undetectable.  CBC without leukocytosis or anemia.  BMP without any abnormalities apart very mild elevation in BUN.  He does not appear clinically dehydrated however encourage p.o. fluids and gave patient dose of Reglan and Benadryl and his headache and dizziness improved.  On reassessment he states he has no further issues at this time.  He is following up with cardiology and neurology for the similar symptoms.  He was referred by his PCP.  Clinical Course as of Feb 22 2036  Tue Feb 22, 2020  1422 EKG is normal sinus rhythm with no significant axis deviation.  No ST-T wave abnormalities.  No premature or late beats.  No evidence of hypertrophy.   [WF]  1423 Chest x-ray without any acute abnormality.  Agree of radiologist no acute disease.   [WF]  1423 Troponin x1 within normal limits seconds pending at this time.   [WF]  1423 CBC without leukocytosis or anemia   [WF]  1423 BMP without any significant electrolyte derangements.  BUN very mildly elevated perhaps somewhat dehydrated however no elevated creatinine.  Low suspicion for GI bleed patient denies any black or tarry stools.   [WF]  1529 Troponin x2 within normal limits no change.   [WF]  1612 CT head without acute abnormality   [WF]    Clinical Course User Index [WF] Tedd Sias, PA   MDM Rules/Calculators/A&P                          The medical records were personally reviewed by myself. I personally reviewed all lab results and interpreted all imaging studies and either concurred with their official read or contacted radiology for clarification. Additional history obtained from old records.  This patient appears reasonably screened and I doubt any other medical condition requiring further workup, evaluation, or treatment in the ED at this time prior to discharge.    Patient's vitals are WNL apart from vital sign abnormalities discussed above, patient is in NAD, and able to ambulate in the ED at their baseline and able to tolerate PO.  Pain has been managed or a plan has been made for home management and has no complaints prior to discharge. Patient is comfortable with above plan and for discharge at this time. All questions were answered prior to disposition. Results from the ER workup discussed with the patient face to face and all questions answered to the best of my ability. The patient is safe for discharge with strict return precautions. Patient appears safe for discharge with appropriate follow-up. Conveyed my impression with the patient and they voiced understanding and are agreeable to plan.   An After Visit Summary was printed and given to the patient.  Portions of this note were generated with [employer]. Dictation errors may occur despite best attempts at proofreading.   Final Clinical Impression(s) / ED Diagnoses Final diagnoses:  Chest pain, unspecified type  Dizziness  Nonintractable headache, unspecified chronicity pattern, unspecified headache type    Rx / DC Orders ED Discharge Orders         Ordered    hydrOXYzine (ATARAX/VISTARIL) 25 MG tablet  Every 6 hours        02/22/20 1625    metoCLOPramide (REGLAN) 10 MG tablet  Every 6 hours        02/22/20 1637  Pati Gallo [place], [place] 02/22/20 2038    Wyvonnia Dusky, MD 02/24/20 1054

## 2020-02-22 NOTE — Discharge Instructions (Addendum)
Your CT of your head, your cardiac enzymes, EKG and chest x-ray and other laboratory evaluations today are reassuring.  Please keep your cardiology and neurology follow-up appointments and drink plenty of water.  Minimize return to the emergency department for any new or concerning symptoms.  Please use Tylenol or ibuprofen for pain.  You may use 600 mg ibuprofen every 6 hours or 1000 mg of Tylenol every 6 hours.  You may choose to alternate between the 2.  This would be most effective.  Not to exceed 4 g of Tylenol within 24 hours.  Not to exceed 3200 mg ibuprofen 24 hours.  I have prescribed you hydroxyzine which may help with anxiety. Please take as needed as prescribed.

## 2020-02-22 NOTE — ED Triage Notes (Signed)
Patient is having blurred vision and dizziness, since last night and chest pain that started approx 2 hours ago.

## 2020-02-28 ENCOUNTER — Encounter: Payer: Self-pay | Admitting: Cardiology

## 2020-02-28 NOTE — Progress Notes (Signed)
Cardiology Office Note  Date: 02/29/2020   ID: Tyler Dean 16-Nov-1985, MRN [id number]  PCP:  Tyler Lemons, PA  Cardiologist:  Rozann Lesches, MD Electrophysiologist:  None   Chief Complaint  Patient presents with   Hyperlipidemia   Chest Pain    History of Present Illness: Tyler Dean is a 34 y.o. male referred for cardiology consultation by [name] PA-C with [facility] with history of hyperlipidemia and chest discomfort.  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  His most recent LDL was 209.  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  He has been on Zetia most recently.  In terms of symptoms, he does report intermittent chest pressure, although nonexertional.  He notices this more when he is sitting still, seems to be better when he is busy.  He was seen in the ER recently on October 26 with similar symptoms.  High-sensitivity troponin I levels were normal and his ECG showed no acute ST segment changes.  Chest x-ray also without acute findings.  He was also reporting intermittent lightheadedness and intermittent visual changes.  Head CT did not show any acute findings.  He has had no documented arrhythmias.  He does have a family history of premature CAD, his mother is a patient of mine.   Past Medical History:  Diagnosis Date   ADHD    Essential hypertension    Hyperlipidemia    Migraines     Past Surgical History:  Procedure Laterality Date   CYSTOSCOPY N/A 04/27/2013   Procedure: CYSTOSCOPY;  Surgeon: Marissa Nestle, MD;  Location: [facility];  Service: Urology;  Laterality: N/A;   DENTAL SURGERY  05/19/2019   EAR CYST EXCISION N/A 04/27/2013   Procedure: EXCISION OF SEBACEOUS CYST ON PENIS AND SCROTUM;  Surgeon: Marissa Nestle, MD;  Location: [facility];  Service: Urology;  Laterality: N/A;   PILONIDAL CYST EXCISION N/A  04/16/2013   Procedure: CYST EXCISION PILONIDAL SIMPLE;  Surgeon: Jamesetta So, MD;  Location: [facility];  Service: General;  Laterality: N/A;    Current Outpatient Medications  Medication Sig Dispense Refill   diazepam (VALIUM) 5 MG tablet Take 5 mg by mouth at bedtime.     diltiazem (CARDIZEM LA) 180 MG 24 hr tablet Take 180 mg by mouth daily.     ezetimibe (ZETIA) 10 MG tablet Take 10 mg by mouth daily.     No current facility-administered medications for this visit.   Allergies:  Patient has no known allergies.   Social History: The patient  reports that he has been smoking cigarettes. He has a 10.00 pack-year smoking history. He has never used smokeless tobacco. He reports that he does not drink alcohol and does not use drugs.   Family History: The patient's family history includes Cancer in his father; Heart disease in his maternal grandfather, maternal grandmother, and mother; Migraines in his brother, maternal grandmother, maternal uncle, maternal uncle, and mother.   ROS: No palpitations or syncope.  Physical Exam: VS:  BP 124/80    Pulse 98    Ht 6' (1.829 m)    Wt 222 lb (100.7 kg)    SpO2 97%    BMI 30.11 kg/m , BMI Body mass index is 30.11 kg/m.  Wt Readings from Last 3 Encounters:  02/29/20 222 lb (100.7 kg)  02/22/20 218 lb 3.2 oz (99  kg)  08/30/19 215 lb (97.5 kg)    General: Patient appears comfortable at rest. HEENT: Conjunctiva and lids normal, wearing a mask. Neck: Supple, no elevated JVP or carotid bruits, no thyromegaly. Lungs: Clear to auscultation, nonlabored breathing at rest. Cardiac: Regular rate and rhythm, no S3 or significant systolic murmur, no pericardial rub. Abdomen: Soft, nontender, bowel sounds present. Extremities: No pitting edema, distal pulses 2+. Skin: Warm and dry. Musculoskeletal: No kyphosis. Neuropsychiatric: Alert and oriented x3, affect grossly appropriate.  ECG:  An ECG dated 02/28/2016 was personally reviewed today and  demonstrated:  Normal sinus rhythm.  Recent Labwork: 08/30/2019: ALT 45; AST 27 02/22/2020: BUN 25; Creatinine, Ser 0.93; Hemoglobin 16.5; Platelets 348; Potassium 3.9; Sodium 135   Other Studies Reviewed Today:  Chest x-ray 02/22/2020: FINDINGS: The heart size and mediastinal contours are within normal limits. No consolidation. No pleural effusions or pneumothorax. No acute osseous abnormality.  IMPRESSION: No acute cardiopulmonary disease.  Assessment and Plan:  1.  Intermittent chest pain, typical and atypical features, in a 34 year old male with personal history of hypertension and severe hyperlipidemia, tobacco use, also family history of premature CAD.  Recent high-sensitivity troponin I levels at ER visit were normal and ECG showed no acute ST segment changes.  He has not undergone any prior ischemic testing and we will proceed with a Lexiscan Myoview.  2.  Severe hyperlipidemia with multistatin intolerance.  Recent LDL 209 at employee screening.  He has been on Zetia.  We discussed referral to the lipid clinic to evaluate for possibility of PCSK9 inhibitor.  3.  Essential hypertension, currently on Cardizem LA 180 mg daily.  4.  Tobacco use, smoking cessation will be important going forward.  Medication Adjustments/Labs and Tests Ordered: Current medicines are reviewed at length with the patient today.  Concerns regarding medicines are outlined above.   Tests Ordered: Orders Placed This Encounter  Procedures   NM Myocar Multi W/Spect W/Wall Motion / EF   AMB Referral to [facility]    Medication Changes: No orders of the defined types were placed in this encounter.   Disposition:  Follow up test results.  Signed, Satira Sark, MD, [facility] 02/29/2020 3:21 PM    [facility] [place], [place], [place] [address] Phone: [phone]; Fax: [phone]

## 2020-02-29 ENCOUNTER — Encounter: Payer: Self-pay | Admitting: Cardiology

## 2020-02-29 ENCOUNTER — Telehealth: Payer: Self-pay | Admitting: Cardiology

## 2020-02-29 ENCOUNTER — Encounter: Payer: Self-pay | Admitting: *Deleted

## 2020-02-29 ENCOUNTER — Ambulatory Visit: Payer: BC Managed Care – PPO | Admitting: Cardiology

## 2020-02-29 VITALS — BP 124/80 | HR 98 | Ht 72.0 in | Wt 222.0 lb

## 2020-02-29 DIAGNOSIS — E782 Mixed hyperlipidemia: Secondary | ICD-10-CM

## 2020-02-29 DIAGNOSIS — I10 Essential (primary) hypertension: Secondary | ICD-10-CM

## 2020-02-29 DIAGNOSIS — Z789 Other specified health status: Secondary | ICD-10-CM

## 2020-02-29 DIAGNOSIS — E7849 Other hyperlipidemia: Secondary | ICD-10-CM

## 2020-02-29 DIAGNOSIS — R079 Chest pain, unspecified: Secondary | ICD-10-CM

## 2020-02-29 DIAGNOSIS — Z8249 Family history of ischemic heart disease and other diseases of the circulatory system: Secondary | ICD-10-CM

## 2020-02-29 NOTE — Patient Instructions (Addendum)
Medication Instructions:    Your physician recommends that you continue on your current medications as directed. Please refer to the Current Medication list given to you today.  Labwork:  None  Testing/Procedures: Your physician has requested that you have a lexiscan myoview. For further information please visit [email or web address]. Please follow instruction sheet, as given.  Follow-Up:  Your physician recommends that you schedule a follow-up appointment in: pending  You have been referred to the [facility].  Any Other Special Instructions Will Be Listed Below (If Applicable).  If you need a refill on your cardiac medications before your next appointment, please call your pharmacy.

## 2020-02-29 NOTE — Telephone Encounter (Signed)
Pre-cert Verification for the following procedure    Lexiscan Myoview dx: chest pain DR MCDOWELL   DATE:03/03/2020  LOCATION:[facility]

## 2020-03-02 DIAGNOSIS — Z23 Encounter for immunization: Secondary | ICD-10-CM | POA: Diagnosis not present

## 2020-03-03 ENCOUNTER — Encounter (HOSPITAL_COMMUNITY): Payer: BC Managed Care – PPO

## 2020-03-10 DIAGNOSIS — I10 Essential (primary) hypertension: Secondary | ICD-10-CM | POA: Diagnosis not present

## 2020-03-10 DIAGNOSIS — F17210 Nicotine dependence, cigarettes, uncomplicated: Secondary | ICD-10-CM | POA: Diagnosis not present

## 2020-03-10 DIAGNOSIS — M549 Dorsalgia, unspecified: Secondary | ICD-10-CM | POA: Diagnosis not present

## 2020-03-10 DIAGNOSIS — Z6830 Body mass index (BMI) 30.0-30.9, adult: Secondary | ICD-10-CM | POA: Diagnosis not present

## 2020-03-10 DIAGNOSIS — E7800 Pure hypercholesterolemia, unspecified: Secondary | ICD-10-CM | POA: Diagnosis not present

## 2020-03-10 DIAGNOSIS — Z299 Encounter for prophylactic measures, unspecified: Secondary | ICD-10-CM | POA: Diagnosis not present

## 2020-03-15 DIAGNOSIS — N411 Chronic prostatitis: Secondary | ICD-10-CM | POA: Diagnosis not present

## 2020-03-15 DIAGNOSIS — M62838 Other muscle spasm: Secondary | ICD-10-CM | POA: Diagnosis not present

## 2020-03-15 DIAGNOSIS — M6281 Muscle weakness (generalized): Secondary | ICD-10-CM | POA: Diagnosis not present

## 2020-03-15 DIAGNOSIS — M6289 Other specified disorders of muscle: Secondary | ICD-10-CM | POA: Diagnosis not present

## 2020-03-28 ENCOUNTER — Ambulatory Visit: Payer: BC Managed Care – PPO

## 2020-03-29 ENCOUNTER — Encounter: Payer: Self-pay | Admitting: Internal Medicine

## 2020-04-13 DIAGNOSIS — H6123 Impacted cerumen, bilateral: Secondary | ICD-10-CM | POA: Diagnosis not present

## 2020-04-13 DIAGNOSIS — F17210 Nicotine dependence, cigarettes, uncomplicated: Secondary | ICD-10-CM | POA: Diagnosis not present

## 2020-04-13 DIAGNOSIS — R070 Pain in throat: Secondary | ICD-10-CM | POA: Diagnosis not present

## 2020-07-23 ENCOUNTER — Encounter: Payer: Self-pay | Admitting: Emergency Medicine

## 2020-07-23 ENCOUNTER — Other Ambulatory Visit: Payer: Self-pay

## 2020-07-23 ENCOUNTER — Ambulatory Visit (INDEPENDENT_AMBULATORY_CARE_PROVIDER_SITE_OTHER): Payer: BC Managed Care – PPO

## 2020-07-23 ENCOUNTER — Ambulatory Visit
Admission: EM | Admit: 2020-07-23 | Discharge: 2020-07-23 | Disposition: A | Discharge status: Home / Self Care | Payer: BC Managed Care – PPO | Attending: Physician Assistant | Admitting: Physician Assistant

## 2020-07-23 DIAGNOSIS — S91331A Puncture wound without foreign body, right foot, initial encounter: Secondary | ICD-10-CM | POA: Diagnosis not present

## 2020-07-23 DIAGNOSIS — M545 Low back pain, unspecified: Secondary | ICD-10-CM

## 2020-07-23 DIAGNOSIS — Z23 Encounter for immunization: Secondary | ICD-10-CM

## 2020-07-23 MED ORDER — TETANUS-DIPHTH-ACELL PERTUSSIS 5-2.5-18.5 LF-MCG/0.5 IM SUSY
0.5000 mL | PREFILLED_SYRINGE | Freq: Once | INTRAMUSCULAR | Status: AC
  Administered 2020-07-23: 0.5 mL via INTRAMUSCULAR

## 2020-07-23 NOTE — ED Provider Notes (Signed)
[facility]    CSN: [id number] Arrival date & time: 07/23/20  1339      History   Chief Complaint Chief Complaint  Patient presents with  . Back Pain    HPI Tyler Dean is a 35 y.o. male.   Pt complains of chronic lower back pain with intermittent flare ups. Denies injury or trauma.  He is currently on a steroid dosepak due to pain.  Denies radiation of pain, numbness, tingling, saddle anesthesia.  He is requesting xrays of his lower back today.   Pt also reports he stepped on a small rusty nail last night, last tetanus unknown.  Bleeding controlled. No other injuries.      Past Medical History:  Diagnosis Date  . ADHD   . Essential hypertension   . Hyperlipidemia   . Migraines     Patient Active Problem List   Diagnosis Date Noted  . Chronic migraine without aura without status migrainosus, not intractable 05/24/2019  . Morning headache 05/24/2019  . Elevated LFTs 08/18/2018  . LLQ pain 08/18/2018    Past Surgical History:  Procedure Laterality Date  . CYSTOSCOPY N/A 04/27/2013   Procedure: CYSTOSCOPY;  Surgeon: Marissa Nestle, MD;  Location: [facility];  Service: Urology;  Laterality: N/A;  . DENTAL SURGERY  05/19/2019  . EAR CYST EXCISION N/A 04/27/2013   Procedure: EXCISION OF SEBACEOUS CYST ON PENIS AND SCROTUM;  Surgeon: Marissa Nestle, MD;  Location: [facility];  Service: Urology;  Laterality: N/A;  . PILONIDAL CYST EXCISION N/A 04/16/2013   Procedure: CYST EXCISION PILONIDAL SIMPLE;  Surgeon: Jamesetta So, MD;  Location: [facility];  Service: General;  Laterality: N/A;       Home Medications    Prior to Admission medications   Medication Sig Start Date End Date Taking? Authorizing Provider  diazepam (VALIUM) 5 MG tablet Take 5 mg by mouth at bedtime. 01/18/20   [provider]  diltiazem (CARDIZEM LA) 180 MG 24 hr tablet Take 180 mg by mouth daily. 05/05/19   [provider]  ezetimibe (ZETIA) 10 MG tablet Take 10 mg by  mouth daily. 03/19/18   [provider]    Family History Family History  Problem Relation Age of Onset  . Heart disease Mother   . Migraines Mother   . Cancer Father   . Migraines Brother   . Heart disease Maternal Grandmother   . Migraines Maternal Grandmother   . Heart disease Maternal Grandfather   . Migraines Maternal Uncle   . Migraines Maternal Uncle   . Colon cancer Neg Hx        grandmother with colon cancer  . Colon polyps Neg Hx     Social History Social History   Tobacco Use  . Smoking status: Current Every Day Smoker    Packs/day: 1.00    Years: 10.00    Pack years: 10.00    Types: Cigarettes  . Smokeless tobacco: Never Used  Vaping Use  . Vaping Use: Former  Substance Use Topics  . Alcohol use: Never  . Drug use: Never     Allergies   Patient has no known allergies.   Review of Systems Review of Systems  Constitutional: Negative for chills and fever.  HENT: Negative for ear pain and sore throat.   Eyes: Negative for pain and visual disturbance.  Respiratory: Negative for cough and shortness of breath.   Cardiovascular: Negative for chest pain and palpitations.  Gastrointestinal: Negative for abdominal  pain and vomiting.  Genitourinary: Negative for dysuria and hematuria.  Musculoskeletal: Positive for back pain. Negative for arthralgias.  Skin: Positive for wound (small puncture wound). Negative for color change and rash.  Neurological: Negative for seizures, syncope and numbness.  All other systems reviewed and are negative.    Physical Exam Triage Vital Signs ED Triage Vitals  Enc Vitals Group     BP 07/23/20 1351 (!) 158/109     Pulse Rate 07/23/20 1351 (!) 102     Resp 07/23/20 1351 18     Temp 07/23/20 1351 98.5 F (36.9 C)     Temp Source 07/23/20 1351 Oral     SpO2 07/23/20 1351 96 %     Weight --      Height --      Head Circumference --      Peak Flow --      Pain Score 07/23/20 1348 4     Pain Loc --      Pain  Edu? --      Excl. in [place]? --    No data found.  Updated Vital Signs BP (!) 158/109 Comment: takes bp meds at night  Pulse (!) 102   Temp 98.5 F (36.9 C) (Oral)   Resp 18   SpO2 96%   Visual Acuity Right Eye Distance:   Left Eye Distance:   Bilateral Distance:    Right Eye Near:   Left Eye Near:    Bilateral Near:     Physical Exam Vitals and nursing note reviewed.  Constitutional:      Appearance: He is well-developed.  HENT:     Head: Normocephalic and atraumatic.  Eyes:     Conjunctiva/sclera: Conjunctivae normal.  Cardiovascular:     Rate and Rhythm: Normal rate and regular rhythm.     Heart sounds: No murmur heard.   Pulmonary:     Effort: Pulmonary effort is normal. No respiratory distress.     Breath sounds: Normal breath sounds.  Abdominal:     Palpations: Abdomen is soft.     Tenderness: There is no abdominal tenderness.  Musculoskeletal:     Cervical back: Neck supple.       Feet:  Skin:    General: Skin is warm and dry.  Neurological:     Mental Status: He is alert.      UC Treatments / Results  Labs (all labs ordered are listed, but only abnormal results are displayed) Labs Reviewed - No data to display  EKG   Radiology DG Lumbar Spine 2-3 Views  Result Date: 07/23/2020 CLINICAL DATA:  Chronic low back pain for 4 years.  No known injury. EXAM: LUMBAR SPINE - 2-3 VIEW COMPARISON:  None. FINDINGS: There is no evidence of lumbar spine fracture. Alignment is normal. Intervertebral disc spaces are maintained. IMPRESSION: Negative. Electronically Signed   By: Margarette Canada M.D.   On: 07/23/2020 14:23    Procedures Procedures (including critical care time)  Medications Ordered in UC Medications  Tdap (BOOSTRIX) injection 0.5 mL (0.5 mLs Intramuscular Given 07/23/20 1410)    Initial Impression / Assessment and Plan / UC Course  I have reviewed the triage vital signs and the nursing notes.  Pertinent labs & imaging results that were  available during my care of the patient were reviewed by me and considered in my medical decision making (see chart for details).     Very small puncture wound, bleeding controlled.  Pt reports he stepped on a  rusty nail, last tetanus unknown.  Tetanus given today.   Images reviewed, no acute findings.  Pt will continue steroids and follow up with ortho if sx persists.  Final Clinical Impressions(s) / UC Diagnoses   Final diagnoses:  Acute bilateral low back pain without sciatica     Discharge Instructions     Recommend antiinflammatories for back pain Exercise program including walking, stretching, core strengthening Follow up with orthopedics if sx persist/for further evaluation.    ED Prescriptions    None     PDMP not reviewed this encounter.   Konrad Felix, PA-C 07/23/20 1559

## 2020-07-23 NOTE — Discharge Instructions (Addendum)
Recommend antiinflammatories for back pain Exercise program including walking, stretching, core strengthening Follow up with orthopedics if sx persist/for further evaluation.

## 2020-07-23 NOTE — ED Triage Notes (Addendum)
Low back pain x 4-5 years.  Has gotten steroid shots and steroid pills which gives him relief for a while but would like a x ray.  Pt is currently on steroids for back. Also needs a tetanus shot from a puncture wound from a metal object today.

## 2020-08-08 ENCOUNTER — Encounter: Payer: Self-pay | Admitting: Emergency Medicine

## 2020-08-08 ENCOUNTER — Other Ambulatory Visit: Payer: Self-pay

## 2020-08-08 ENCOUNTER — Ambulatory Visit
Admission: EM | Admit: 2020-08-08 | Discharge: 2020-08-08 | Disposition: A | Discharge status: Home / Self Care | Payer: BC Managed Care – PPO | Attending: Emergency Medicine | Admitting: Emergency Medicine

## 2020-08-08 DIAGNOSIS — R590 Localized enlarged lymph nodes: Secondary | ICD-10-CM

## 2020-08-08 DIAGNOSIS — R599 Enlarged lymph nodes, unspecified: Secondary | ICD-10-CM | POA: Diagnosis not present

## 2020-08-08 MED ORDER — PREDNISONE 10 MG (21) PO TBPK: ORAL_TABLET | Freq: Every day | ORAL | 0 refills | Status: DC

## 2020-08-08 NOTE — Discharge Instructions (Signed)
Rest and push fluids Prednisone for pain and swelling Blood work ordered.  WE will follow up with you regarding abnormal results Please follow up with [facility] to establish care and have a physical Return or go to the ED if you have any new or worsening symptoms such as fever, chills, nausea, vomiting, chest pain, shortness of breath, etc..Marland Kitchen

## 2020-08-08 NOTE — ED Provider Notes (Signed)
[place]   [id number] 08/08/20 Arrival Time: 0901   CC: Lymph node swelling  SUBJECTIVE: History from: patient.  Tyler Dean is a 35 y.o. male who presents with lymph node swelling and pain, cold sweats, and chills x couple of days.  Denies sick exposure to COVID, flu or strep.  Denies alleviating factors.  Symptoms are made worse to the touch.  Reports previous symptoms in the past.  Was seen by ENT, had "camera" down throat.  Told to follow up in 6 months.   Denies fever, sinus pain, rhinorrhea, sore throat, cough, SOB, wheezing, chest pain, vomiting, changes in bowel or bladder habits, tick bites, wounds.    Does admit to tobacco use.  Has been smoking 1/2 PPD x 20 years.  Per patient he has not had a physical done in years.    ROS: As per HPI.  All other pertinent ROS negative.     Past Medical History:  Diagnosis Date  . ADHD   . Essential hypertension   . Hyperlipidemia   . Migraines    Past Surgical History:  Procedure Laterality Date  . CYSTOSCOPY N/A 04/27/2013   Procedure: CYSTOSCOPY;  Surgeon: Marissa Nestle, MD;  Location: [facility];  Service: Urology;  Laterality: N/A;  . DENTAL SURGERY  05/19/2019  . EAR CYST EXCISION N/A 04/27/2013   Procedure: EXCISION OF SEBACEOUS CYST ON PENIS AND SCROTUM;  Surgeon: Marissa Nestle, MD;  Location: [facility];  Service: Urology;  Laterality: N/A;  . PILONIDAL CYST EXCISION N/A 04/16/2013   Procedure: CYST EXCISION PILONIDAL SIMPLE;  Surgeon: Jamesetta So, MD;  Location: [facility];  Service: General;  Laterality: N/A;   No Known Allergies No current facility-administered medications on file prior to encounter.   Current Outpatient Medications on File Prior to Encounter  Medication Sig Dispense Refill  . diazepam (VALIUM) 5 MG tablet Take 5 mg by mouth at bedtime.    Marland Kitchen diltiazem (CARDIZEM LA) 180 MG 24 hr tablet Take 180 mg by mouth daily.    Marland Kitchen ezetimibe (ZETIA) 10 MG tablet Take 10 mg by mouth daily.      Social History   Socioeconomic History  . Marital status: Single    Spouse name: Not on file  . Number of children: 1  . Years of education: Not on file  . Highest education level: Associate degree: academic program  Occupational History  . Occupation: [employer]  Tobacco Use  . Smoking status: Current Every Day Smoker    Packs/day: 1.00    Years: 10.00    Pack years: 10.00    Types: Cigarettes  . Smokeless tobacco: Never Used  Vaping Use  . Vaping Use: Former  Substance and Sexual Activity  . Alcohol use: Never  . Drug use: Never  . Sexual activity: Not on file  Other Topics Concern  . Not on file  Social History Narrative   Lives at home with his child   Right handed   Caffeine: 2 cups coffee/day, 1-2 cups soda/day   Social Determinants of Health   Financial Resource Strain: Not on file  Food Insecurity: Not on file  Transportation Needs: Not on file  Physical Activity: Not on file  Stress: Not on file  Social Connections: Not on file  Intimate Partner Violence: Not on file   Family History  Problem Relation Age of Onset  . Heart disease Mother   . Migraines Mother   . Cancer Father   . Migraines Brother   .  Heart disease Maternal Grandmother   . Migraines Maternal Grandmother   . Heart disease Maternal Grandfather   . Migraines Maternal Uncle   . Migraines Maternal Uncle   . Colon cancer Neg Hx        grandmother with colon cancer  . Colon polyps Neg Hx     OBJECTIVE:  Vitals:   08/08/20 0918  BP: (!) 137/107  Pulse: (!) 102  Resp: 18  Temp: 98.5 F (36.9 C)  TempSrc: Oral  SpO2: 95%     General appearance: alert; well-appearing, nontoxic; speaking in full sentences and tolerating own secretions HEENT: NCAT; Ears: EACs clear, TMs pearly gray; Eyes: PERRL.  EOM grossly intact.Nose: nares patent without rhinorrhea, Throat: oropharynx clear, tonsils non erythematous or enlarged, uvula midline  Neck: anterior cervical chain, and with  supraclavicular lymph node swelling on LT side, no overlying erythema Lungs: unlabored respirations, symmetrical air entry; cough: absent; no respiratory distress; CTAB Heart: regular rate and rhythm.  Skin: warm and dry Psychological: alert and cooperative; normal mood and affect   ASSESSMENT & PLAN:  1. Swelling of lymph node   2. Lymphadenopathy of left cervical region     Meds ordered this encounter  Medications  . predniSONE (STERAPRED UNI-PAK 21 TAB) 10 MG (21) TBPK tablet    Sig: Take by mouth daily. Take 6 tabs by mouth daily  for 2 days, then 5 tabs for 2 days, then 4 tabs for 2 days, then 3 tabs for 2 days, 2 tabs for 2 days, then 1 tab by mouth daily for 2 days    Dispense:  42 tablet    Refill:  0    Order Specific Question:   Supervising Provider    Answer:   Raylene Everts [[id number]]    Rest and push fluids Prednisone for pain and swelling Blood work ordered.  WE will follow up with you regarding abnormal results Please follow up with [facility]CP to establish care and have a physical Return or go to the ED if you have any new or worsening symptoms such as fever, chills, nausea, vomiting, chest pain, shortness of breath, etc...  Reviewed expectations re: course of current medical issues. Questions answered. Outlined signs and symptoms indicating need for more acute intervention. Patient verbalized understanding. After Visit Summary given.         Lestine Box, PA-C 08/08/20 1024

## 2020-08-08 NOTE — ED Triage Notes (Signed)
States lymph nodes on left side are painful and swollen.  Having cold sweats over the past couple of days.

## 2020-08-09 ENCOUNTER — Telehealth: Payer: Self-pay

## 2020-08-09 LAB — CBC WITH DIFFERENTIAL/PLATELET
Basophils Absolute: 0.1 10*3/uL (ref 0.0–0.2)
Basos: 1 %
EOS (ABSOLUTE): 0.3 10*3/uL (ref 0.0–0.4)
Eos: 3 %
Hematocrit: 47.4 % (ref 37.5–51.0)
Hemoglobin: 16.4 g/dL (ref 13.0–17.7)
Immature Grans (Abs): 0 10*3/uL (ref 0.0–0.1)
Immature Granulocytes: 0 %
Lymphocytes Absolute: 2.3 10*3/uL (ref 0.7–3.1)
Lymphs: 25 %
MCH: 31.1 pg (ref 26.6–33.0)
MCHC: 34.6 g/dL (ref 31.5–35.7)
MCV: 90 fL (ref 79–97)
Monocytes Absolute: 0.8 10*3/uL (ref 0.1–0.9)
Monocytes: 9 %
Neutrophils Absolute: 5.8 10*3/uL (ref 1.4–7.0)
Neutrophils: 62 %
Platelets: 310 10*3/uL (ref 150–450)
RBC: 5.27 x10E6/uL (ref 4.14–5.80)
RDW: 12.4 % (ref 11.6–15.4)
WBC: 9.3 10*3/uL (ref 3.4–10.8)

## 2020-08-09 NOTE — Telephone Encounter (Signed)
Called pt and let him know we can no accept him as a new patient

## 2020-10-12 ENCOUNTER — Encounter: Payer: Self-pay | Admitting: Emergency Medicine

## 2020-10-12 ENCOUNTER — Ambulatory Visit
Admission: EM | Admit: 2020-10-12 | Discharge: 2020-10-12 | Disposition: A | Discharge status: Home / Self Care | Payer: BC Managed Care – PPO | Attending: Emergency Medicine | Admitting: Emergency Medicine

## 2020-10-12 DIAGNOSIS — J029 Acute pharyngitis, unspecified: Secondary | ICD-10-CM | POA: Insufficient documentation

## 2020-10-12 DIAGNOSIS — B370 Candidal stomatitis: Secondary | ICD-10-CM | POA: Insufficient documentation

## 2020-10-12 LAB — POCT RAPID STREP A (OFFICE): Rapid Strep A Screen: NEGATIVE

## 2020-10-12 MED ORDER — NYSTATIN 100000 UNIT/ML MT SUSP: 5.0000 mL | Freq: Three times a day (TID) | OROMUCOSAL | 0 refills | Status: DC

## 2020-10-12 NOTE — Discharge Instructions (Addendum)
Strep test negative, will send out for culture and we will call you with results Based on symptoms and recent antibiotic use will cover for thrush Get plenty of rest and push fluids Drink warm or cool liquids, use throat lozenges, or popsicles to help alleviate symptoms Take OTC ibuprofen or tylenol as needed for pain Follow up with ENT for further evaluation and management Return or go to ER if patient has any new or worsening symptoms such as fever, chills, nausea, vomiting, worsening sore throat, cough, abdominal pain, chest pain, changes in bowel or bladder habits, etc..Marland Kitchen

## 2020-10-12 NOTE — ED Triage Notes (Signed)
Sore throat x 3 to 4 weeks.  Has tried over the counter medications without relief.

## 2020-10-12 NOTE — ED Provider Notes (Signed)
[place]   [id number] 10/12/20 Arrival Time: 1024  IZ:TIWP THROAT  SUBJECTIVE: History from: patient.  Tyler Dean is a 35 y.o. male who presents with abrupt onset of sore throat x 3-4 weeks.  Does admit to recent antibiotic use for dental infection.  Has tried OTC medications without relief.  Symptoms are made worse with swallowing, but tolerating liquids and own secretions without difficulty.  Denies previous symptoms in the past.   Denies fever, chills, fatigue, ear pain, sinus pain, rhinorrhea, nasal congestion, cough, SOB, wheezing, chest pain, nausea, rash, changes in bowel or bladder habits.     ROS: As per HPI.  All other pertinent ROS negative.     Past Medical History:  Diagnosis Date   ADHD    Essential hypertension    Hyperlipidemia    Migraines    Past Surgical History:  Procedure Laterality Date   CYSTOSCOPY N/A 04/27/2013   Procedure: CYSTOSCOPY;  Surgeon: Marissa Nestle, MD;  Location: [facility];  Service: Urology;  Laterality: N/A;   DENTAL SURGERY  05/19/2019   EAR CYST EXCISION N/A 04/27/2013   Procedure: EXCISION OF SEBACEOUS CYST ON PENIS AND SCROTUM;  Surgeon: Marissa Nestle, MD;  Location: [facility];  Service: Urology;  Laterality: N/A;   PILONIDAL CYST EXCISION N/A 04/16/2013   Procedure: CYST EXCISION PILONIDAL SIMPLE;  Surgeon: Jamesetta So, MD;  Location: [facility];  Service: General;  Laterality: N/A;   No Known Allergies No current facility-administered medications on file prior to encounter.   Current Outpatient Medications on File Prior to Encounter  Medication Sig Dispense Refill   diltiazem (CARDIZEM LA) 180 MG 24 hr tablet Take 180 mg by mouth daily.     ezetimibe (ZETIA) 10 MG tablet Take 10 mg by mouth daily.     Social History   Socioeconomic History   Marital status: Single    Spouse name: Not on file   Number of children: 1   Years of education: Not on file   Highest education level: Associate degree: academic  program  Occupational History   Occupation: [employer]  Tobacco Use   Smoking status: Every Day    Packs/day: 1.00    Years: 10.00    Pack years: 10.00    Types: Cigarettes   Smokeless tobacco: Never  Vaping Use   Vaping Use: Former  Substance and Sexual Activity   Alcohol use: Never   Drug use: Never   Sexual activity: Not on file  Other Topics Concern   Not on file  Social History Narrative   Lives at home with his child   Right handed   Caffeine: 2 cups coffee/day, 1-2 cups soda/day   Social Determinants of Health   Financial Resource Strain: Not on file  Food Insecurity: Not on file  Transportation Needs: Not on file  Physical Activity: Not on file  Stress: Not on file  Social Connections: Not on file  Intimate Partner Violence: Not on file   Family History  Problem Relation Age of Onset   Heart disease Mother    Migraines Mother    Cancer Father    Migraines Brother    Heart disease Maternal Grandmother    Migraines Maternal Grandmother    Heart disease Maternal Grandfather    Migraines Maternal Uncle    Migraines Maternal Uncle    Colon cancer Neg Hx        grandmother with colon cancer   Colon polyps Neg Hx  OBJECTIVE:  Vitals:   10/12/20 1032  BP: (!) 152/104  Pulse: 86  Resp: 16  Temp: 97.8 F (36.6 C)  TempSrc: Oral  SpO2: 95%    General appearance: alert; well-appearing, nontoxic; speaking in full sentences and tolerating own secretions HEENT: NCAT; Ears: EACs clear, TMs pearly gray; Eyes: PERRL.  EOM grossly intact.Nose: nares patent without rhinorrhea, Throat: oropharynx clear, tonsils erythematous not enlarged, uvula midline, tongue and oropharynx appear erythematous Neck: supple without LAD Lungs: unlabored respirations, symmetrical air entry; cough: absent; no respiratory distress; CTAB Heart: regular rate and rhythm.  Skin: warm and dry Psychological: alert and cooperative; normal mood and affect   LABS: Results for orders placed or  performed during the hospital encounter of 10/12/20 (from the past 24 hour(s))  POCT rapid strep A     Status: None   Collection Time: 10/12/20 10:44 AM  Result Value Ref Range   Rapid Strep A Screen Negative Negative     ASSESSMENT & PLAN:  1. Sore throat   2. Thrush     Meds ordered this encounter  Medications   magic mouthwash (nystatin, lidocaine, diphenhydrAMINE, alum & mag hydroxide) suspension    Sig: Swish and spit 5 mLs 3 (three) times daily.    Dispense:  180 mL    Refill:  0    Order Specific Question:   Supervising Provider    Answer:   Raylene Everts [[id number]]    Strep test negative, will send out for culture and we will call you with results Based on symptoms and recent antibiotic use will cover for thrush Get plenty of rest and push fluids Drink warm or cool liquids, use throat lozenges, or popsicles to help alleviate symptoms Take OTC ibuprofen or tylenol as needed for pain Follow up with ENT for further evaluation and management Return or go to ER if patient has any new or worsening symptoms such as fever, chills, nausea, vomiting, worsening sore throat, cough, abdominal pain, chest pain, changes in bowel or bladder habits, etc...  Reviewed expectations re: course of current medical issues. Questions answered. Outlined signs and symptoms indicating need for more acute intervention. Patient verbalized understanding. After Visit Summary given.         Lestine Box, PA-C 10/12/20 1114

## 2020-10-15 LAB — CULTURE, GROUP A STREP (THRC)

## 2020-10-16 ENCOUNTER — Telehealth: Payer: Self-pay | Admitting: Family Medicine

## 2020-10-16 MED ORDER — AMOXICILLIN 875 MG PO TABS: 875.0000 mg | ORAL_TABLET | Freq: Two times a day (BID) | ORAL | 0 refills | Status: AC

## 2020-10-16 NOTE — Telephone Encounter (Signed)
Pt still with ST. Culture with non-group A strep. Given he is feeling worse, will go ahead and tx with: Meds ordered this encounter  Medications   amoxicillin (AMOXIL) 875 MG tablet    Sig: Take 1 tablet (875 mg total) by mouth 2 (two) times daily for 10 days.    Dispense:  20 tablet    Refill:  0

## 2020-11-28 ENCOUNTER — Ambulatory Visit: Payer: BC Managed Care – PPO | Admitting: General Surgery

## 2020-11-28 ENCOUNTER — Other Ambulatory Visit: Payer: Self-pay

## 2020-11-28 ENCOUNTER — Encounter: Payer: Self-pay | Admitting: General Surgery

## 2020-11-28 VITALS — BP 140/92 | HR 81 | Temp 98.5°F | Resp 16 | Ht 72.0 in | Wt 221.0 lb

## 2020-11-28 DIAGNOSIS — L0591 Pilonidal cyst without abscess: Secondary | ICD-10-CM | POA: Diagnosis not present

## 2020-11-29 NOTE — Progress Notes (Signed)
Tyler Dean; [id number]; [birth year]   HPI Patient is a 35 year old white male who referred himself to my care for evaluation of a pilonidal cyst.  He is status post excision of pilonidal cyst by myself in 2014.  He states that the wound had to be packed at the time.  Recently, he had an episode where he had some swelling and tenderness in the pilonidal region.  It did resolve while taking an antibiotic for an unrelated issue.  He denies any recent drainage from the pilonidal region.  It is not currently symptomatic. Past Medical History:  Diagnosis Date   ADHD    Essential hypertension    Hyperlipidemia    Migraines     Past Surgical History:  Procedure Laterality Date   CYSTOSCOPY N/A 04/27/2013   Procedure: CYSTOSCOPY;  Surgeon: Marissa Nestle, MD;  Location: [facility];  Service: Urology;  Laterality: N/A;   DENTAL SURGERY  05/19/2019   EAR CYST EXCISION N/A 04/27/2013   Procedure: EXCISION OF SEBACEOUS CYST ON PENIS AND SCROTUM;  Surgeon: Marissa Nestle, MD;  Location: [facility];  Service: Urology;  Laterality: N/A;   PILONIDAL CYST EXCISION N/A 04/16/2013   Procedure: CYST EXCISION PILONIDAL SIMPLE;  Surgeon: Jamesetta So, MD;  Location: [facility];  Service: General;  Laterality: N/A;    Family History  Problem Relation Age of Onset   Heart disease Mother    Migraines Mother    Cancer Father    Migraines Brother    Heart disease Maternal Grandmother    Migraines Maternal Grandmother    Heart disease Maternal Grandfather    Migraines Maternal Uncle    Migraines Maternal Uncle    Colon cancer Neg Hx        grandmother with colon cancer   Colon polyps Neg Hx     Current Outpatient Medications on File Prior to Visit  Medication Sig Dispense Refill   diltiazem (CARDIZEM LA) 180 MG 24 hr tablet Take 180 mg by mouth daily.     ezetimibe (ZETIA) 10 MG tablet Take 10 mg by mouth daily.     No current facility-administered medications on file prior to visit.    No Known  Allergies  Social History   Substance and Sexual Activity  Alcohol Use Never    Social History   Tobacco Use  Smoking Status Every Day   Packs/day: 1.00   Years: 10.00   Pack years: 10.00   Types: Cigarettes  Smokeless Tobacco Never    Review of Systems  Constitutional: Negative.   HENT: Negative.    Eyes: Negative.   Respiratory: Negative.    Cardiovascular: Negative.   Gastrointestinal: Negative.   Genitourinary: Negative.   Musculoskeletal: Negative.   Skin: Negative.   Neurological: Negative.   Endo/Heme/Allergies: Negative.   Psychiatric/Behavioral: Negative.     Objective   Vitals:   11/28/20 1117  BP: (!) 140/92  Pulse: 81  Resp: 16  Temp: 98.5 F (36.9 C)  SpO2: 97%    Physical Exam Vitals reviewed.  Constitutional:      Appearance: Normal appearance. He is normal weight. He is not ill-appearing.  HENT:     Head: Normocephalic and atraumatic.  Cardiovascular:     Rate and Rhythm: Normal rate and regular rhythm.     Heart sounds: Normal heart sounds. No murmur heard.   No friction rub. No gallop.  Pulmonary:     Effort: Pulmonary effort is normal. No respiratory distress.  Breath sounds: Normal breath sounds. No stridor. No wheezing, rhonchi or rales.  Skin:    General: Skin is warm and dry.     Comments: There are 2 open areas that appear chronic in nature with hair emanating out of one of the openings over the coccyx region.  No induration is noted.  No purulent drainage is noted.  Neurological:     Mental Status: He is alert and oriented to person, place, and time.    Assessment  Pilonidal cyst, currently not infected Plan  I told the patient that should he want reexcision of the pilonidal cyst, I would be more happy to perform that.  He understands that but would like to wait on any surgical intervention.  He was only concerned about possible infection.  That is not present.  Follow-up with me as needed.

## 2020-12-28 ENCOUNTER — Encounter (HOSPITAL_COMMUNITY): Payer: Self-pay | Admitting: *Deleted

## 2020-12-28 ENCOUNTER — Emergency Department (HOSPITAL_COMMUNITY): Payer: BC Managed Care – PPO

## 2020-12-28 ENCOUNTER — Other Ambulatory Visit: Payer: Self-pay

## 2020-12-28 ENCOUNTER — Emergency Department (HOSPITAL_COMMUNITY)
Admission: EM | Admit: 2020-12-28 | Discharge: 2020-12-28 | Disposition: A | Discharge status: Home / Self Care | Payer: BC Managed Care – PPO | Attending: Emergency Medicine | Admitting: Emergency Medicine

## 2020-12-28 DIAGNOSIS — R0600 Dyspnea, unspecified: Secondary | ICD-10-CM | POA: Diagnosis not present

## 2020-12-28 DIAGNOSIS — Z79899 Other long term (current) drug therapy: Secondary | ICD-10-CM | POA: Diagnosis not present

## 2020-12-28 DIAGNOSIS — R1010 Upper abdominal pain, unspecified: Secondary | ICD-10-CM | POA: Diagnosis not present

## 2020-12-28 DIAGNOSIS — R079 Chest pain, unspecified: Secondary | ICD-10-CM | POA: Diagnosis not present

## 2020-12-28 DIAGNOSIS — Z20822 Contact with and (suspected) exposure to covid-19: Secondary | ICD-10-CM | POA: Insufficient documentation

## 2020-12-28 DIAGNOSIS — R5383 Other fatigue: Secondary | ICD-10-CM | POA: Diagnosis not present

## 2020-12-28 DIAGNOSIS — R0602 Shortness of breath: Secondary | ICD-10-CM | POA: Diagnosis not present

## 2020-12-28 DIAGNOSIS — I10 Essential (primary) hypertension: Secondary | ICD-10-CM | POA: Diagnosis not present

## 2020-12-28 DIAGNOSIS — F17210 Nicotine dependence, cigarettes, uncomplicated: Secondary | ICD-10-CM | POA: Diagnosis not present

## 2020-12-28 LAB — COMPREHENSIVE METABOLIC PANEL
ALT: 60 U/L — ABNORMAL HIGH (ref 0–44)
AST: 29 U/L (ref 15–41)
Albumin: 4.7 g/dL (ref 3.5–5.0)
Alkaline Phosphatase: 69 U/L (ref 38–126)
Anion gap: 4 — ABNORMAL LOW (ref 5–15)
BUN: 13 mg/dL (ref 6–20)
CO2: 25 mmol/L (ref 22–32)
Calcium: 8.9 mg/dL (ref 8.9–10.3)
Chloride: 105 mmol/L (ref 98–111)
Creatinine, Ser: 0.71 mg/dL (ref 0.61–1.24)
GFR, Estimated: >60 mL/min (ref >60)
Glucose, Bld: 85 mg/dL (ref 70–99)
Potassium: 4.1 mmol/L (ref 3.5–5.1)
Sodium: 134 mmol/L — ABNORMAL LOW (ref 135–145)
Total Bilirubin: 0.5 mg/dL (ref 0.3–1.2)
Total Protein: 8 g/dL (ref 6.5–8.1)

## 2020-12-28 LAB — CBC WITH DIFFERENTIAL/PLATELET
Abs Immature Granulocytes: 0.02 10*3/uL (ref 0.00–0.07)
Basophils Absolute: 0.1 10*3/uL (ref 0.0–0.1)
Basophils Relative: 1 %
Eosinophils Absolute: 0.3 10*3/uL (ref 0.0–0.5)
Eosinophils Relative: 4 %
HCT: 47 % (ref 39.0–52.0)
Hemoglobin: 15.9 g/dL (ref 13.0–17.0)
Immature Granulocytes: 0 %
Lymphocytes Relative: 34 %
Lymphs Abs: 2.5 10*3/uL (ref 0.7–4.0)
MCH: 31.5 pg (ref 26.0–34.0)
MCHC: 33.8 g/dL (ref 30.0–36.0)
MCV: 93.1 fL (ref 80.0–100.0)
Monocytes Absolute: 0.5 10*3/uL (ref 0.1–1.0)
Monocytes Relative: 7 %
Neutro Abs: 3.9 10*3/uL (ref 1.7–7.7)
Neutrophils Relative %: 54 %
Platelets: 297 10*3/uL (ref 150–400)
RBC: 5.05 MIL/uL (ref 4.22–5.81)
RDW: 12.3 % (ref 11.5–15.5)
WBC: 7.3 10*3/uL (ref 4.0–10.5)
nRBC: 0 % (ref 0.0–0.2)

## 2020-12-28 LAB — RESP PANEL BY RT-PCR (FLU A&B, COVID) ARPGX2
Influenza A by PCR: NEGATIVE
Influenza B by PCR: NEGATIVE
SARS Coronavirus 2 by RT PCR: NEGATIVE

## 2020-12-28 LAB — LIPASE, BLOOD: Lipase: 28 U/L (ref 11–51)

## 2020-12-28 LAB — TROPONIN I (HIGH SENSITIVITY)
Troponin I (High Sensitivity): <2 ng/L (ref <18)
Troponin I (High Sensitivity): 2 ng/L (ref <18)

## 2020-12-28 MED ORDER — IOHEXOL 350 MG/ML SOLN
85.0000 mL | Freq: Once | INTRAVENOUS | Status: AC | PRN
  Administered 2020-12-28: 85 mL via INTRAVENOUS

## 2020-12-28 NOTE — ED Provider Notes (Signed)
[facility] EMERGENCY DEPARTMENT Provider Note   CSN: [id number] Arrival date & time: 12/28/20  1127     History Chief Complaint  Patient presents with   Shortness of Breath    Tyler Dean is a 35 y.o. male.   Shortness of Breath Associated symptoms: abdominal pain and chest pain   Associated symptoms: no cough, no fever, no rash, no vomiting and no wheezing       GIAVONNI Dean is a 35 y.o. male PMH significant for HTN, who presents to the Emergency Department complaining of  shortness of breath, fatigue and left sided chest pain.  Symptoms have been present for 3 days.  Shortness of breath mostly exertional.  Chest pain does not radiate to his neck, arm or jaw.  No diaphoresis.  He also complains of waxing and waning pain across his upper abdomen.  Abdominal pain not exacerbated by food intake. He denies nausea, vomiting, diarrhea, cough, fever or chills.  Nothing makes his symptoms better or worse. No hx of PE or heart disease   Past Medical History:  Diagnosis Date   ADHD    Essential hypertension    Hyperlipidemia    Migraines     Patient Active Problem List   Diagnosis Date Noted   Chronic migraine without aura without status migrainosus, not intractable 05/24/2019   Morning headache 05/24/2019   Elevated LFTs 08/18/2018   LLQ pain 08/18/2018    Past Surgical History:  Procedure Laterality Date   CYSTOSCOPY N/A 04/27/2013   Procedure: CYSTOSCOPY;  Surgeon: Marissa Nestle, MD;  Location: [facility];  Service: Urology;  Laterality: N/A;   DENTAL SURGERY  05/19/2019   EAR CYST EXCISION N/A 04/27/2013   Procedure: EXCISION OF SEBACEOUS CYST ON PENIS AND SCROTUM;  Surgeon: Marissa Nestle, MD;  Location: [facility];  Service: Urology;  Laterality: N/A;   PILONIDAL CYST EXCISION N/A 04/16/2013   Procedure: CYST EXCISION PILONIDAL SIMPLE;  Surgeon: Jamesetta So, MD;  Location: [facility];  Service: General;  Laterality: N/A;       Family History  Problem Relation  Age of Onset   Heart disease Mother    Migraines Mother    Cancer Father    Migraines Brother    Heart disease Maternal Grandmother    Migraines Maternal Grandmother    Heart disease Maternal Grandfather    Migraines Maternal Uncle    Migraines Maternal Uncle    Colon cancer Neg Hx        grandmother with colon cancer   Colon polyps Neg Hx     Social History   Tobacco Use   Smoking status: Every Day    Packs/day: 1.00    Years: 10.00    Pack years: 10.00    Types: Cigarettes   Smokeless tobacco: Never  Vaping Use   Vaping Use: Former  Substance Use Topics   Alcohol use: Never   Drug use: Never    Home Medications Prior to Admission medications   Medication Sig Start Date End Date Taking? Authorizing Provider  diltiazem (CARDIZEM LA) 180 MG 24 hr tablet Take 180 mg by mouth daily. 05/05/19   [provider]  ezetimibe (ZETIA) 10 MG tablet Take 10 mg by mouth daily. 03/19/18   [provider]    Allergies    Patient has no known allergies.  Review of Systems   Review of Systems  Constitutional:  Negative for appetite change and fever.  Respiratory:  Positive for shortness  of breath. Negative for cough and wheezing.   Cardiovascular:  Positive for chest pain. Negative for leg swelling.  Gastrointestinal:  Positive for abdominal pain. Negative for diarrhea, nausea and vomiting.  Genitourinary:  Negative for dysuria.  Musculoskeletal:  Negative for back pain and myalgias.  Skin:  Negative for rash.  Neurological:  Negative for dizziness, weakness, light-headedness and numbness.   Physical Exam Updated Vital Signs BP 129/88   Pulse 80   Temp 98 F (36.7 C) (Oral)   Resp 16   Ht '5\' 11"'$  (1.803 m)   Wt 102.1 kg   SpO2 97%   BMI 31.38 kg/m   Physical Exam Vitals and nursing note reviewed.  Constitutional:      General: He is not in acute distress.    Appearance: Normal appearance. He is well-developed.  HENT:     Head: Normocephalic.   Cardiovascular:     Rate and Rhythm: Normal rate and regular rhythm.     Pulses: Normal pulses.  Pulmonary:     Effort: Pulmonary effort is normal. No respiratory distress.     Breath sounds: Normal breath sounds.  Abdominal:     Palpations: Abdomen is soft.     Tenderness: There is abdominal tenderness. There is no right CVA tenderness, left CVA tenderness, guarding or rebound.  Musculoskeletal:        General: Normal range of motion.     Cervical back: Normal range of motion.     Right lower leg: No edema.     Left lower leg: No edema.  Skin:    General: Skin is warm.     Capillary Refill: Capillary refill takes less than 2 seconds.     Findings: No rash.  Neurological:     General: No focal deficit present.     Mental Status: He is alert.     Sensory: No sensory deficit.     Motor: No weakness.    ED Results / Procedures / Treatments   Labs (all labs ordered are listed, but only abnormal results are displayed) Labs Reviewed  COMPREHENSIVE METABOLIC PANEL - Abnormal; Notable for the following components:      Result Value   Sodium 134 (*)    ALT 60 (*)    Anion gap 4 (*)    All other components within normal limits  RESP PANEL BY RT-PCR (FLU A&B, COVID) ARPGX2  CBC WITH DIFFERENTIAL/PLATELET  LIPASE, BLOOD  TROPONIN I (HIGH SENSITIVITY)  TROPONIN I (HIGH SENSITIVITY)    EKG EKG Interpretation  Date/Time:  Thursday December 28 2020 16:07:50 EDT Ventricular Rate:  75 PR Interval:  134 QRS Duration: 78 QT Interval:  408 QTC Calculation: 455 R Axis:   29 Text Interpretation: Normal sinus rhythm Normal ECG similar to prior No STEMI Confirmed by Antony Blackbird [phone]) on 12/29/2020 12:20:42 PM  Radiology DG Chest 2 View  Result Date: 12/28/2020 CLINICAL DATA:  Short of breath EXAM: CHEST - 2 VIEW COMPARISON:  02/22/2020 FINDINGS: Mild linear density in the lingula has improved most compatible with scarring. Cardiac and mediastinal contours normal. No heart  failure. Negative for infiltrate or effusion. IMPRESSION: Mild scarring in the lingula, improved from the prior study. No acute abnormality. Electronically Signed   By: Franchot Gallo M.D.   On: 12/28/2020 12:52   CT ABDOMEN PELVIS W CONTRAST  Result Date: 12/28/2020 CLINICAL DATA:  Abdominal pain and nausea. EXAM: CT ABDOMEN AND PELVIS WITH CONTRAST TECHNIQUE: Multidetector CT imaging of the abdomen and pelvis was  performed using the standard protocol following bolus administration of intravenous contrast. CONTRAST:  97m OMNIPAQUE IOHEXOL 350 MG/ML SOLN COMPARISON:  08/30/2019 FINDINGS: Lower chest: No acute abnormality. Hepatobiliary: Stable tiny cystic area in the upper liver near the intrahepatic IVC. No gallstones, gallbladder wall thickening, or biliary dilatation. Pancreas: Unremarkable. No pancreatic ductal dilatation or surrounding inflammatory changes. Spleen: Normal in size without focal abnormality. Adrenals/Urinary Tract: Normal adrenal glands. No evidence of hydronephrosis or renal calculi. Stable cysts emanating from the lower pole of the left kidney. The bladder is unremarkable. Stomach/Bowel: Bowel shows no evidence of obstruction, ileus, inflammation or lesion. The appendix is normal. There is mild diverticulosis of the sigmoid colon without diverticulitis. Vascular/Lymphatic: No significant vascular findings are present. No enlarged abdominal or pelvic lymph nodes. Reproductive: Prostate is unremarkable. Other: No abdominal wall hernia or abnormality. No abdominopelvic ascites. Musculoskeletal: No acute or significant osseous findings. IMPRESSION: No acute findings in the abdomen or pelvis. Electronically Signed   By: GAletta EdouardM.D.   On: 12/28/2020 16:08    Procedures Procedures   Medications Ordered in ED Medications  iohexol (OMNIPAQUE) 350 MG/ML injection 85 mL (85 mLs Intravenous Contrast Given 12/28/20 1518)    ED Course  I have reviewed the triage vital signs and the  nursing notes.  Pertinent labs & imaging results that were available during my care of the patient were reviewed by me and considered in my medical decision making (see chart for details).    MDM Rules/Calculators/A&P                          Patient here with 3-day history of left-sided chest pain, abdominal pain and dyspnea.  Dyspnea is mostly exertional.  Denies any recent illness, cough, or history of pneumonia.  Patient is a cigarette smoker of half pack per day.  No hx of ACS.  He is PERC negative, clinical suspicion for PE is low.  On review of medical records, patient was seen by cardiology October 2021.  No echo or stress test seen in medical records.on further history, pt admits that stress test has been recommended, but he has not made appt.    On exam, patient has some mild diffuse tenderness of the upper abdomen without guarding or rebound.  He also has reproducible tenderness of the lateral left chest wall.  He also endorses some pleuritic pain.  He is well-appearing.  Vital signs reassuring.  Work-up today is overall reassuring.  No leukocytosis, lipase unremarkable, chemistries without significant changes.  Delta troponin unchanged.  COVID test negative.  Chest x-ray and CT abdomen pelvis without acute changes. No acute EKG changes.  Source of pt's sx's unclear.   Patient also seen by Dr. NKathrynn Humbleand care plan discussed.  Pt appears appropriate for d/c home.  He is agreeable to arrange close out pt f/u with cardiology.  Return precautions also discussed.    Final Clinical Impression(s) / ED Diagnoses Final diagnoses:  Shortness of breath  Pain of upper abdomen    Rx / DC Orders ED Discharge Orders     None        TKem Parkinson PA-C 12/31/20 1[place] Gardner Ankit, MD 12/31/20 1114

## 2020-12-28 NOTE — ED Triage Notes (Signed)
Shortness of breath for the past 3 days. Also has pain in left side of chest

## 2020-12-28 NOTE — Discharge Instructions (Addendum)
Overall, your work-up today was reassuring.  Since you are having some chest pain I recommend that you call your cardiologist tomorrow to arrange a follow-up appointment.  Return to the emergency department if you develop any new or worsening symptoms.

## 2020-12-28 NOTE — ED Notes (Signed)
Patient refused ekg in triage due to issues with hair growing back

## 2021-01-04 NOTE — Progress Notes (Signed)
Cardiology Office Note  Date: 01/05/2021   ID: Tyler Dean 1985-07-24, MRN [id number]  PCP:  Lavella Lemons, PA  Cardiologist:  Rozann Lesches, MD Electrophysiologist:  None   Chief Complaint: ER follow-up / PCP called stating patient continues to have chest pain on and off  History of Present Illness: Tyler Dean is a 35 y.o. male with a history of HTN, severe HLD, tobacco use . He was last seen by Dr. Domenic Polite on 02/29/2020.  He had a history of hyperlipidemia and chest discomfort.  His most recent LDL was 209.  He had a history of multi statin intolerance with muscle aches and weakness.  He had been on Zetia most recently.  He reported intermittent chest pressure, nonexertional.  Noticed it more when sitting still and better when busy.  He had been seen in the emergency room on February 22, 2020.  Troponin levels were normal.  EKG showed no acute ST segment changes.  Chest x-ray without acute findings.  He reported intermittent lightheadedness and intermittent visual changes.  Head CT was negative for acute findings.  No documented arrhythmias.  Plan was to undergo a [employer].  Discussion took place regarding referral to lipid clinic to evaluate for possibility of PCSK9 inhibitor given significantly elevated LDL of 209.  He was currently on Cardizem LA 180 mg daily for hypertension.  Smoking cessation was discussed  He recently presented to [facility] emergency department on 12/28/2020 with complaints of shortness of breath fatigue and left-sided chest pain.  Symptoms had been present for 3 days.  Described shortness of breath as mostly exertional.  Chest pain was without radiation or diaphoresis.  Also complained of waxing and waning pain across upper abdomen.  Abdominal pain not exacerbated by food intake.  He denied any nausea, vomiting, diarrhea, cough, fever, chills.  There were no aggravating or alleviating factors.  Every day smoker x 2 years.  EKG normal sinus rhythm  rate of 75.  He had some mild diffuse tenderness of upper abdomen without guarding or rebound.  Had reproducible tenderness on the left lateral chest wall.  Endorsed some pleuritic pain.  CT of abdomen pelvis no acute findings in abdomen or pelvis.  Chest x-ray mild scarring in the lingula improved from the prior study, no acute abnormality.  Chemistry showed sodium of 134, ALT of 60, anion gap of 4, troponin 2, second troponin less than 2, lipase 28, fluoroscopy was PCR negative, influenza negative.  Strep negative.  CBC unremarkable   He is here for follow-up secondary to recurring chest pain.  Had recent visit to [facility] emergency department as noted above.  He states that chest pain 8.  Is constant and can occur with and without activity.  Denies any radiation to neck, arm, back, jaw. He does have some associated nausea and diaphoresis at times.  He states over the last few weeks he has noticed increasing shortness of breath/DOE.  He is a long-term smoker and states he has smoked approximately 20 years.  Currently smoking.  He states he recently had a test in PCP office which sounds like a spirometry test.  He states he started Nexium recently and symptoms seem to be improving some.  He is not sure if improvement is attributable to the medication or not.  He has a history of statin intolerance and during previous visit plans were to refer to our hyperlipidemia clinic.  During previous visit plans were to have him undergo a  stress test which apparently was never done.  He states he has a stress test scheduled in the upcoming weeks.  States he has gained some weight recently.  States his baseline weight has usually been around 180-185.  Today's weight he weighs 222.  States he feels tired all the time.  He denies any snoring symptoms, apnea or hyper apneic episode.  He states he sleeps adequately.  He states lab work from [employer] at work have been good.  Denies any recent abnormal  labs. .  Past Medical History:  Diagnosis Date   ADHD    Essential hypertension    Hyperlipidemia    Migraines     Past Surgical History:  Procedure Laterality Date   CYSTOSCOPY N/A 04/27/2013   Procedure: CYSTOSCOPY;  Surgeon: Marissa Nestle, MD;  Location: [facility];  Service: Urology;  Laterality: N/A;   DENTAL SURGERY  05/19/2019   EAR CYST EXCISION N/A 04/27/2013   Procedure: EXCISION OF SEBACEOUS CYST ON PENIS AND SCROTUM;  Surgeon: Marissa Nestle, MD;  Location: [facility];  Service: Urology;  Laterality: N/A;   PILONIDAL CYST EXCISION N/A 04/16/2013   Procedure: CYST EXCISION PILONIDAL SIMPLE;  Surgeon: Jamesetta So, MD;  Location: [facility];  Service: General;  Laterality: N/A;    Current Outpatient Medications  Medication Sig Dispense Refill   albuterol (VENTOLIN HFA) 108 (90 Base) MCG/ACT inhaler Inhale 1-2 puffs into the lungs as needed.     diltiazem (CARDIZEM LA) 180 MG 24 hr tablet Take 180 mg by mouth daily.     esomeprazole (NEXIUM) 40 MG capsule Take 40 mg by mouth daily at 12 noon. Started yesterday.     ezetimibe (ZETIA) 10 MG tablet Take 10 mg by mouth daily.     No current facility-administered medications for this visit.   Allergies:  Patient has no known allergies.   Social History: The patient  reports that he has been smoking cigarettes. He has a 10.00 pack-year smoking history. He has never used smokeless tobacco. He reports that he does not drink alcohol and does not use drugs.   Family History: The patient's family history includes Cancer in his father; Heart disease in his maternal grandfather, maternal grandmother, and mother; Migraines in his brother, maternal grandmother, maternal uncle, maternal uncle, and mother.   ROS:  Please see the history of present illness. Otherwise, complete review of systems is positive for none.  All other systems are reviewed and negative.   Physical Exam: VS:  BP (!) 134/98   Pulse 88   Ht 6' (1.829 m)   Wt 222  lb (100.7 kg)   SpO2 95%   BMI 30.11 kg/m , BMI Body mass index is 30.11 kg/m.  Wt Readings from Last 3 Encounters:  01/05/21 222 lb (100.7 kg)  12/28/20 225 lb (102.1 kg)  11/28/20 221 lb (100.2 kg)    General: Patient appears comfortable at rest. Neck: Supple, no elevated JVP or carotid bruits, no thyromegaly. Lungs: Clear to auscultation, nonlabored breathing at rest. Cardiac: Regular rate and rhythm, no S3 or significant systolic murmur, no pericardial rub. Extremities: No pitting edema, distal pulses 2+. Skin: Warm and dry. Musculoskeletal: No kyphosis. Neuropsychiatric: Alert and oriented x3, affect grossly appropriate.  ECG:  EKG 12/28/2020 normal sinus rhythm rate of 75  Recent Labwork: 12/28/2020: ALT 60; AST 29; BUN 13; Creatinine, Ser 0.71; Hemoglobin 15.9; Platelets 297; Potassium 4.1; Sodium 134  No results found for: CHOL, TRIG, HDL, CHOLHDL, VLDL, LDLCALC,  LDLDIRECT  Other Studies Reviewed Today:   Assessment and Plan:  1. Chest pain of uncertain etiology   2. SOB (shortness of breath)   3. Essential hypertension   4. Mixed hyperlipidemia   5. Tobacco abuse    1. Chest pain of uncertain etiology Recent presentation to the emergency room with chest pain on and off primarily located in the left anterior chest without radiation to neck, arm, back or jaw.  He states at times the pain is constant.  States it is not necessarily associated with exertion and can occur at rest.  States he does have some associated nausea and diaphoresis at times.  He states he was recently started on Nexium and it seems to have relieved his symptoms to some degree.  He states he is not sure relief is associated with starting the medication or not.  He already has a scheduled stress test upcoming.  2. SOB (shortness of breath) Reports several weeks of increasing shortness of breath which is new for him.  Shortness of breath can occur at rest but worse with exertion.   He states he had a test  at PCP office which sounds like a spirometry.  He does not know the results of the test.  Has a long-term history of smoking for approximately 20 years per his statement.  Current smoker.  Please get an echocardiogram.  3. Essential hypertension Blood pressure today 134/98.  He states blood pressure has been elevated some recently.  Currently on Cardizem LA 180 mg for hypertension.  4. Mixed hyperlipidemia Significant issues with lipids.  Last LDL was 209.  He has multiple statin intolerances.  Currently only on Zetia 10 mg daily.  Please refer to lipid clinic for evaluation.    5. Tobacco abuse  Long history of smoking for 20 years per patient's statement.  Highly advised cessation.  He states he is wearing patches to help him stop smoking.  I advised him if stress test and echocardiogram did not produce abnormal results he may need referral to pulmonology.  He verbalizes understanding.  Medication Adjustments/Labs and Tests Ordered: Current medicines are reviewed at length with the patient today.  Concerns regarding medicines are outlined above.   Disposition: Follow-up with Dr. Domenic Polite or APP 3 months  Signed, Levell July, NP 01/05/2021 8:37 AM    [facility] [place], [place], [place] [address] Phone: [phone]; Fax: [phone]

## 2021-01-05 ENCOUNTER — Telehealth: Payer: Self-pay | Admitting: Family Medicine

## 2021-01-05 ENCOUNTER — Encounter: Payer: Self-pay | Admitting: Family Medicine

## 2021-01-05 ENCOUNTER — Ambulatory Visit: Payer: BC Managed Care – PPO | Admitting: Family Medicine

## 2021-01-05 VITALS — BP 134/98 | HR 88 | Ht 72.0 in | Wt 222.0 lb

## 2021-01-05 DIAGNOSIS — R079 Chest pain, unspecified: Secondary | ICD-10-CM

## 2021-01-05 DIAGNOSIS — E782 Mixed hyperlipidemia: Secondary | ICD-10-CM | POA: Diagnosis not present

## 2021-01-05 DIAGNOSIS — I10 Essential (primary) hypertension: Secondary | ICD-10-CM | POA: Diagnosis not present

## 2021-01-05 DIAGNOSIS — Z720 Tobacco use: Secondary | ICD-10-CM

## 2021-01-05 DIAGNOSIS — R0602 Shortness of breath: Secondary | ICD-10-CM

## 2021-01-05 NOTE — Telephone Encounter (Signed)
Checking percert on the following patient for testing scheduled at [facility]    ECHO   01/24/2021

## 2021-01-05 NOTE — Patient Instructions (Addendum)
Medication Instructions:  Continue all current medications.  Labwork: none  Testing/Procedures: Your physician has requested that you have an echocardiogram. Echocardiography is a painless test that uses sound waves to create images of your heart. It provides your doctor with information about the size and shape of your heart and how well your heart's chambers and valves are working. This procedure takes approximately one hour. There are no restrictions for this procedure. Office will contact with results via phone or letter.     Follow-Up: 3 months   Any Other Special Instructions Will Be Listed Below (If Applicable). Referral to :  Lipid Clinic   If you need a refill on your cardiac medications before your next appointment, please call your pharmacy.

## 2021-01-11 ENCOUNTER — Ambulatory Visit: Payer: BC Managed Care – PPO | Admitting: Pharmacist

## 2021-01-11 ENCOUNTER — Encounter (HOSPITAL_COMMUNITY): Payer: BC Managed Care – PPO

## 2021-01-11 ENCOUNTER — Other Ambulatory Visit: Payer: Self-pay

## 2021-01-11 VITALS — BP 160/106 | HR 93 | Resp 17 | Ht 72.0 in | Wt 216.4 lb

## 2021-01-11 DIAGNOSIS — T466X5A Adverse effect of antihyperlipidemic and antiarteriosclerotic drugs, initial encounter: Secondary | ICD-10-CM

## 2021-01-11 DIAGNOSIS — E782 Mixed hyperlipidemia: Secondary | ICD-10-CM

## 2021-01-11 DIAGNOSIS — M791 Myalgia, unspecified site: Secondary | ICD-10-CM | POA: Diagnosis not present

## 2021-01-11 NOTE — Progress Notes (Signed)
Patient ID: Tyler Dean                 DOB: 17-Aug-1985                    MRN: [id number]     HPI: Tyler Dean is a 35 y.o. male patient referred to lipid clinic by Levell July and Dr Domenic Polite. PMH is significant for HTN, HLD, tobacco use, and statin intolerance.  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  Patient able to bring up lipid panel on phone  TC 274 HDL 33 Trigs 215 LDL 200 Results are dated 06/06/20.   Reports next health screening is next week and will have lipid panel drawn again.  Has tried and failed pravastatin and atorvastatin and both called severe muscle pain. Has ongoing incidents of chest pain.  Reports he does not drink alcohol but does smoke cigarettes daily.  Has a strong family history of CAD.  Currently only managed on Zetia '10mg'$  due to statin intolerances.  Is trying to work on diet changes and has been researching the [place].  Reports diet had typically been red meat and hamburgers.  Current Medications: Zetia '10mg'$  Intolerances: pravastatin, atorvastatin Risk Factors: family history, smoking LDL goal: <70 Labs: Lifetime ASCVD risk: 69%  Past Medical History:  Diagnosis Date   ADHD    Essential hypertension    Hyperlipidemia    Migraines     Current Outpatient Medications on File Prior to Visit  Medication Sig Dispense Refill   albuterol (VENTOLIN HFA) 108 (90 Base) MCG/ACT inhaler Inhale 1-2 puffs into the lungs as needed.     diltiazem (CARDIZEM LA) 180 MG 24 hr tablet Take 180 mg by mouth daily.     esomeprazole (NEXIUM) 40 MG capsule Take 40 mg by mouth daily at 12 noon. Started yesterday.     ezetimibe (ZETIA) 10 MG tablet Take 10 mg by mouth daily.     predniSONE (STERAPRED UNI-PAK 21 TAB) 5 MG (21) TBPK tablet Take by mouth.     No current facility-administered medications on file prior to visit.    No Known Allergies  Assessment/Plan:  1. Hyperlipidemia  - Patient LDL 200 which is above goal of <70 and patient has multiple risk factors including smoking and family history.  Since is not tolerant to statins, recommended patient start on PCSK9i therapy.  Using [place] [employer], educated patient on mechanism of action, storage, site selection, administration, and possible side effects.  Patient voiced understanding.  Will complete PA and activate copay card for patient.  Recommended patient send over updated lipid panel when it is drawn next week.  Will recheck lipid panel in 2-3 months.  Start Repatha/Praluent SQ Q 14 days Recheck lipid panel in 2-3 months  Karren Cobble, PharmD, BCACP, [place], [place] [id number] [place], [place] [address] Phone: [phone]; Fax: [phone] 01/14/2021 1:18 PM

## 2021-01-11 NOTE — Patient Instructions (Addendum)
It was nice meeting you today  We would like your LDL (bad cholesterol) to be less than 70  We will start a new medication called Repatha, which you will inject once every 2 weeks  We will complete the prior authorization for you and call you when it is approved  After starting, we will recheck your cholesterol in about 2-3 months  Please call with any questions  Karren Cobble, PharmD, BCACP, [place], [place]. [address], [place], [place] [address] Phone: [phone]; Fax: [phone] 01/11/2021 9:54 AM

## 2021-01-14 ENCOUNTER — Telehealth: Payer: Self-pay | Admitting: Pharmacist

## 2021-01-14 DIAGNOSIS — E782 Mixed hyperlipidemia: Secondary | ICD-10-CM

## 2021-01-14 DIAGNOSIS — T466X5A Adverse effect of antihyperlipidemic and antiarteriosclerotic drugs, initial encounter: Secondary | ICD-10-CM | POA: Insufficient documentation

## 2021-01-14 NOTE — Telephone Encounter (Signed)
Please complete prior authorization for:  Name of medication, dose, and frequency Repatha '140mg'$  SQ Q 14 days or Praluent 150 mg SQ Q 14 days  Lab Orders Requested? yes  Which labs? Lipid panel  Estimated date for labs to be scheduled 2-3 months  Does patient need activated copay card? yes

## 2021-01-15 NOTE — Telephone Encounter (Signed)
Tried to complete pa on cmm but they stated that they couldn't find matching pt so I then called [phone] to complete a pa by phone but they actually stated that they did receive it I just will not be able to see the determination on my cmm portal.   Once approved need to call and instruct the pt to call the repatha ready to finish getting copay card set up. Also inform the pt that they will need to complete fasting labs post 4th dose.  ATTEMPTED TO GET COPAY CARD BUT THEY STATED: We just need a little more information from you. Call RepathaReady at [phone]) to see if you qualify for the [place]. Contact us  LIPID PANEL ORDERED AND RELEASED

## 2021-01-16 MED ORDER — REPATHA SURECLICK 140 MG/ML ~~LOC~~ SOAJ: 140.0000 mg | SUBCUTANEOUS | 11 refills | Status: DC

## 2021-01-16 NOTE — Telephone Encounter (Signed)
Called and spoke w/pt who stated that they already had the copay card for repatha, I sent the rx since they were approved, and advised the pt to complete fasting lipid panel post 4th dose and they voiced understanding.

## 2021-01-16 NOTE — Addendum Note (Signed)
Addended by: Allean Found on: 01/16/2021 09:36 AM   Modules accepted: Orders

## 2021-01-24 ENCOUNTER — Other Ambulatory Visit: Payer: Self-pay

## 2021-01-24 ENCOUNTER — Ambulatory Visit (HOSPITAL_COMMUNITY)
Admission: RE | Admit: 2021-01-24 | Discharge: 2021-01-24 | Disposition: A | Discharge status: Home / Self Care | Payer: BC Managed Care – PPO | Source: Ambulatory Visit | Attending: Family Medicine | Admitting: Family Medicine

## 2021-01-24 DIAGNOSIS — R0602 Shortness of breath: Secondary | ICD-10-CM | POA: Diagnosis present

## 2021-01-24 LAB — ECHOCARDIOGRAM COMPLETE
Area-P 1/2: 3.31 cm2
S' Lateral: 2.8 cm

## 2021-01-24 NOTE — Progress Notes (Signed)
*  PRELIMINARY RESULTS* Echocardiogram 2D Echocardiogram has been performed.  Tyler Dean 01/24/2021, 9:27 AM

## 2021-02-22 ENCOUNTER — Telehealth: Payer: Self-pay | Admitting: Cardiology

## 2021-02-22 NOTE — Telephone Encounter (Signed)
  Patient is scheduled on 02/26/21 at [facility] for a NM MYO MULT SPECT W/WALL 1DAY

## 2021-02-22 NOTE — Telephone Encounter (Signed)
  Patient is scheduled on 02/26/21 at [facility] for a

## 2021-02-26 ENCOUNTER — Ambulatory Visit (HOSPITAL_COMMUNITY)
Admission: RE | Admit: 2021-02-26 | Discharge: 2021-02-26 | Disposition: A | Discharge status: Home / Self Care | Payer: BC Managed Care – PPO | Source: Ambulatory Visit | Attending: Cardiology | Admitting: Cardiology

## 2021-02-26 ENCOUNTER — Telehealth: Payer: Self-pay | Admitting: *Deleted

## 2021-02-26 ENCOUNTER — Encounter (HOSPITAL_COMMUNITY): Payer: Self-pay

## 2021-02-26 ENCOUNTER — Encounter (HOSPITAL_COMMUNITY): Payer: BC Managed Care – PPO

## 2021-02-26 ENCOUNTER — Other Ambulatory Visit: Payer: Self-pay

## 2021-02-26 DIAGNOSIS — R079 Chest pain, unspecified: Secondary | ICD-10-CM | POA: Insufficient documentation

## 2021-02-26 LAB — NM MYOCAR MULTI W/SPECT W/WALL MOTION / EF
LV dias vol: 105 mL (ref 62–150)
LV sys vol: 46 mL
Nuc Stress EF: 56 %
Peak HR: 121 {beats}/min
RATE: 0.3
Rest HR: 80 {beats}/min
Rest Nuclear Isotope Dose: 10.8 mCi
SDS: 6
SRS: 0
SSS: 6
ST Depression (mm): 0 mm
Stress Nuclear Isotope Dose: 30 mCi
TID: 1.16

## 2021-02-26 MED ORDER — REGADENOSON 0.4 MG/5ML IV SOLN
INTRAVENOUS | Status: AC
  Administered 2021-02-26: 0.4 mg via INTRAVENOUS
  Filled 2021-02-26: qty 5

## 2021-02-26 MED ORDER — SODIUM CHLORIDE FLUSH 0.9 % IV SOLN
INTRAVENOUS | Status: AC
  Administered 2021-02-26: 10 mL via INTRAVENOUS
  Filled 2021-02-26: qty 10

## 2021-02-26 MED ORDER — TECHNETIUM TC 99M TETROFOSMIN IV KIT
10.0000 | PACK | Freq: Once | INTRAVENOUS | Status: AC | PRN
  Administered 2021-02-26: 10.75 via INTRAVENOUS

## 2021-02-26 MED ORDER — TECHNETIUM TC 99M TETROFOSMIN IV KIT
30.0000 | PACK | Freq: Once | INTRAVENOUS | Status: AC | PRN
  Administered 2021-02-26: 30 via INTRAVENOUS

## 2021-02-26 NOTE — Telephone Encounter (Signed)
-----   Message from Satira Sark, MD sent at 02/26/2021  3:47 PM EDT ----- Chart reviewed.  Patient seen most recently by Mr. Leonides Sake NP, I reviewed the note with plan for stress testing as already scheduled.  Myoview was low risk without significant ischemia to suggest obstructive CAD and LVEF normal at 56%.

## 2021-02-26 NOTE — Telephone Encounter (Signed)
Patient informed. Copy sent to PCP °

## 2021-03-05 ENCOUNTER — Encounter: Payer: Self-pay | Admitting: Internal Medicine

## 2021-04-17 ENCOUNTER — Ambulatory Visit: Payer: BC Managed Care – PPO | Admitting: Internal Medicine

## 2021-04-17 NOTE — Progress Notes (Deleted)
Cardiology Office Note  Date: 04/17/2021   ID: Tyler, Dean January 08, 1986, MRN [id number]  PCP:  Lavella Lemons, PA  Cardiologist:  Rozann Lesches, MD Electrophysiologist:  None   No chief complaint on file.   History of Present Illness: Tyler Dean is a 35 y.o. male last seen in September by Mr. Leonides Sake NP.  Results from echocardiogram and Myoview done in September and October respectively are outlined below, reassuring.  Past Medical History:  Diagnosis Date   ADHD    Essential hypertension    Hyperlipidemia    Migraines     Past Surgical History:  Procedure Laterality Date   CYSTOSCOPY N/A 04/27/2013   Procedure: CYSTOSCOPY;  Surgeon: Marissa Nestle, MD;  Location: [facility];  Service: Urology;  Laterality: N/A;   DENTAL SURGERY  05/19/2019   EAR CYST EXCISION N/A 04/27/2013   Procedure: EXCISION OF SEBACEOUS CYST ON PENIS AND SCROTUM;  Surgeon: Marissa Nestle, MD;  Location: [facility];  Service: Urology;  Laterality: N/A;   PILONIDAL CYST EXCISION N/A 04/16/2013   Procedure: CYST EXCISION PILONIDAL SIMPLE;  Surgeon: Jamesetta So, MD;  Location: [facility];  Service: General;  Laterality: N/A;    Current Outpatient Medications  Medication Sig Dispense Refill   albuterol (VENTOLIN HFA) 108 (90 Base) MCG/ACT inhaler Inhale 1-2 puffs into the lungs as needed.     diltiazem (CARDIZEM LA) 180 MG 24 hr tablet Take 180 mg by mouth daily.     esomeprazole (NEXIUM) 40 MG capsule Take 40 mg by mouth daily at 12 noon. Started yesterday.     Evolocumab (REPATHA SURECLICK) 253 MG/ML SOAJ Inject 140 mg into the skin every 14 (fourteen) days. 2 mL 11   ezetimibe (ZETIA) 10 MG tablet Take 10 mg by mouth daily.     No current facility-administered medications for this visit.   Allergies:  Atorvastatin, Crestor [rosuvastatin], and Pravastatin   Social History: The patient  reports that he has been smoking cigarettes. He has a 10.00 pack-year smoking history. He has never  used smokeless tobacco. He reports that he does not drink alcohol and does not use drugs.   Family History: The patient's family history includes Cancer in his father; Heart disease in his maternal grandfather, maternal grandmother, and mother; Migraines in his brother, maternal grandmother, maternal uncle, maternal uncle, and mother.   ROS:  Please see the history of present illness. Otherwise, complete review of systems is positive for {NONE DEFAULTED:18576}.  All other systems are reviewed and negative.   Physical Exam: VS:  There were no vitals taken for this visit., BMI There is no height or weight on file to calculate BMI.  Wt Readings from Last 3 Encounters:  01/11/21 216 lb 6.4 oz (98.2 kg)  01/05/21 222 lb (100.7 kg)  12/28/20 225 lb (102.1 kg)    General: Patient appears comfortable at rest. HEENT: Conjunctiva and lids normal, oropharynx clear with moist mucosa. Neck: Supple, no elevated JVP or carotid bruits, no thyromegaly. Lungs: Clear to auscultation, nonlabored breathing at rest. Cardiac: Regular rate and rhythm, no S3 or significant systolic murmur, no pericardial rub. Abdomen: Soft, nontender, no hepatomegaly, bowel sounds present, no guarding or rebound. Extremities: No pitting edema, distal pulses 2+. Skin: Warm and dry. Musculoskeletal: No kyphosis. Neuropsychiatric: Alert and oriented x3, affect grossly appropriate.  ECG:  An ECG dated 12/28/2020 was personally reviewed today and demonstrated:  Sinus rhythm.  Recent Labwork: 12/28/2020: ALT 60; AST 29; BUN 13;  Creatinine, Ser 0.71; Hemoglobin 15.9; Platelets 297; Potassium 4.1; Sodium 134   Other Studies Reviewed Today:  Echocardiogram 01/24/2021:  1. Left ventricular ejection fraction, by estimation, is 55 to 60%. The  left ventricle has normal function. The left ventricle has no regional  wall motion abnormalities. Left ventricular diastolic parameters were  normal.   2. Right ventricular systolic function is  normal. The right ventricular  size is normal. Tricuspid regurgitation signal is inadequate for assessing  PA pressure.   3. The mitral valve is grossly normal. Trivial mitral valve  regurgitation.   4. The aortic valve is tricuspid. Aortic valve regurgitation is not  visualized.   5. Aortic dilatation noted. There is borderline dilatation of the aortic  root, measuring 39 mm.   6. The inferior vena cava is normal in size with greater than 50%  respiratory variability, suggesting right atrial pressure of 3 mmHg.   Lexiscan Myoview 02/26/2021:   The study is normal. The study is low risk.   No ST deviation was noted. The ECG was negative for ischemia.   LV perfusion is normal.   Left ventricular function is normal. Nuclear stress EF: 56 %.   Low risk study with no significant myocardial perfusion defects to indicate ischemia and normal LVEF of 56%.  Assessment and Plan:    Medication Adjustments/Labs and Tests Ordered: Current medicines are reviewed at length with the patient today.  Concerns regarding medicines are outlined above.   Tests Ordered: No orders of the defined types were placed in this encounter.   Medication Changes: No orders of the defined types were placed in this encounter.   Disposition:  Follow up {follow up:15908}  Signed, Satira Sark, MD, [facility] 04/17/2021 1:21 PM    [facility] [place], [place], [place] [address] Phone: [phone]; Fax: [phone]

## 2021-04-18 ENCOUNTER — Ambulatory Visit: Payer: BC Managed Care – PPO | Admitting: Cardiology

## 2021-07-26 ENCOUNTER — Ambulatory Visit: Payer: BC Managed Care – PPO | Admitting: Neurology

## 2021-07-26 ENCOUNTER — Encounter: Payer: Self-pay | Admitting: Neurology

## 2021-07-26 VITALS — BP 129/87 | HR 90 | Ht 72.0 in | Wt 217.0 lb

## 2021-07-26 DIAGNOSIS — H539 Unspecified visual disturbance: Secondary | ICD-10-CM

## 2021-07-26 DIAGNOSIS — R519 Headache, unspecified: Secondary | ICD-10-CM

## 2021-07-26 DIAGNOSIS — R5383 Other fatigue: Secondary | ICD-10-CM

## 2021-07-26 DIAGNOSIS — R4189 Other symptoms and signs involving cognitive functions and awareness: Secondary | ICD-10-CM

## 2021-07-26 DIAGNOSIS — R51 Headache with orthostatic component, not elsewhere classified: Secondary | ICD-10-CM

## 2021-07-26 MED ORDER — QULIPTA 60 MG PO TABS: 60.0000 mg | ORAL_TABLET | Freq: Every day | ORAL | 11 refills | Status: DC

## 2021-07-26 MED ORDER — RIZATRIPTAN BENZOATE 10 MG PO TBDP: ORAL_TABLET | ORAL | 11 refills | Status: DC

## 2021-07-26 NOTE — Progress Notes (Addendum)
?[facility] ? ? ? ?Provider:  Dr Jaynee Eagles ?Requesting Provider: Aggie Hacker PA ?Primary Care Provider:  Lavella Lemons, PA ? ?CC: "Brain Fog" ? ? ?History July 26, 2021: This is a patient we have seen in the past for migraines in 2021.  Past medical history hypertension, hyperlipidemia, ADHD and migraines.  He is here for a new chief complaint of "brain fog", I [facility], his blood pressure continues to be elevated, was changed to Norvasc and seems to have improved, feels like he has "brain fog for the last 6 months" and pressure on the eyes, vision feels cloudy although his VA OU 20/20 and OS 20/20 and OD 20/20.  Blood pressure in the home per patient is 160/110 in the office it was 136/74, described as chronic, gradual in onset and ongoing, I reviewed physical examination which showed normal general, ears nose throat, neck, respiratory, cardiovascular, chest, abdomen, neurologic, diagnosis was essential hypertension, mixed hyperlipidemia, overweight and "brain fog".  He appears to be a smoker.  Brain fog improved with changes to Norvasc.  When I saw him in the past in 2021 I did order an MRI of the brain and he never had that completed. ? ?He says he has brain fog, hard to concentrate, behind the eyes, no signs or symptoms of sleep apnea. He feels like it is related to his calcium channel blocker for his BP now that he has been mre adherent. He also started repatha. He wakes up refreshed. He still has migraines but is a totally different feeling. The migraines were treated with rizariptan, he never had the MRi brain completed. He is trying to change medications, then he may try coming off of the [place]. He still has headaches, wakes up with them and can be worse positionally with changes in vision. No other focal neurologic deficits, associated symptoms, inciting events or modifiable factors. ? ? ?TSH normal, FT4 1.35 04/2021. CBC/CMP 12/2018 unremarkable except  for eleavted ALT ? ?HPI 05/24/2019:  Tyler Dean is a 36 y.o. male here as requested by Aggie Hacker PA for migraines. I reviewed Aggie Hacker PA's notes. PMHx HTN, HLD, ADHD,migraines.   Patient was last seen March 31, 2019, multiple elevated BP readings at home and at nurses office typically in the 140/100 range although often has pulse over 100, he denied chest pain, shortness of breath, nausea, vomiting, headache, dizziness, vision changes, cholesterol was elevated and unable to take atorvastatin total 269 and LDL 209, he takes Adderall 30 mg twice daily and with marked improvement with working efficiency, also on Strattera, Lexapro, sumatriptan hand, he is on Lexapro for anxiety, for his blood pressure he was continued on metoprolol extended release 50 mg once daily, migraine headaches previously seen by Dr. Mart Piggs, increase in frequency over the last 6 months, taking lots of Goody's, they started a trial of Imitrex and referred him here. ? ?He has had migraines since a child, even smelling pizza as a child would trigger migraines. They are behind his eye, start in the temple area, light and sound and smell sensitivity, pulsating/pounding/throbbing,nausea, no vomiting but feels like it, he just wants to lay down still in the dark. At least 16 migraine days a month. He wakes up with them. He wakes up with headaches quite frequently, he wakes with dry mouth. He has uncontrolled HTN. He tried Imitrex. He did not like the imitrex. Ongoing like this all year long or longer maybe even all his life. Strong family  history. His vision gets blurry with the migraines, not exertional. No other focal neurologic deficits, associated symptoms, inciting events or modifiable factors. ? ?Meds tried: Diltiazem, metoprolol, escitalopram, topiramate, amitriptyline ? ?Reviewed labs, cbc normal, cmp 06/2018 AST 50 and ALT 120 (he is aware and following with pcp) ? ? ?Review of Systems: ?Patient complains of symptoms per HPI as  well as the following symptoms:HTN. Pertinent negatives and positives per HPI. All others negative. ? ? ?Social History  ? ?Socioeconomic History  ? Marital status: Single  ?  Spouse name: Not on file  ? Number of children: 1  ? Years of education: Not on file  ? Highest education level: Associate degree: academic program  ?Occupational History  ? Occupation: [employer]  ?Tobacco Use  ? Smoking status: Every Day  ?  Packs/day: 0.50  ?  Years: 10.00  ?  Pack years: 5.00  ?  Types: Cigarettes  ? Smokeless tobacco: Never  ?Vaping Use  ? Vaping Use: Some days  ?Substance and Sexual Activity  ? Alcohol use: Never  ? Drug use: Never  ? Sexual activity: Not on file  ?Other Topics Concern  ? Not on file  ?Social History Narrative  ? Lives at home with his child  ? Right handed  ? Caffeine: 2 cups coffee/day, 1-2 cups soda/day  ? ?Social Determinants of Health  ? ?Financial Resource Strain: Not on file  ?Food Insecurity: Not on file  ?Transportation Needs: Not on file  ?Physical Activity: Not on file  ?Stress: Not on file  ?Social Connections: Not on file  ?Intimate Partner Violence: Not on file  ? ? ?Family History  ?Problem Relation Age of Onset  ? Heart disease Mother   ? Migraines Mother   ? Cancer Father   ? Migraines Brother   ? Heart disease Maternal Grandmother   ? Migraines Maternal Grandmother   ? Heart disease Maternal Grandfather   ? Migraines Maternal Uncle   ? Migraines Maternal Uncle   ? Colon cancer Neg Hx   ?     grandmother with colon cancer  ? Colon polyps Neg Hx   ? ? ?Past Medical History:  ?Diagnosis Date  ? ADHD   ? Essential hypertension   ? Hyperlipidemia   ? Migraines   ? ? ?Patient Active Problem List  ? Diagnosis Date Noted  ? Myalgia due to statin 01/14/2021  ? Chronic migraine without aura without status migrainosus, not intractable 05/24/2019  ? Morning headache 05/24/2019  ? Elevated LFTs 08/18/2018  ? Diverticulitis of colon 07/26/2017  ? Acute maxillary sinusitis 03/24/2017  ? Benign essential  hypertension 07/21/2016  ? Abdominal pain, epigastric 07/08/2016  ? Mixed hyperlipidemia 07/08/2016  ? Attention deficit disorder with hyperactivity 04/04/2016  ? Generalized anxiety disorder 02/27/2016  ? Contact dermatitis and other eczema 06/13/2015  ? ? ?Past Surgical History:  ?Procedure Laterality Date  ? CYSTOSCOPY N/A 04/27/2013  ? Procedure: CYSTOSCOPY;  Surgeon: Marissa Nestle, MD;  Location: [facility];  Service: Urology;  Laterality: N/A;  ? DENTAL SURGERY  05/19/2019  ? EAR CYST EXCISION N/A 04/27/2013  ? Procedure: EXCISION OF SEBACEOUS CYST ON PENIS AND SCROTUM;  Surgeon: Marissa Nestle, MD;  Location: [facility];  Service: Urology;  Laterality: N/A;  ? PILONIDAL CYST EXCISION N/A 04/16/2013  ? Procedure: CYST EXCISION PILONIDAL SIMPLE;  Surgeon: Jamesetta So, MD;  Location: [facility];  Service: General;  Laterality: N/A;  ? ? ?Current Outpatient Medications  ?Medication Sig Dispense Refill  ?  albuterol (VENTOLIN HFA) 108 (90 Base) MCG/ACT inhaler Inhale 1-2 puffs into the lungs as needed.    ? amLODipine (NORVASC) 10 MG tablet Take 10 mg by mouth daily.    ? Atogepant (QULIPTA) 60 MG TABS Take 60 mg by mouth daily. 30 tablet 11  ? Evolocumab (REPATHA SURECLICK) 888 MG/ML SOAJ Inject 140 mg into the skin every 14 (fourteen) days. 2 mL 11  ? rizatriptan (MAXALT-MLT) 10 MG disintegrating tablet 1 tablet Orally Once a day for 1 day(s) 10 tablet 11  ? ?No current facility-administered medications for this visit.  ? ? ?Allergies as of 07/26/2021 - Review Complete 07/26/2021  ?Allergen Reaction Noted  ? Atorvastatin  01/11/2021  ? Crestor [rosuvastatin]  01/11/2021  ? Pravastatin  01/11/2021  ? ? ?Vitals: ?BP 129/87 (BP Location: Left Arm, Patient Position: Sitting, Cuff Size: Large)   Pulse 90   Ht 6' (1.829 m)   Wt 217 lb (98.4 kg)   BMI 29.43 kg/m?  ?Last Weight:  ?Wt Readings from Last 1 Encounters:  ?07/26/21 217 lb (98.4 kg)  ? ?Last Height:   ?Ht Readings from Last 1 Encounters:  ?07/26/21 6' (1.829  m)  ? ? ?Physical exam: ?Exam: ?Gen: NAD, conversant, well nourised, well groomed                     ?CV: RRR, no MRG. No Carotid Bruits. No peripheral edema, warm, nontender ?Eyes: Conjunctivae clear witho

## 2021-07-26 NOTE — Patient Instructions (Addendum)
Discussed treating migraines: can change BP medications to a beta-blocker like propranolol that may help with BP as well(side effects). OR I would suggest Quipta which a new medication.  ? ?- Discussed MRI brain, he will mychart to schedule if not improved with medication changes ? ?- He has untreated ADHD, used to take adderral, may consider reconsidering, [facility] ? ?-discussed medication overuse, do not use OTC meds more than 2x per week, stop the Goodys ? ?-maxalt(rizatriptan) acutely for migraines, continue.  ? ?Meds ordered this encounter  ?Medications  ? Atogepant (QULIPTA) 60 MG TABS  ?  Sig: Take 60 mg by mouth daily.  ?  Dispense:  30 tablet  ?  Refill:  11  ? rizatriptan (MAXALT-MLT) 10 MG disintegrating tablet  ?  Sig: 1 tablet Orally Once a day for 1 day(s)  ?  Dispense:  10 tablet  ?  Refill:  11  ? ? ? ?Atogepant tablets ?What is this medication? ?ATOGEPANT (a TOE je pant) is used to prevent migraine headaches. ?This medicine may be used for other purposes; ask your health care provider or pharmacist if you have questions. ?COMMON BRAND NAME(S): QULIPTA ?What should I tell my care team before I take this medication? ?They need to know if you have any of these conditions: ?kidney disease ?liver disease ?an unusual or allergic reaction to atogepant, other medicines, foods, dyes, or preservatives ?pregnant or trying to get pregnant ?breast-feeding ?How should I use this medication? ?Take this medicine by mouth with water. Take it as directed on the prescription label at the same time every day. You can take it with or without food. If it upsets your stomach, take it with food. Keep taking it unless your health care provider tells you to stop. ?Talk to your health care provider about the use of this medicine in children. Special care may be needed. ?Overdosage: If you think you have taken too much of this medicine contact a poison control center or emergency room at once. ?NOTE: This  medicine is only for you. Do not share this medicine with others. ?What if I miss a dose? ?If you miss a dose, take it as soon as you can. If it is almost time for your next dose, take only that dose. Do not take double or extra doses. ?What may interact with this medication? ?carbamazepine ?certain medicines for fungal infections like itraconazole, ketoconazole ?clarithromycin ?cyclosporine ?efavirenz ?etravirine ?phenytoin ?rifampin ?[place] ?This list may not describe all possible interactions. Give your health care provider a list of all the medicines, herbs, non-prescription drugs, or dietary supplements you use. Also tell them if you smoke, drink alcohol, or use illegal drugs. Some items may interact with your medicine. ?What should I watch for while using this medication? ?Visit your health care provider for regular checks on your progress. Tell your health care provider if your symptoms do not start to get better or if they get worse. ?What side effects may I notice from receiving this medication? ?Side effects that you should report to your doctor or health care provider as soon as possible: ?allergic reactions (skin rash, itching or hives; swelling of the face, lips, tongue) ?light-colored stool ?liver injury (dark yellow or brown urine; general ill feeling or flu-like symptoms; loss of appetite, right upper belly pain; unusually weak or tired, yellowing of the eyes or skin) ?Side effects that usually do not require medical attention (report these to your doctor or health care provider if they continue  or are bothersome): ?constipation ?lack or loss of appetite ?nausea ?unusually weak or tired ?weight loss ?This list may not describe all possible side effects. Call your doctor for medical advice about side effects. You may report side effects to FDA at 1-800-FDA-1088. ?Where should I keep my medication? ?Keep out of the reach of children and pets. ?Store at room temperature between 20 and 25  degrees C (68 and 77 degrees F). Get rid of any unused medicine after the expiration date. ?To get rid of medicines that are no longer needed or have expired: ?Take the medicine to a medicine take-back program. Check with your pharmacy or law enforcement to find a location. ?If you cannot return the medicine, check the label or package insert to see if the medicine should be thrown out in the garbage or flushed down the toilet. If you are not sure, ask your health care provider. If it is safe to put it in the trash, take the medicine out of the container. Mix the medicine with cat litter, dirt, coffee grounds, or other unwanted substance. Seal the mixture in a bag or container. Put it in the trash. ?NOTE: This sheet is a summary. It may not cover all possible information. If you have questions about this medicine, talk to your doctor, pharmacist, or health care provider. ?? 2022 Elsevier/Gold Standard (2020-01-31 00:00:00) ? ?

## 2021-07-27 ENCOUNTER — Encounter: Payer: Self-pay | Admitting: Neurology

## 2021-08-02 NOTE — Addendum Note (Signed)
Addended by: Sarina Ill B on: 08/02/2021 10:11 AM ? ? Modules accepted: Orders ? ?

## 2021-08-08 NOTE — Telephone Encounter (Signed)
spoke to the patient he states he will call back to schedule  ?[employer]BS Josem Kaufmann: [phone] (exp. 08/08/21 to 09/06/21 ?

## 2021-08-15 NOTE — Telephone Encounter (Signed)
LVM for pt to call back to schedule.

## 2021-08-29 ENCOUNTER — Ambulatory Visit: Payer: BC Managed Care – PPO | Admitting: Cardiology

## 2021-08-29 ENCOUNTER — Encounter: Payer: Self-pay | Admitting: Cardiology

## 2021-08-29 VITALS — BP 130/86 | HR 97 | Ht 72.0 in | Wt 218.0 lb

## 2021-08-29 DIAGNOSIS — E782 Mixed hyperlipidemia: Secondary | ICD-10-CM | POA: Diagnosis not present

## 2021-08-29 DIAGNOSIS — I10 Essential (primary) hypertension: Secondary | ICD-10-CM | POA: Diagnosis not present

## 2021-08-29 MED ORDER — EZETIMIBE 10 MG PO TABS: 10.0000 mg | ORAL_TABLET | Freq: Every day | ORAL | 3 refills | Status: DC

## 2021-08-29 NOTE — Progress Notes (Signed)
? ? ?Cardiology Office Note ? ?Date: 08/29/2021  ? ?ID: Tyler Dean, DOB 1986-04-04, MRN [id number] ? ?PCP:  Lavella Lemons, PA  ?Cardiologist:  Rozann Lesches, MD ?Electrophysiologist:  None  ? ?Chief Complaint  ?Patient presents with  ? Cardiac follow-up  ? ? ?History of Present Illness: ?Tyler Dean is a 36 y.o. male last seen in September 2022 by Mr. Leonides Sake NP.  He is here for a routine visit.  Reports no chest pain or unusual shortness of breath.  Has been busy with work. ? ?We went over his medications, he has tolerated Repatha well.  Also taking over-the-counter omega-3 supplements.  I reviewed his recent lipid panel, LDL has come down to 91 from 200.  Triglycerides remain elevated in the 300 range. ? ?I reviewed his echocardiogram and Myoview results from last year. ? ?Past Medical History:  ?Diagnosis Date  ? ADHD   ? Essential hypertension   ? Hyperlipidemia   ? Migraines   ? ? ?Past Surgical History:  ?Procedure Laterality Date  ? CYSTOSCOPY N/A 04/27/2013  ? Procedure: CYSTOSCOPY;  Surgeon: Marissa Nestle, MD;  Location: [facility];  Service: Urology;  Laterality: N/A;  ? DENTAL SURGERY  05/19/2019  ? EAR CYST EXCISION N/A 04/27/2013  ? Procedure: EXCISION OF SEBACEOUS CYST ON PENIS AND SCROTUM;  Surgeon: Marissa Nestle, MD;  Location: [facility];  Service: Urology;  Laterality: N/A;  ? PILONIDAL CYST EXCISION N/A 04/16/2013  ? Procedure: CYST EXCISION PILONIDAL SIMPLE;  Surgeon: Jamesetta So, MD;  Location: [facility];  Service: General;  Laterality: N/A;  ? ? ?Current Outpatient Medications  ?Medication Sig Dispense Refill  ? albuterol (VENTOLIN HFA) 108 (90 Base) MCG/ACT inhaler Inhale 1-2 puffs into the lungs as needed.    ? amLODipine (NORVASC) 10 MG tablet Take 10 mg by mouth daily.    ? Atogepant (QULIPTA) 60 MG TABS Take 60 mg by mouth daily. 30 tablet 11  ? Evolocumab (REPATHA SURECLICK) 762 MG/ML SOAJ Inject 140 mg into the skin every 14 (fourteen) days. 2 mL 11  ? ezetimibe (ZETIA) 10 MG  tablet Take 1 tablet (10 mg total) by mouth daily. 90 tablet 3  ? losartan (COZAAR) 100 MG tablet Take 100 mg by mouth daily.    ? rizatriptan (MAXALT-MLT) 10 MG disintegrating tablet 1 tablet Orally Once a day for 1 day(s) 10 tablet 11  ? ?No current facility-administered medications for this visit.  ? ?Allergies:  Atorvastatin, Crestor [rosuvastatin], and Pravastatin  ? ?ROS: No palpitations or syncope. ? ?Physical Exam: ?VS:  BP 130/86   Pulse 97   Ht 6' (1.829 m)   Wt 218 lb (98.9 kg)   SpO2 97%   BMI 29.57 kg/m? , BMI Body mass index is 29.57 kg/m?. ? ?Wt Readings from Last 3 Encounters:  ?08/29/21 218 lb (98.9 kg)  ?07/26/21 217 lb (98.4 kg)  ?01/11/21 216 lb 6.4 oz (98.2 kg)  ?  ?General: Patient appears comfortable at rest. ?HEENT: Conjunctiva and lids normal. ?Lungs: Clear to auscultation, nonlabored breathing at rest. ?Cardiac: Regular rate and rhythm, no S3 or significant systolic murmur. ? ?ECG:  An ECG dated 12/28/2020 was personally reviewed today and demonstrated:  Sinus rhythm. ? ?Recent Labwork: ?12/28/2020: ALT 60; AST 29; BUN 13; Creatinine, Ser 0.71; Hemoglobin 15.9; Platelets 297; Potassium 4.1; Sodium 134  ?November 2022: LDL 125 ?April 2023; Cholesterol 174, HDL 30, TG 321, LDL 91 ? ?Other Studies Reviewed Today: ? ?Echocardiogram 01/24/2021: ? 1.  Left ventricular ejection fraction, by estimation, is 55 to 60%. The  ?left ventricle has normal function. The left ventricle has no regional  ?wall motion abnormalities. Left ventricular diastolic parameters were  ?normal.  ? 2. Right ventricular systolic function is normal. The right ventricular  ?size is normal. Tricuspid regurgitation signal is inadequate for assessing  ?PA pressure.  ? 3. The mitral valve is grossly normal. Trivial mitral valve  ?regurgitation.  ? 4. The aortic valve is tricuspid. Aortic valve regurgitation is not  ?visualized.  ? 5. Aortic dilatation noted. There is borderline dilatation of the aortic  ?root, measuring 39 mm.   ? 6. The inferior vena cava is normal in size with greater than 50%  ?respiratory variability, suggesting right atrial pressure of 3 mmHg.  ? ?Lexiscan Myoview 02/26/2021: ?  The study is normal. The study is low risk. ?  No ST deviation was noted. The ECG was negative for ischemia. ?  LV perfusion is normal. ?  Left ventricular function is normal. Nuclear stress EF: 56 %. ?  ?Low risk study with no significant myocardial perfusion defects to indicate ischemia and normal LVEF of 56%. ? ?Assessment and Plan: ? ?1.  Severe, mixed hyperlipidemia.  He has a history of statin intolerance.  Doing well on Repatha, we will add back Zetia 10 mg daily as well.  I encouraged him to increase his omega-3 supplements to at least 2000 mg daily.  Continue to track over time. ? ?2.  Essential hypertension, on Cozaar and Norvasc.  He continues to follow with PCP.  Systolic is 785 today.  No changes were made. ? ?3.  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  Continue to track over time. ? ?Medication Adjustments/Labs and Tests Ordered: ?Current medicines are reviewed at length with the patient today.  Concerns regarding medicines are outlined above.  ? ?Tests Ordered: ?No orders of the defined types were placed in this encounter. ? ? ?Medication Changes: ?Meds ordered this encounter  ?Medications  ? ezetimibe (ZETIA) 10 MG tablet  ?  Sig: Take 1 tablet (10 mg total) by mouth daily.  ?  Dispense:  90 tablet  ?  Refill:  3  ?  08/29/21 New Start  ? ? ?Disposition:  Follow up  1 year. ? ?Signed, ?Satira Sark, MD, [facility] ?08/29/2021 1:53 PM    ?[place] [facility] ?[address], [place], [place] [address] ?Phone: [phone]; Fax: [phone]  ?

## 2021-08-29 NOTE — Patient Instructions (Addendum)
Medication Instructions:  ?Your physician has recommended you make the following change in your medication:  ?Start Zetia 10 mg once a day ?Continue all other medications as needed ? ?Labwork: ?none ? ?Testing/Procedures: ?none ? ?Follow-Up: ?Your physician recommends that you schedule a follow-up appointment in: 1 year ? ?Any Other Special Instructions Will Be Listed Below (If Applicable). ? ?You will receive a reminder call in about 10 months reminding you to schedule your appointment. If you don't receive this call, please contact our office. ? ?If you need a refill on your cardiac medications before your next appointment, please call your pharmacy. ?

## 2021-09-11 ENCOUNTER — Telehealth: Payer: Self-pay | Admitting: Neurology

## 2021-09-11 NOTE — Telephone Encounter (Signed)
Patient called today to schedule MRI. I redid auth 45 mins MRI brain w/wo contrast Dr. Ihor Dow Josem Kaufmann: [id number] exp. 09/11/21-10/10/21 ?Scheduled at [facility] 09/18/21 at 1pm ?

## 2021-09-18 ENCOUNTER — Ambulatory Visit: Payer: BC Managed Care – PPO

## 2021-09-18 DIAGNOSIS — R519 Headache, unspecified: Secondary | ICD-10-CM

## 2021-09-18 DIAGNOSIS — R5383 Other fatigue: Secondary | ICD-10-CM | POA: Diagnosis not present

## 2021-09-18 DIAGNOSIS — H539 Unspecified visual disturbance: Secondary | ICD-10-CM

## 2021-09-18 DIAGNOSIS — R4189 Other symptoms and signs involving cognitive functions and awareness: Secondary | ICD-10-CM

## 2021-09-18 DIAGNOSIS — R51 Headache with orthostatic component, not elsewhere classified: Secondary | ICD-10-CM

## 2021-09-18 MED ORDER — GADOBENATE DIMEGLUMINE 529 MG/ML IV SOLN
20.0000 mL | Freq: Once | INTRAVENOUS | Status: AC | PRN
  Administered 2021-09-18: 20 mL via INTRAVENOUS

## 2021-12-01 ENCOUNTER — Other Ambulatory Visit: Payer: Self-pay | Admitting: Cardiology

## 2021-12-27 ENCOUNTER — Ambulatory Visit
Admission: EM | Admit: 2021-12-27 | Discharge: 2021-12-27 | Disposition: A | Discharge status: Home / Self Care | Payer: BC Managed Care – PPO | Attending: Family Medicine | Admitting: Family Medicine

## 2021-12-27 DIAGNOSIS — M79602 Pain in left arm: Secondary | ICD-10-CM | POA: Diagnosis not present

## 2021-12-27 DIAGNOSIS — R030 Elevated blood-pressure reading, without diagnosis of hypertension: Secondary | ICD-10-CM | POA: Diagnosis not present

## 2021-12-27 MED ORDER — CYCLOBENZAPRINE HCL 10 MG PO TABS: 10.0000 mg | ORAL_TABLET | Freq: Three times a day (TID) | ORAL | 0 refills | Status: DC | PRN

## 2021-12-27 MED ORDER — PREDNISONE 20 MG PO TABS: 40.0000 mg | ORAL_TABLET | Freq: Every day | ORAL | 0 refills | Status: DC

## 2021-12-27 NOTE — ED Provider Notes (Signed)
[facility]    CSN: [id number] Arrival date & time: 12/27/21  0847      History   Chief Complaint Chief Complaint  Patient presents with   Arm Pain    HPI Tyler Dean is a 36 y.o. male.   Patient presenting today with severe left elbow region pain, stiffness that has progressively worsened over the past 3 days.  States he recently started lifting weights again and is unsure if he pulled a muscle.  Denies swelling, discoloration or known direct injury to the area.  Is having some numbness and tingling down into the hand at this point and pain is significantly worse with movement.  So far trying some leftover Zanaflex that he had at home with minimal relief.  He is very concerned about rhabdomyolysis at this time as he read that the medications he is on cause an increased propensity.  He denies any dark urine, generalized body aches, confusion, fever, sweats.    Past Medical History:  Diagnosis Date   ADHD    Essential hypertension    Hyperlipidemia    Migraines     Patient Active Problem List   Diagnosis Date Noted   Myalgia due to statin 01/14/2021   Chronic migraine without aura without status migrainosus, not intractable 05/24/2019   Morning headache 05/24/2019   Elevated LFTs 08/18/2018   Diverticulitis of colon 07/26/2017   Acute maxillary sinusitis 03/24/2017   Benign essential hypertension 07/21/2016   Abdominal pain, epigastric 07/08/2016   Mixed hyperlipidemia 07/08/2016   Attention deficit disorder with hyperactivity 04/04/2016   Generalized anxiety disorder 02/27/2016   Contact dermatitis and other eczema 06/13/2015    Past Surgical History:  Procedure Laterality Date   CYSTOSCOPY N/A 04/27/2013   Procedure: CYSTOSCOPY;  Surgeon: Marissa Nestle, MD;  Location: [facility];  Service: Urology;  Laterality: N/A;   DENTAL SURGERY  05/19/2019   EAR CYST EXCISION N/A 04/27/2013   Procedure: EXCISION OF SEBACEOUS CYST ON PENIS AND SCROTUM;   Surgeon: Marissa Nestle, MD;  Location: [facility];  Service: Urology;  Laterality: N/A;   PILONIDAL CYST EXCISION N/A 04/16/2013   Procedure: CYST EXCISION PILONIDAL SIMPLE;  Surgeon: Jamesetta So, MD;  Location: [facility];  Service: General;  Laterality: N/A;       Home Medications    Prior to Admission medications   Medication Sig Start Date End Date Taking? Authorizing Provider  cyclobenzaprine (FLEXERIL) 10 MG tablet Take 1 tablet (10 mg total) by mouth 3 (three) times daily as needed for muscle spasms. Do not drink alcohol or drive while taking these medications.  May cause drowsiness. 12/27/21  Yes Volney American, PA-C  predniSONE (DELTASONE) 20 MG tablet Take 2 tablets (40 mg total) by mouth daily with breakfast. 12/27/21  Yes Volney American, PA-C  albuterol (VENTOLIN HFA) 108 (90 Base) MCG/ACT inhaler Inhale 1-2 puffs into the lungs as needed. 01/03/21   [provider]  amLODipine (NORVASC) 10 MG tablet Take 10 mg by mouth daily. 07/12/21   [provider]  Atogepant (QULIPTA) 60 MG TABS Take 60 mg by mouth daily. 07/26/21   Melvenia Beam, MD  ezetimibe (ZETIA) 10 MG tablet Take 1 tablet (10 mg total) by mouth daily. 08/29/21 08/24/22  Satira Sark, MD  losartan (COZAAR) 100 MG tablet Take 100 mg by mouth daily. 08/22/21   [provider]  REPATHA SURECLICK 983 MG/ML SOAJ INJECT 140 MG  SUBCUTANEOUSLY EVERY 14 DAYS 12/03/21  Satira Sark, MD  rizatriptan (MAXALT-MLT) 10 MG disintegrating tablet 1 tablet Orally Once a day for 1 day(s) 07/26/21   Melvenia Beam, MD    Family History Family History  Problem Relation Age of Onset   Heart disease Mother    Migraines Mother    Cancer Father    Migraines Brother    Heart disease Maternal Grandmother    Migraines Maternal Grandmother    Heart disease Maternal Grandfather    Migraines Maternal Uncle    Migraines Maternal Uncle    Colon cancer Neg Hx        grandmother with colon  cancer   Colon polyps Neg Hx     Social History Social History   Tobacco Use   Smoking status: Every Day    Packs/day: 0.50    Years: 10.00    Total pack years: 5.00    Types: Cigarettes   Smokeless tobacco: Never  Vaping Use   Vaping Use: Some days  Substance Use Topics   Alcohol use: Never   Drug use: Never     Allergies   Atorvastatin, Crestor [rosuvastatin], and Pravastatin   Review of Systems Review of Systems Per HPI  Physical Exam Triage Vital Signs ED Triage Vitals [12/27/21 0933]  Enc Vitals Group     BP (!) 151/114     Pulse Rate 90     Resp 18     Temp 98.5 F (36.9 C)     Temp Source Oral     SpO2 96 %     Weight      Height      Head Circumference      Peak Flow      Pain Score 8     Pain Loc      Pain Edu?      Excl. in [place]?    No data found.  Updated Vital Signs BP (!) 151/114 (BP Location: Right Arm)   Pulse 90   Temp 98.5 F (36.9 C) (Oral)   Resp 18   SpO2 96%   Visual Acuity Right Eye Distance:   Left Eye Distance:   Bilateral Distance:    Right Eye Near:   Left Eye Near:    Bilateral Near:     Physical Exam Vitals and nursing note reviewed.  Constitutional:      Appearance: Normal appearance.  HENT:     Head: Atraumatic.  Eyes:     Extraocular Movements: Extraocular movements intact.     Conjunctiva/sclera: Conjunctivae normal.  Cardiovascular:     Rate and Rhythm: Normal rate and regular rhythm.  Pulmonary:     Effort: Pulmonary effort is normal.     Breath sounds: Normal breath sounds.  Musculoskeletal:        General: Tenderness present. No swelling. Normal range of motion.     Cervical back: Normal range of motion and neck supple.     Comments: Range of motion of the left arm intact but painful with extension.  Tenderness to palpation above and below the elbow  Skin:    General: Skin is warm and dry.     Findings: No erythema.  Neurological:     General: No focal deficit present.     Mental Status: He  is oriented to person, place, and time.     Comments: Left arm neurovascularly intact  Psychiatric:        Mood and Affect: Mood normal.  Thought Content: Thought content normal.        Judgment: Judgment normal.      UC Treatments / Results  Labs (all labs ordered are listed, but only abnormal results are displayed) Labs Reviewed  COMPREHENSIVE METABOLIC PANEL  CK    EKG   Radiology No results found.  Procedures Procedures (including critical care time)  Medications Ordered in UC Medications - No data to display  Initial Impression / Assessment and Plan / UC Course  I have reviewed the triage vital signs and the nursing notes.  Pertinent labs & imaging results that were available during my care of the patient were reviewed by me and considered in my medical decision making (see chart for details).     Very low suspicion for rhabdomyolysis, suspect tendinitis type injury but for reassurance we will obtain a CMP and CK level and discussed good hydration, close monitoring.  Also reviewed elevated blood pressure reading, he states he has been compliant with his blood pressure medications, suspect the elevation is related to pain and discussed DASH diet, close home monitoring and follow-up with PCP.  He denies any chest pain, shortness of breath, headaches, dizziness at this time.  Final Clinical Impressions(s) / UC Diagnoses   Final diagnoses:  Left arm pain  Elevated blood pressure reading   Discharge Instructions   None    ED Prescriptions     Medication Sig Dispense Auth. Provider   cyclobenzaprine (FLEXERIL) 10 MG tablet Take 1 tablet (10 mg total) by mouth 3 (three) times daily as needed for muscle spasms. Do not drink alcohol or drive while taking these medications.  May cause drowsiness. 15 tablet Volney American, [place]   predniSONE (DELTASONE) 20 MG tablet Take 2 tablets (40 mg total) by mouth daily with breakfast. 10 tablet Volney American, [place]      PDMP not reviewed this encounter.   Merrie Roof [place], [place] 12/27/21 [phone]

## 2021-12-27 NOTE — ED Triage Notes (Signed)
Pt present left arm/elbow pain. Pt recently worked out and possible pulled a muscle. Symptoms started three days ago and gradual has gotten worst

## 2021-12-28 ENCOUNTER — Inpatient Hospital Stay (HOSPITAL_COMMUNITY)
Admission: EM | Admit: 2021-12-28 | Discharge: 2021-12-30 | DRG: 558 | Disposition: A | Discharge status: Home / Self Care | Payer: BC Managed Care – PPO | Attending: Internal Medicine | Admitting: Internal Medicine

## 2021-12-28 ENCOUNTER — Other Ambulatory Visit: Payer: Self-pay

## 2021-12-28 ENCOUNTER — Encounter (HOSPITAL_COMMUNITY): Payer: Self-pay | Admitting: Emergency Medicine

## 2021-12-28 ENCOUNTER — Telehealth: Payer: Self-pay | Admitting: Emergency Medicine

## 2021-12-28 DIAGNOSIS — Z8249 Family history of ischemic heart disease and other diseases of the circulatory system: Secondary | ICD-10-CM

## 2021-12-28 DIAGNOSIS — Z79899 Other long term (current) drug therapy: Secondary | ICD-10-CM | POA: Diagnosis not present

## 2021-12-28 DIAGNOSIS — R7401 Elevation of levels of liver transaminase levels: Secondary | ICD-10-CM | POA: Diagnosis present

## 2021-12-28 DIAGNOSIS — Z800 Family history of malignant neoplasm of digestive organs: Secondary | ICD-10-CM

## 2021-12-28 DIAGNOSIS — E782 Mixed hyperlipidemia: Secondary | ICD-10-CM | POA: Diagnosis not present

## 2021-12-28 DIAGNOSIS — Z888 Allergy status to other drugs, medicaments and biological substances status: Secondary | ICD-10-CM

## 2021-12-28 DIAGNOSIS — F909 Attention-deficit hyperactivity disorder, unspecified type: Secondary | ICD-10-CM | POA: Diagnosis not present

## 2021-12-28 DIAGNOSIS — F17210 Nicotine dependence, cigarettes, uncomplicated: Secondary | ICD-10-CM | POA: Diagnosis present

## 2021-12-28 DIAGNOSIS — I10 Essential (primary) hypertension: Secondary | ICD-10-CM | POA: Diagnosis not present

## 2021-12-28 DIAGNOSIS — M6282 Rhabdomyolysis: Principal | ICD-10-CM | POA: Diagnosis present

## 2021-12-28 DIAGNOSIS — R748 Abnormal levels of other serum enzymes: Secondary | ICD-10-CM | POA: Diagnosis present

## 2021-12-28 DIAGNOSIS — Z7952 Long term (current) use of systemic steroids: Secondary | ICD-10-CM

## 2021-12-28 LAB — URINALYSIS, ROUTINE W REFLEX MICROSCOPIC
Bacteria, UA: NONE SEEN
Bilirubin Urine: NEGATIVE
Glucose, UA: NEGATIVE mg/dL
Ketones, ur: NEGATIVE mg/dL
Leukocytes,Ua: NEGATIVE
Nitrite: NEGATIVE
Protein, ur: NEGATIVE mg/dL
Specific Gravity, Urine: 1.01 (ref 1.005–1.030)
pH: 7 (ref 5.0–8.0)

## 2021-12-28 LAB — CBC WITH DIFFERENTIAL/PLATELET
Abs Immature Granulocytes: 0.04 10*3/uL (ref 0.00–0.07)
Basophils Absolute: 0 10*3/uL (ref 0.0–0.1)
Basophils Relative: 0 %
Eosinophils Absolute: 0.1 10*3/uL (ref 0.0–0.5)
Eosinophils Relative: 1 %
HCT: 44.7 % (ref 39.0–52.0)
Hemoglobin: 15.3 g/dL (ref 13.0–17.0)
Immature Granulocytes: 0 %
Lymphocytes Relative: 16 %
Lymphs Abs: 1.7 10*3/uL (ref 0.7–4.0)
MCH: 31.2 pg (ref 26.0–34.0)
MCHC: 34.2 g/dL (ref 30.0–36.0)
MCV: 91 fL (ref 80.0–100.0)
Monocytes Absolute: 0.4 10*3/uL (ref 0.1–1.0)
Monocytes Relative: 4 %
Neutro Abs: 8.3 10*3/uL — ABNORMAL HIGH (ref 1.7–7.7)
Neutrophils Relative %: 79 %
Platelets: 327 10*3/uL (ref 150–400)
RBC: 4.91 MIL/uL (ref 4.22–5.81)
RDW: 12.3 % (ref 11.5–15.5)
WBC: 10.5 10*3/uL (ref 4.0–10.5)
nRBC: 0 % (ref 0.0–0.2)

## 2021-12-28 LAB — COMPREHENSIVE METABOLIC PANEL
ALT: 112 IU/L — ABNORMAL HIGH (ref 0–44)
ALT: 132 U/L — ABNORMAL HIGH (ref 0–44)
AST: 210 IU/L — ABNORMAL HIGH (ref 0–40)
AST: 232 U/L — ABNORMAL HIGH (ref 15–41)
Albumin/Globulin Ratio: 2.2 (ref 1.2–2.2)
Albumin: 4.8 g/dL (ref 4.1–5.1)
Albumin: 4.6 g/dL (ref 3.5–5.0)
Alkaline Phosphatase: 75 IU/L (ref 44–121)
Alkaline Phosphatase: 66 U/L (ref 38–126)
Anion gap: 8 (ref 5–15)
BUN/Creatinine Ratio: 15 (ref 9–20)
BUN: 12 mg/dL (ref 6–20)
BUN: 16 mg/dL (ref 6–20)
Bilirubin Total: 0.2 mg/dL (ref 0.0–1.2)
CO2: 24 mmol/L (ref 20–29)
CO2: 26 mmol/L (ref 22–32)
Calcium: 9.3 mg/dL (ref 8.7–10.2)
Calcium: 9.8 mg/dL (ref 8.9–10.3)
Chloride: 102 mmol/L (ref 96–106)
Chloride: 104 mmol/L (ref 98–111)
Creatinine, Ser: 0.81 mg/dL (ref 0.76–1.27)
Creatinine, Ser: 0.84 mg/dL (ref 0.61–1.24)
GFR, Estimated: >60 mL/min (ref >60)
Globulin, Total: 2.2 g/dL (ref 1.5–4.5)
Glucose, Bld: 107 mg/dL — ABNORMAL HIGH (ref 70–99)
Glucose: 97 mg/dL (ref 70–99)
Potassium: 4.5 mmol/L (ref 3.5–5.2)
Potassium: 4.2 mmol/L (ref 3.5–5.1)
Sodium: 141 mmol/L (ref 134–144)
Sodium: 138 mmol/L (ref 135–145)
Total Bilirubin: 0.5 mg/dL (ref 0.3–1.2)
Total Protein: 7 g/dL (ref 6.0–8.5)
Total Protein: 7.7 g/dL (ref 6.5–8.1)
eGFR: 118 mL/min/{1.73_m2} (ref >59)

## 2021-12-28 LAB — RAPID URINE DRUG SCREEN, HOSP PERFORMED
Amphetamines: NOT DETECTED
Barbiturates: NOT DETECTED
Benzodiazepines: NOT DETECTED
Cocaine: NOT DETECTED
Opiates: NOT DETECTED
Tetrahydrocannabinol: POSITIVE — AB

## 2021-12-28 LAB — CK
Total CK: 18748 U/L (ref 49–439)
Total CK: 17199 U/L — ABNORMAL HIGH (ref 49–397)

## 2021-12-28 MED ORDER — HYDRALAZINE HCL 20 MG/ML IJ SOLN: 5.0000 mg | Freq: Four times a day (QID) | INTRAMUSCULAR | Status: DC | PRN

## 2021-12-28 MED ORDER — ACETAMINOPHEN 650 MG RE SUPP: 650.0000 mg | Freq: Four times a day (QID) | RECTAL | Status: DC | PRN

## 2021-12-28 MED ORDER — HYDRALAZINE HCL 20 MG/ML IJ SOLN
2.0000 mg | Freq: Once | INTRAMUSCULAR | Status: AC
  Administered 2021-12-28: 2 mg via INTRAVENOUS
  Filled 2021-12-28: qty 1

## 2021-12-28 MED ORDER — SODIUM CHLORIDE 0.9 % IV BOLUS
1000.0000 mL | Freq: Once | INTRAVENOUS | Status: AC
  Administered 2021-12-28: 1000 mL via INTRAVENOUS

## 2021-12-28 MED ORDER — ENOXAPARIN SODIUM 40 MG/0.4ML IJ SOSY
40.0000 mg | PREFILLED_SYRINGE | INTRAMUSCULAR | Status: DC
  Administered 2021-12-29: 40 mg via SUBCUTANEOUS
  Filled 2021-12-28: qty 0.4

## 2021-12-28 MED ORDER — KETOROLAC TROMETHAMINE 30 MG/ML IJ SOLN
30.0000 mg | Freq: Once | INTRAMUSCULAR | Status: AC
  Administered 2021-12-28: 30 mg via INTRAVENOUS
  Filled 2021-12-28: qty 1

## 2021-12-28 MED ORDER — MORPHINE SULFATE (PF) 2 MG/ML IV SOLN
2.0000 mg | INTRAVENOUS | Status: DC | PRN
  Administered 2021-12-28 – 2021-12-29 (×4): 2 mg via INTRAVENOUS
  Filled 2021-12-28 (×4): qty 1

## 2021-12-28 MED ORDER — SODIUM CHLORIDE 0.9 % IV SOLN: INTRAVENOUS | Status: DC

## 2021-12-28 MED ORDER — LOSARTAN POTASSIUM 50 MG PO TABS
100.0000 mg | ORAL_TABLET | Freq: Every day | ORAL | Status: DC
  Administered 2021-12-29 – 2021-12-30 (×2): 100 mg via ORAL
  Filled 2021-12-28 (×3): qty 2

## 2021-12-28 MED ORDER — HYDROMORPHONE HCL 1 MG/ML IJ SOLN
0.5000 mg | Freq: Once | INTRAMUSCULAR | Status: AC
  Administered 2021-12-28: 0.5 mg via INTRAVENOUS
  Filled 2021-12-28: qty 0.5

## 2021-12-28 MED ORDER — ACETAMINOPHEN 325 MG PO TABS: 650.0000 mg | ORAL_TABLET | Freq: Four times a day (QID) | ORAL | Status: DC | PRN

## 2021-12-28 NOTE — ED Notes (Addendum)
Date and time results received: 12/28/21 824  Test: CK Critical Value: 18,748  Name of Provider Notified: Jesse Sans Leath,NP  Orders Received? Or Actions Taken?: Actions Taken: Called Pt and notified of abnormal labs and the need to follow-up with local ED. Pt verbalized understanding.

## 2021-12-28 NOTE — H&P (Signed)
[facility] H&P   Patient Demographics:    Tyler Dean, is a 36 y.o. male  MRN: [id number]   DOB - [birth year]  Admit Date - 12/28/2021  Outpatient Primary MD for the patient is Lavella Lemons, PA  Referring MD/NP/PA: [place]  Outpatient Specialists: cardiology DR [facility]    Patient coming from: home  Chief Complaint  Patient presents with   Abnormal Labs      HPI:    Tyler Dean  is a 36 y.o. male, with past medical history of hypertension, hyperlipidemia, following with Dr. Domenic Polite, he is on Repatha(supposed to be on Zetia but he stopped taking it 2 months ago), patient presents to ED secondary to abnormal labs from urgent care, patient was seen by urgent care yesterday, due to arm pain bilaterally after he had intensive workout on Monday(this is his first work out session and longtime, and patient thinks he overdid it), he was diagnosed with tendinitis at urgent care and given Flexeril and prednisone, pain proved today, noted body ache in both arms, legs, left flank pain, was called by urgent care that his total CK is elevated and instructed to come to ED for further evaluation, he denies any chest pain, cramping or shortness of breath. -In ED vital signs are stable, but his work-up significant for elevated AST, ALT 112> 132, ranging up from yesterday labs at urgent care 210> 232 and his total CK is elevated at 17,000, but trending down from 18,748 yesterday,.  Hospitalist consulted to admit.   Review of systems:    A full 10 point Review of Systems was done, except as stated above, all other Review of Systems were negative.   With Past History of the following :    Past Medical History:  Diagnosis Date   ADHD    Essential hypertension    Hyperlipidemia    Migraines       Past Surgical History:  Procedure Laterality Date   CYSTOSCOPY N/A 04/27/2013   Procedure:  CYSTOSCOPY;  Surgeon: Marissa Nestle, MD;  Location: [facility];  Service: Urology;  Laterality: N/A;   DENTAL SURGERY  05/19/2019   EAR CYST EXCISION N/A 04/27/2013   Procedure: EXCISION OF SEBACEOUS CYST ON PENIS AND SCROTUM;  Surgeon: Marissa Nestle, MD;  Location: [facility];  Service: Urology;  Laterality: N/A;   PILONIDAL CYST EXCISION N/A 04/16/2013   Procedure: CYST EXCISION PILONIDAL SIMPLE;  Surgeon: Jamesetta So, MD;  Location: [facility];  Service: General;  Laterality: N/A;      Social History:     Social History   Tobacco Use   Smoking status: Every Day    Packs/day: 0.50    Years: 10.00    Total pack years: 5.00    Types: Cigarettes   Smokeless tobacco: Never  Substance Use Topics   Alcohol use: Never       Family  History :     Family History  Problem Relation Age of Onset   Heart disease Mother    Migraines Mother    Cancer Father    Migraines Brother    Heart disease Maternal Grandmother    Migraines Maternal Grandmother    Heart disease Maternal Grandfather    Migraines Maternal Uncle    Migraines Maternal Uncle    Colon cancer Neg Hx        grandmother with colon cancer   Colon polyps Neg Hx      Home Medications:   Prior to Admission medications   Medication Sig Start Date End Date Taking? Authorizing Provider  buPROPion (WELLBUTRIN XL) 300 MG 24 hr tablet Take 300 mg by mouth daily. 12/24/21  Yes [provider]  losartan (COZAAR) 100 MG tablet Take 100 mg by mouth daily. 08/22/21  Yes [provider]  predniSONE (DELTASONE) 20 MG tablet Take 2 tablets (40 mg total) by mouth daily with breakfast. 12/27/21  Yes Volney American, PA-C  REPATHA SURECLICK 409 MG/ML SOAJ INJECT 140 MG  SUBCUTANEOUSLY EVERY 14 DAYS Patient taking differently: Inject 140 mg into the skin every 14 (fourteen) days. 12/03/21  Yes Satira Sark, MD  cyclobenzaprine (FLEXERIL) 10 MG tablet Take 1 tablet (10 mg total) by mouth 3 (three) times daily as  needed for muscle spasms. Do not drink alcohol or drive while taking these medications.  May cause drowsiness. 12/27/21   Volney American, PA-C     Allergies:     Allergies  Allergen Reactions   Atorvastatin     myalgias   Crestor [Rosuvastatin]     Myalgia   Pravastatin     myalgias     Physical Exam:   Vitals  Blood pressure 131/83, pulse (!) 108, temperature 98.2 F (36.8 C), temperature source Oral, resp. rate 20, height 6' (1.829 m), weight 102.1 kg, SpO2 97 %.   1. General developed male, laying in bed, no apparent distress  2. Normal affect and insight, Not Suicidal or Homicidal, Awake Alert, Oriented X 3.  3. No F.N deficits, ALL C.Nerves Intact, Strength 5/5 all 4 extremities, Sensation intact all 4 extremities, Plantars down going.  4. Ears and Eyes appear Normal, Conjunctivae clear, PERRLA. Moist Oral Mucosa.  5. Supple Neck, No JVD, No cervical lymphadenopathy appriciated, No Carotid Bruits.  6. Symmetrical Chest wall movement, Good air movement bilaterally, CTAB.  7. RRR, No Gallops, Rubs or Murmurs, No Parasternal Heave.  8. Positive Bowel Sounds, Abdomen Soft, No tenderness, No organomegaly appriciated,No rebound -guarding or rigidity.  9.  No Cyanosis, Normal Skin Turgor, No Skin Rash or Bruise.  10. Good muscle tone,  joints appear normal , no effusions, Normal ROM.    Data Review:    CBC Recent Labs  Lab 12/28/21 1146  WBC 10.5  HGB 15.3  HCT 44.7  PLT 327  MCV 91.0  MCH 31.2  MCHC 34.2  RDW 12.3  LYMPHSABS 1.7  MONOABS 0.4  EOSABS 0.1  BASOSABS 0.0   ------------------------------------------------------------------------------------------------------------------  Chemistries  Recent Labs  Lab 12/27/21 1007 12/28/21 1146  NA 141 138  K 4.5 4.2  CL 102 104  CO2 24 26  GLUCOSE 97 107*  BUN 12 16  CREATININE 0.81 0.84  CALCIUM 9.3 9.8  AST 210* 232*  ALT 112* 132*  ALKPHOS 75 66  BILITOT 0.2 0.5    ------------------------------------------------------------------------------------------------------------------ estimated creatinine clearance is 151.7 mL/min (by C-G formula based on SCr of  0.84 mg/dL). ------------------------------------------------------------------------------------------------------------------ No results for input(s): "TSH", "T4TOTAL", "T3FREE", "THYROIDAB" in the last 72 hours.  Invalid input(s): "FREET3"  Coagulation profile No results for input(s): "INR", "PROTIME" in the last 168 hours. ------------------------------------------------------------------------------------------------------------------- No results for input(s): "DDIMER" in the last 72 hours. -------------------------------------------------------------------------------------------------------------------  Cardiac Enzymes No results for input(s): "CKMB", "TROPONINI", "MYOGLOBIN" in the last 168 hours.  Invalid input(s): "CK" ------------------------------------------------------------------------------------------------------------------ No results found for: "BNP"   ---------------------------------------------------------------------------------------------------------------  Urinalysis    Component Value Date/Time   COLORURINE YELLOW 12/28/2021 [place] 12/28/2021 1150   LABSPEC 1.010 12/28/2021 1150   PHURINE 7.0 12/28/2021 1150   GLUCOSEU NEGATIVE 12/28/2021 1150   HGBUR SMALL (A) 12/28/2021 [place] 12/28/2021 1150   KETONESUR NEGATIVE 12/28/2021 1150   PROTEINUR NEGATIVE 12/28/2021 1150   NITRITE NEGATIVE 12/28/2021 [place] 12/28/2021 1150    ----------------------------------------------------------------------------------------------------------------   Imaging Results:    No results found.    Assessment & Plan:    Principal Problem:   Rhabdomyolysis Active Problems:   Essential hypertension   Mixed  hyperlipidemia  Muscle aches/rhabdomyolysis -Patient presents with generalized muscle ache after extensive workout, his total CK is significantly elevated. -Continue with normal limit. -Is not taking Zetia anymore, reviewing Repatha, it does cause myalgias, but does not cause rhabdomyolysis -Most likely related to his intense workout,. -Monitor CK daily. -Avoid nephrotoxic medications. -will give aggressive IV hydration.  Transaminitis -Patient denies any alcohol or illegal substance use -Patient reports he has been using prework-up supplements on [place], which is high in caffeine and green tea extract(green tea extract and high concentration known to cause liver toxicity). -We will check hepatitis panel . -Continue to trend liver enzymes and continue with IV fluids  Hypertension -Continue with home medications, will add as needed hydralazine  Mixed hyperlipidemia -Patient does not take Zetia anymore, certainly would not resume, he is on Repatha, due tomorrow, I have instructed him to hold on that as well he is seen by his cardiologist.      DVT Prophylaxis  Lovenox   AM Labs Ordered, also please review Full Orders  Family Communication: Admission, patients condition and plan of care including tests being ordered have been discussed with the patient  who indicate understanding and agree with the plan and Code Status.  Code Status Full  Likely DC to  home  Condition GUARDED    Consults called: none    Admission status: inpatient    Time spent in minutes : 60 minutes   Phillips Climes M.D on 12/28/2021 at 3:34 PM   [facility] - Office  [phone]

## 2021-12-28 NOTE — Telephone Encounter (Signed)
Date and time results received: 12/28/21 824  Test: CK Critical Value: 18,748  Name of Provider Notified: Jesse Sans Leath,NP  Orders Received? Or Actions Taken?: Actions Taken: Called Pt and notified of abnormal labs and the need to follow-up with local ED. Pt verbalized understanding.

## 2021-12-28 NOTE — ED Triage Notes (Signed)
Pt was seen at Urgent care yesterday for left arm pain and was dx with tendonitis. Pt had blood drawn and was called this morning and told to come to ER for evaluation of elevated CK. Pt complains of left flank pain started this morning, complains of bilateral arm pain from working out but left forearm and left bicep pain hurts most. Pt on blood pressure medication and is concerned that it is messing with kidneys.

## 2021-12-28 NOTE — Plan of Care (Signed)
  Problem: Education: Goal: Knowledge of General Education information will improve Description Including pain rating scale, medication(s)/side effects and non-pharmacologic comfort measures Outcome: Progressing   

## 2021-12-28 NOTE — ED Provider Notes (Signed)
[facility] EMERGENCY DEPARTMENT Provider Note   CSN: [id number] Arrival date & time: 12/28/21  1009     History  Chief Complaint  Patient presents with   Abnormal Labs    Tyler Dean is a 36 y.o. male.  HPI     Tyler Dean is a 36 y.o. male who presents to the Emergency Department requesting evaluation of abnormal labs.  Seen at urgent care yesterday for left arm pain and diagnosed with tendinitis.  He was started on cyclobenzaprine and prednisone.  Arm pain is somewhat improved today.  He recently began lifting weights again and noticed some pain to both arms, legs and left flank area.  He has been on antihypertensive medications and cholesterol medications for several months.  He is concerned that these medications are affecting his kidneys.  He requested to have labs drawn yesterday at urgent care.  He was contacted this morning stating that his CK level was elevated and that he needed to come to the ER for further evaluation.  He denies any chest pain, cramping or shortness of breath.   Home Medications Prior to Admission medications   Medication Sig Start Date End Date Taking? Authorizing Provider  albuterol (VENTOLIN HFA) 108 (90 Base) MCG/ACT inhaler Inhale 1-2 puffs into the lungs as needed. 01/03/21   [provider]  amLODipine (NORVASC) 10 MG tablet Take 10 mg by mouth daily. 07/12/21   [provider]  Atogepant (QULIPTA) 60 MG TABS Take 60 mg by mouth daily. 07/26/21   Melvenia Beam, MD  cyclobenzaprine (FLEXERIL) 10 MG tablet Take 1 tablet (10 mg total) by mouth 3 (three) times daily as needed for muscle spasms. Do not drink alcohol or drive while taking these medications.  May cause drowsiness. 12/27/21   Volney American, PA-C  ezetimibe (ZETIA) 10 MG tablet Take 1 tablet (10 mg total) by mouth daily. 08/29/21 08/24/22  Satira Sark, MD  losartan (COZAAR) 100 MG tablet Take 100 mg by mouth daily. 08/22/21   [provider]   predniSONE (DELTASONE) 20 MG tablet Take 2 tablets (40 mg total) by mouth daily with breakfast. 12/27/21   Volney American, PA-C  REPATHA SURECLICK 353 MG/ML SOAJ INJECT 140 MG  SUBCUTANEOUSLY EVERY 14 DAYS 12/03/21   Satira Sark, MD  rizatriptan (MAXALT-MLT) 10 MG disintegrating tablet 1 tablet Orally Once a day for 1 day(s) 07/26/21   Melvenia Beam, MD      Allergies    Atorvastatin, Crestor [rosuvastatin], and Pravastatin    Review of Systems   Review of Systems  Constitutional:  Negative for chills and fever.  Respiratory:  Negative for chest tightness, shortness of breath and wheezing.   Cardiovascular:  Negative for chest pain.  Gastrointestinal:  Negative for abdominal pain, diarrhea and nausea.  Genitourinary:  Positive for flank pain.  Musculoskeletal:  Positive for myalgias (bilateral arm pain). Negative for arthralgias.  Neurological:  Negative for dizziness, syncope, weakness, numbness and headaches.    Physical Exam Updated Vital Signs BP (!) 153/112 (BP Location: Right Arm)   Pulse (!) 103   Temp 98.2 F (36.8 C) (Oral)   Resp 20   Ht 6' (1.829 m)   Wt 102.1 kg   SpO2 98%   BMI 30.52 kg/m  Physical Exam Vitals and nursing note reviewed.  Constitutional:      General: He is not in acute distress.    Appearance: Normal appearance. He is not ill-appearing.  HENT:  Mouth/Throat:     Mouth: Mucous membranes are moist.  Cardiovascular:     Rate and Rhythm: Normal rate and regular rhythm.     Pulses: Normal pulses.  Pulmonary:     Effort: Pulmonary effort is normal. No respiratory distress.  Chest:     Chest wall: No tenderness.  Abdominal:     General: There is no distension.     Palpations: Abdomen is soft.     Tenderness: There is no abdominal tenderness.  Musculoskeletal:        General: Tenderness (tenderness to palpation of the left upper arm.  able to FROM of the left elbow without pain) present. Normal range of motion.     Cervical  back: Normal range of motion.  Skin:    General: Skin is warm.     Capillary Refill: Capillary refill takes less than 2 seconds.     Findings: No bruising, erythema or rash.  Neurological:     General: No focal deficit present.     Mental Status: He is alert.     Sensory: No sensory deficit.     Motor: No weakness.     ED Results / Procedures / Treatments   Labs (all labs ordered are listed, but only abnormal results are displayed) Labs Reviewed  CBC WITH DIFFERENTIAL/PLATELET - Abnormal; Notable for the following components:      Result Value   Neutro Abs 8.3 (*)    All other components within normal limits  COMPREHENSIVE METABOLIC PANEL - Abnormal; Notable for the following components:   Glucose, Bld 107 (*)    AST 232 (*)    ALT 132 (*)    All other components within normal limits  CK - Abnormal; Notable for the following components:   Total CK 17,199 (*)    All other components within normal limits  URINALYSIS, ROUTINE W REFLEX MICROSCOPIC - Abnormal; Notable for the following components:   Hgb urine dipstick SMALL (*)    All other components within normal limits  RAPID URINE DRUG SCREEN, HOSP PERFORMED  HEPATITIS PANEL, ACUTE    EKG None  Radiology No results found.  Procedures Procedures    Medications Ordered in ED Medications - No data to display  ED Course/ Medical Decision Making/ A&P                           Medical Decision Making Patient here for evaluation of elevated CK.  Seen at urgent care yesterday for left arm pain diagnosed with tendinitis.  Continuing to have bilateral arm pain concerned that his antihypertensive and cholesterol medications may be affecting his kidneys and concerned that he has rhabdomyolysis.  He had blood work done yesterday at urgent care and he was contacted this morning stating that his CK was elevated and that he needed evaluation in the ER  My exam, patient hypertensive nontoxic-appearing.  He is having mild pains of  his left flank and bilateral upper extremities.  Pain worse to the left arm.  Neurovascularly intact.  No excessive warmth or erythema of the elbow.  Differential would include but not limited to rhabdomyolysis, chemistry derangement, AKI myalgias, viral process.  We will recheck labs here today  Amount and/or Complexity of Data Reviewed Labs: ordered.    Details: Labs show no evidence of leukocytosis, chemistries show some mild transaminitis with AST elevated at 232 ALT 132 alk phos and total bilirubin are unremarkable.  Kidney functions reassuring.  Total  CK today is 17,199.  Urinalysis without evidence of infection Discussion of management or test interpretation with external provider(s): Patient hypertensive here today, takes losartan daily.  Given IV hydralazine here and blood pressure much improved.  He is given IV fluids.  He is agreeable to hospital admission for his rhabdomyolysis  Discussed findings with [facility] hospitalist, Dr. Waldron Labs who is agreeable to admit  Risk Prescription drug management. Decision regarding hospitalization.           Final Clinical Impression(s) / ED Diagnoses Final diagnoses:  Non-traumatic rhabdomyolysis    Rx / DC Orders ED Discharge Orders     None         Kem Parkinson, PA-C 12/28/21 1622    Wyvonnia Dusky, MD 12/28/21 1711

## 2021-12-29 DIAGNOSIS — E782 Mixed hyperlipidemia: Secondary | ICD-10-CM | POA: Diagnosis not present

## 2021-12-29 DIAGNOSIS — I10 Essential (primary) hypertension: Secondary | ICD-10-CM | POA: Diagnosis not present

## 2021-12-29 DIAGNOSIS — M6282 Rhabdomyolysis: Secondary | ICD-10-CM | POA: Diagnosis not present

## 2021-12-29 LAB — HIV ANTIBODY (ROUTINE TESTING W REFLEX): HIV Screen 4th Generation wRfx: NONREACTIVE

## 2021-12-29 LAB — COMPREHENSIVE METABOLIC PANEL
ALT: 101 U/L — ABNORMAL HIGH (ref 0–44)
AST: 137 U/L — ABNORMAL HIGH (ref 15–41)
Albumin: 3.6 g/dL (ref 3.5–5.0)
Alkaline Phosphatase: 51 U/L (ref 38–126)
Anion gap: 5 (ref 5–15)
BUN: 15 mg/dL (ref 6–20)
CO2: 24 mmol/L (ref 22–32)
Calcium: 8.5 mg/dL — ABNORMAL LOW (ref 8.9–10.3)
Chloride: 111 mmol/L (ref 98–111)
Creatinine, Ser: 0.72 mg/dL (ref 0.61–1.24)
GFR, Estimated: >60 mL/min (ref >60)
Glucose, Bld: 98 mg/dL (ref 70–99)
Potassium: 3.9 mmol/L (ref 3.5–5.1)
Sodium: 140 mmol/L (ref 135–145)
Total Bilirubin: 0.6 mg/dL (ref 0.3–1.2)
Total Protein: 6 g/dL — ABNORMAL LOW (ref 6.5–8.1)

## 2021-12-29 LAB — CBC
HCT: 39.5 % (ref 39.0–52.0)
Hemoglobin: 13.3 g/dL (ref 13.0–17.0)
MCH: 31.5 pg (ref 26.0–34.0)
MCHC: 33.7 g/dL (ref 30.0–36.0)
MCV: 93.6 fL (ref 80.0–100.0)
Platelets: 270 10*3/uL (ref 150–400)
RBC: 4.22 MIL/uL (ref 4.22–5.81)
RDW: 12.3 % (ref 11.5–15.5)
WBC: 11.8 10*3/uL — ABNORMAL HIGH (ref 4.0–10.5)
nRBC: 0 % (ref 0.0–0.2)

## 2021-12-29 LAB — HEPATITIS PANEL, ACUTE
HCV Ab: NONREACTIVE
Hep A IgM: NONREACTIVE
Hep B C IgM: NONREACTIVE
Hepatitis B Surface Ag: NONREACTIVE

## 2021-12-29 LAB — CK
Total CK: 7435 U/L — ABNORMAL HIGH (ref 49–397)
Total CK: 8360 U/L — ABNORMAL HIGH (ref 49–397)

## 2021-12-29 MED ORDER — OXYCODONE HCL 5 MG PO TABS
5.0000 mg | ORAL_TABLET | Freq: Four times a day (QID) | ORAL | Status: DC | PRN
  Administered 2021-12-29 – 2021-12-30 (×2): 5 mg via ORAL
  Filled 2021-12-29 (×2): qty 1

## 2021-12-29 MED ORDER — LACTATED RINGERS IV BOLUS
1000.0000 mL | Freq: Once | INTRAVENOUS | Status: AC
  Administered 2021-12-29: 1000 mL via INTRAVENOUS

## 2021-12-29 MED ORDER — BISACODYL 5 MG PO TBEC
10.0000 mg | DELAYED_RELEASE_TABLET | Freq: Once | ORAL | Status: DC
Start: 2021-12-29 — End: 2021-12-30
  Filled 2021-12-29: qty 2

## 2021-12-29 MED ORDER — LACTATED RINGERS IV SOLN: INTRAVENOUS | Status: DC

## 2021-12-29 MED ORDER — SODIUM CHLORIDE 0.9 % IV BOLUS
1000.0000 mL | Freq: Once | INTRAVENOUS | Status: AC
  Administered 2021-12-29: 1000 mL via INTRAVENOUS

## 2021-12-29 MED ORDER — KETOROLAC TROMETHAMINE 30 MG/ML IJ SOLN
30.0000 mg | Freq: Four times a day (QID) | INTRAMUSCULAR | Status: DC | PRN
Start: 2021-12-29 — End: 2021-12-30
  Administered 2021-12-29: 30 mg via INTRAVENOUS
  Filled 2021-12-29: qty 1

## 2021-12-29 NOTE — Progress Notes (Signed)
PROGRESS NOTE    Tyler Dean  IRC:[id number] DOB: 12-06-1985 DOA: 12/28/2021 PCP: Lavella Lemons, PA    Brief Narrative:  36 year old male admitted to the hospital with rhabdomyolysis after excessive exercise.  Admitted for IV fluids.   Assessment & Plan:   Principal Problem:   Rhabdomyolysis Active Problems:   Essential hypertension   Mixed hyperlipidemia  Rhabdomyolysis -Secondary to excessive exercise/working out -CK elevated at 18,748 on admission -Improved to 8360 with IV fluids -Renal function is normal -Continue IV fluids  Transaminitis -Suspect this is secondary to rhabdomyolysis -Hepatitis panel negative -Overall liver enzymes trending down  Hypertension -Continued on home medications  Hyperlipidemia -Chronically on Repatha   DVT prophylaxis: enoxaparin (LOVENOX) injection 40 mg Start: 12/29/21 2200 SCDs Start: 12/28/21 1635  Code Status: Full code Family Communication: Discussed with fianc at the bedside Disposition Plan: Status is: Inpatient Remains inpatient appropriate because: Continued IV fluids     Consultants:    Procedures:    Antimicrobials:      Subjective: Says that pain in his arm and back is mildly better today.  Objective: Vitals:   12/28/21 1710 12/28/21 2126 12/29/21 0500 12/29/21 1346  BP: (!) 142/66 (!) 149/101 120/83 (!) 142/86  Pulse: 94 82 85 100  Resp: 20   18  Temp: 97.8 F (36.6 C) 99.4 F (37.4 C) 98.5 F (36.9 C) 98.4 F (36.9 C)  TempSrc: Oral   Oral  SpO2: 99% 98% 98% 99%  Weight: 101 kg     Height: 6' (1.829 m)       Intake/Output Summary (Last 24 hours) at 12/29/2021 1855 Last data filed at 12/29/2021 1801 Gross per 24 hour  Intake 920 ml  Output --  Net 920 ml   Filed Weights   12/28/21 1030 12/28/21 1710  Weight: 102.1 kg 101 kg    Examination:  General exam: Appears calm and comfortable  Respiratory system: Clear to auscultation. Respiratory effort normal. Cardiovascular system:  S1 & S2 heard, RRR. No JVD, murmurs, rubs, gallops or clicks. No pedal edema. Gastrointestinal system: Abdomen is nondistended, soft and nontender. No organomegaly or masses felt. Normal bowel sounds heard. Central nervous system: Alert and oriented. No focal neurological deficits. Extremities: Symmetric 5 x 5 power. Skin: No rashes, lesions or ulcers Psychiatry: Judgement and insight appear normal. Mood & affect appropriate.     Data Reviewed: I have personally reviewed following labs and imaging studies  CBC: Recent Labs  Lab 12/28/21 1146 12/29/21 0503  WBC 10.5 11.8*  NEUTROABS 8.3*  --   HGB 15.3 13.3  HCT 44.7 39.5  MCV 91.0 93.6  PLT 327 510   Basic Metabolic Panel: Recent Labs  Lab 12/27/21 1007 12/28/21 1146 12/29/21 0503  NA 141 138 140  K 4.5 4.2 3.9  CL 102 104 111  CO2 '24 26 24  '$ GLUCOSE 97 107* 98  BUN '12 16 15  '$ CREATININE 0.81 0.84 0.72  CALCIUM 9.3 9.8 8.5*   GFR: Estimated Creatinine Clearance: 158.6 mL/min (by C-G formula based on SCr of 0.72 mg/dL). Liver Function Tests: Recent Labs  Lab 12/27/21 1007 12/28/21 1146 12/29/21 0503  AST 210* 232* 137*  ALT 112* 132* 101*  ALKPHOS 75 66 51  BILITOT 0.2 0.5 0.6  PROT 7.0 7.7 6.0*  ALBUMIN 4.8 4.6 3.6   No results for input(s): "LIPASE", "AMYLASE" in the last 168 hours. No results for input(s): "AMMONIA" in the last 168 hours. Coagulation Profile: No results for input(s): "INR", "PROTIME" in  the last 168 hours. Cardiac Enzymes: Recent Labs  Lab 12/27/21 1007 12/28/21 1146 12/29/21 0503 12/29/21 1459  CKTOTAL 18,748* 17,199* 7,435* 8,360*   BNP (last 3 results) No results for input(s): "PROBNP" in the last 8760 hours. HbA1C: No results for input(s): "HGBA1C" in the last 72 hours. CBG: No results for input(s): "GLUCAP" in the last 168 hours. Lipid Profile: No results for input(s): "CHOL", "HDL", "LDLCALC", "TRIG", "CHOLHDL", "LDLDIRECT" in the last 72 hours. Thyroid Function  Tests: No results for input(s): "TSH", "T4TOTAL", "FREET4", "T3FREE", "THYROIDAB" in the last 72 hours. Anemia Panel: No results for input(s): "VITAMINB12", "FOLATE", "FERRITIN", "TIBC", "IRON", "RETICCTPCT" in the last 72 hours. Sepsis Labs: No results for input(s): "PROCALCITON", "LATICACIDVEN" in the last 168 hours.  No results found for this or any previous visit (from the past 240 hour(s)).       Radiology Studies: No results found.      Scheduled Meds:  bisacodyl  10 mg Oral Once   enoxaparin (LOVENOX) injection  40 mg Subcutaneous Q24H   losartan  100 mg Oral Daily   Continuous Infusions:  lactated ringers 150 mL/hr at 12/29/21 1743     LOS: 1 day    Time spent: 58mns    JKathie Dike MD [facility]   If 7PM-7AM, please contact night-coverage [email or web address]  12/29/2021, 6:55 PM

## 2021-12-30 DIAGNOSIS — E782 Mixed hyperlipidemia: Secondary | ICD-10-CM | POA: Diagnosis not present

## 2021-12-30 DIAGNOSIS — I10 Essential (primary) hypertension: Secondary | ICD-10-CM | POA: Diagnosis not present

## 2021-12-30 DIAGNOSIS — M6282 Rhabdomyolysis: Secondary | ICD-10-CM | POA: Diagnosis not present

## 2021-12-30 LAB — CK: Total CK: 5725 U/L — ABNORMAL HIGH (ref 49–397)

## 2021-12-30 LAB — CBC
HCT: 38.5 % — ABNORMAL LOW (ref 39.0–52.0)
Hemoglobin: 12.9 g/dL — ABNORMAL LOW (ref 13.0–17.0)
MCH: 31 pg (ref 26.0–34.0)
MCHC: 33.5 g/dL (ref 30.0–36.0)
MCV: 92.5 fL (ref 80.0–100.0)
Platelets: 281 10*3/uL (ref 150–400)
RBC: 4.16 MIL/uL — ABNORMAL LOW (ref 4.22–5.81)
RDW: 12.2 % (ref 11.5–15.5)
WBC: 5.5 10*3/uL (ref 4.0–10.5)
nRBC: 0 % (ref 0.0–0.2)

## 2021-12-30 LAB — COMPREHENSIVE METABOLIC PANEL
ALT: 117 U/L — ABNORMAL HIGH (ref 0–44)
AST: 132 U/L — ABNORMAL HIGH (ref 15–41)
Albumin: 3.5 g/dL (ref 3.5–5.0)
Alkaline Phosphatase: 50 U/L (ref 38–126)
Anion gap: 4 — ABNORMAL LOW (ref 5–15)
BUN: 13 mg/dL (ref 6–20)
CO2: 25 mmol/L (ref 22–32)
Calcium: 8.4 mg/dL — ABNORMAL LOW (ref 8.9–10.3)
Chloride: 110 mmol/L (ref 98–111)
Creatinine, Ser: 0.78 mg/dL (ref 0.61–1.24)
GFR, Estimated: >60 mL/min (ref >60)
Glucose, Bld: 95 mg/dL (ref 70–99)
Potassium: 3.9 mmol/L (ref 3.5–5.1)
Sodium: 139 mmol/L (ref 135–145)
Total Bilirubin: 0.6 mg/dL (ref 0.3–1.2)
Total Protein: 5.9 g/dL — ABNORMAL LOW (ref 6.5–8.1)

## 2021-12-30 NOTE — Progress Notes (Signed)
Nsg Discharge Note  Admit Date:  12/28/2021 Discharge date: 12/30/2021   Tyler Dean to be D/C'd Home per MD order.  AVS completed.  Copy for chart, and copy for patient signed, and dated. Patient/caregiver able to verbalize understanding.  Discharge Medication: Allergies as of 12/30/2021       Reactions   Atorvastatin    myalgias   Crestor [rosuvastatin]    Myalgia   Pravastatin    myalgias        Medication List     STOP taking these medications    cyclobenzaprine 10 MG tablet Commonly known as: FLEXERIL   predniSONE 20 MG tablet Commonly known as: DELTASONE       TAKE these medications    buPROPion 300 MG 24 hr tablet Commonly known as: WELLBUTRIN XL Take 300 mg by mouth daily.   losartan 100 MG tablet Commonly known as: COZAAR Take 100 mg by mouth daily.   Repatha SureClick 051 MG/ML Soaj Generic drug: Evolocumab INJECT 140 MG  SUBCUTANEOUSLY EVERY 14 DAYS What changed: See the new instructions.        Discharge Assessment: Vitals:   12/29/21 2101 12/30/21 0429  BP: (!) 138/92 111/80  Pulse: 88 83  Resp:    Temp: 98.4 F (36.9 C) (!) 97.4 F (36.3 C)  SpO2: 97% 97%   Skin clean, dry and intact without evidence of skin break down, no evidence of skin tears noted. IV catheter discontinued intact. Site without signs and symptoms of complications - no redness or edema noted at insertion site, patient denies c/o pain - only slight tenderness at site.  Dressing with slight pressure applied.  D/c Instructions-Education: Discharge instructions given to patient/family with verbalized understanding. D/c education completed with patient/family including follow up instructions, medication list, d/c activities limitations if indicated, with other d/c instructions as indicated by MD - patient able to verbalize understanding, all questions fully answered. Patient instructed to return to ED, call 911, or call MD for any changes in condition.  Patient escorted  via [place], and D/C home via private auto.  Clovis Fredrickson, LPN 1/0/2111 73:56 PM

## 2021-12-30 NOTE — Discharge Summary (Signed)
Physician Discharge Summary  Tyler Dean:[id number] DOB: 1986-04-06 DOA: 12/28/2021  PCP: Lavella Lemons, PA  Admit date: 12/28/2021 Discharge date: 12/30/2021  Admitted From: Home Disposition: Home  Recommendations for Outpatient Follow-up:  Follow up with PCP in 1-2 weeks Repeat CK and liver enzymes in 1 week Continue aggressive oral hydration   Discharge Condition: Stable CODE STATUS: Full code Diet recommendation: Regular diet  Brief/Interim Summary: 36 year old male admitted to the hospital with rhabdomyolysis after excessive exercise.  Admitted for IV fluids  Discharge Diagnoses:  Principal Problem:   Rhabdomyolysis Active Problems:   Essential hypertension   Mixed hyperlipidemia  Rhabdomyolysis -Secondary to excessive exercise/working out -CK elevated at 18,748 on admission -Improved to 5725 with IV fluids -Renal function is normal -Recommend that patient continue aggressive oral hydration -Recheck CK level with PCP in 1 week   Transaminitis -Suspect this is secondary to rhabdomyolysis -Hepatitis panel negative -Overall liver enzymes trending down -Recheck in 1 week   Hypertension -Continued on home medications   Hyperlipidemia -Chronically on Repatha  Discharge Instructions  Discharge Instructions     Diet - low sodium heart healthy   Complete by: As directed    Increase activity slowly   Complete by: As directed       Allergies as of 12/30/2021       Reactions   Atorvastatin    myalgias   Crestor [rosuvastatin]    Myalgia   Pravastatin    myalgias        Medication List     STOP taking these medications    cyclobenzaprine 10 MG tablet Commonly known as: FLEXERIL   predniSONE 20 MG tablet Commonly known as: DELTASONE       TAKE these medications    buPROPion 300 MG 24 hr tablet Commonly known as: WELLBUTRIN XL Take 300 mg by mouth daily.   losartan 100 MG tablet Commonly known as: COZAAR Take 100 mg by mouth  daily.   Repatha SureClick 588 MG/ML Soaj Generic drug: Evolocumab INJECT 140 MG  SUBCUTANEOUSLY EVERY 14 DAYS What changed: See the new instructions.        Follow-up Information     Lavella Lemons, [place]. Schedule an appointment as soon as possible for a visit in 1 week(s).   Specialty: Physician Assistant Contact information: [place] [address] [phone]                Allergies  Allergen Reactions   Atorvastatin     myalgias   Crestor [Rosuvastatin]     Myalgia   Pravastatin     myalgias    Consultations:    Procedures/Studies: No results found.    Subjective: He is feeling better today.  Muscle soreness is improving.  He is able to ambulate.  Discharge Exam: Vitals:   12/29/21 0500 12/29/21 1346 12/29/21 2101 12/30/21 0429  BP: 120/83 (!) 142/86 (!) 138/92 111/80  Pulse: 85 100 88 83  Resp:  18    Temp: 98.5 F (36.9 C) 98.4 F (36.9 C) 98.4 F (36.9 C) (!) 97.4 F (36.3 C)  TempSrc:  Oral Oral Oral  SpO2: 98% 99% 97% 97%  Weight:      Height:        General: Pt is alert, awake, not in acute distress Cardiovascular: RRR, S1/S2 +, no rubs, no gallops Respiratory: CTA bilaterally, no wheezing, no rhonchi Abdominal: Soft, NT, ND, bowel sounds + Extremities: no edema, no cyanosis    The  results of significant diagnostics from this hospitalization (including imaging, microbiology, ancillary and laboratory) are listed below for reference.     Microbiology: No results found for this or any previous visit (from the past 240 hour(s)).   Labs: BNP (last 3 results) No results for input(s): "BNP" in the last 8760 hours. Basic Metabolic Panel: Recent Labs  Lab 12/27/21 1007 12/28/21 1146 12/29/21 0503 12/30/21 0418  NA 141 138 140 139  K 4.5 4.2 3.9 3.9  CL 102 104 111 110  CO2 '24 26 24 25  '$ GLUCOSE 97 107* 98 95  BUN '12 16 15 13  '$ CREATININE 0.81 0.84 0.72 0.78  CALCIUM 9.3 9.8 8.5* 8.4*   Liver Function  Tests: Recent Labs  Lab 12/27/21 1007 12/28/21 1146 12/29/21 0503 12/30/21 0418  AST 210* 232* 137* 132*  ALT 112* 132* 101* 117*  ALKPHOS 75 66 51 50  BILITOT 0.2 0.5 0.6 0.6  PROT 7.0 7.7 6.0* 5.9*  ALBUMIN 4.8 4.6 3.6 3.5   No results for input(s): "LIPASE", "AMYLASE" in the last 168 hours. No results for input(s): "AMMONIA" in the last 168 hours. CBC: Recent Labs  Lab 12/28/21 1146 12/29/21 0503 12/30/21 0418  WBC 10.5 11.8* 5.5  NEUTROABS 8.3*  --   --   HGB 15.3 13.3 12.9*  HCT 44.7 39.5 38.5*  MCV 91.0 93.6 92.5  PLT 327 270 281   Cardiac Enzymes: Recent Labs  Lab 12/27/21 1007 12/28/21 1146 12/29/21 0503 12/29/21 1459 12/30/21 0418  CKTOTAL 18,748* 17,199* 7,435* 8,360* 5,725*   BNP: Invalid input(s): "POCBNP" CBG: No results for input(s): "GLUCAP" in the last 168 hours. D-Dimer No results for input(s): "DDIMER" in the last 72 hours. Hgb A1c No results for input(s): "HGBA1C" in the last 72 hours. Lipid Profile No results for input(s): "CHOL", "HDL", "LDLCALC", "TRIG", "CHOLHDL", "LDLDIRECT" in the last 72 hours. Thyroid function studies No results for input(s): "TSH", "T4TOTAL", "T3FREE", "THYROIDAB" in the last 72 hours.  Invalid input(s): "FREET3" Anemia work up No results for input(s): "VITAMINB12", "FOLATE", "FERRITIN", "TIBC", "IRON", "RETICCTPCT" in the last 72 hours. Urinalysis    Component Value Date/Time   COLORURINE YELLOW 12/28/2021 1150   APPEARANCEUR CLEAR 12/28/2021 1150   LABSPEC 1.010 12/28/2021 1150   PHURINE 7.0 12/28/2021 1150   GLUCOSEU NEGATIVE 12/28/2021 1150   HGBUR SMALL (A) 12/28/2021 1150   BILIRUBINUR NEGATIVE 12/28/2021 1150   KETONESUR NEGATIVE 12/28/2021 1150   PROTEINUR NEGATIVE 12/28/2021 1150   NITRITE NEGATIVE 12/28/2021 1150   LEUKOCYTESUR NEGATIVE 12/28/2021 1150   Sepsis Labs Recent Labs  Lab 12/28/21 1146 12/29/21 0503 12/30/21 0418  WBC 10.5 11.8* 5.5   Microbiology No results found for this  or any previous visit (from the past 240 hour(s)).   Time coordinating discharge: 102mns  SIGNED:   JKathie Dike MD  [facility] 12/30/2021, 7:01 PM   If 7PM-7AM, please contact night-coverage [email or web address]

## 2021-12-30 NOTE — Discharge Instructions (Signed)
Keep drinking fluids to keep urine clear Repeat blood tests for muscle enzymes and liver enzymes in 1 week to ensure stability

## 2022-01-01 DIAGNOSIS — M6282 Rhabdomyolysis: Secondary | ICD-10-CM | POA: Diagnosis not present

## 2022-01-04 DIAGNOSIS — R7989 Other specified abnormal findings of blood chemistry: Secondary | ICD-10-CM | POA: Diagnosis not present

## 2022-01-07 DIAGNOSIS — M6282 Rhabdomyolysis: Secondary | ICD-10-CM | POA: Diagnosis not present

## 2022-01-07 DIAGNOSIS — R300 Dysuria: Secondary | ICD-10-CM | POA: Diagnosis not present

## 2022-01-07 DIAGNOSIS — R1012 Left upper quadrant pain: Secondary | ICD-10-CM | POA: Diagnosis not present

## 2022-01-07 DIAGNOSIS — R1032 Left lower quadrant pain: Secondary | ICD-10-CM | POA: Diagnosis not present

## 2022-01-09 DIAGNOSIS — R1032 Left lower quadrant pain: Secondary | ICD-10-CM | POA: Diagnosis not present

## 2022-01-09 DIAGNOSIS — R109 Unspecified abdominal pain: Secondary | ICD-10-CM | POA: Diagnosis not present

## 2022-01-09 DIAGNOSIS — R300 Dysuria: Secondary | ICD-10-CM | POA: Diagnosis not present

## 2022-01-18 ENCOUNTER — Encounter: Payer: Self-pay | Admitting: Cardiology

## 2022-01-29 IMAGING — CT CT HEAD W/O CM
3 series · 16 of 47 positions shown, 19 images · non-contrast
Comparison: 08/30/2019

CLINICAL DATA: Headache, blurred vision, dizziness

EXAM:
CT HEAD WITHOUT CONTRAST
TECHNIQUE: Contiguous axial images were obtained from the base of the skull
through the vertex without intravenous contrast.

[Series 2: head w o · axial · 0.45mm/px · z∈[+7,+137]mm · 10 of 32 slices shown, 13 images]
[im 3/32  brain]
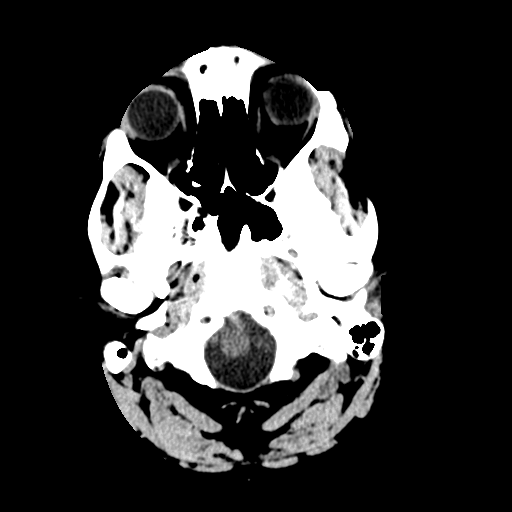
[im 3/32  bone]
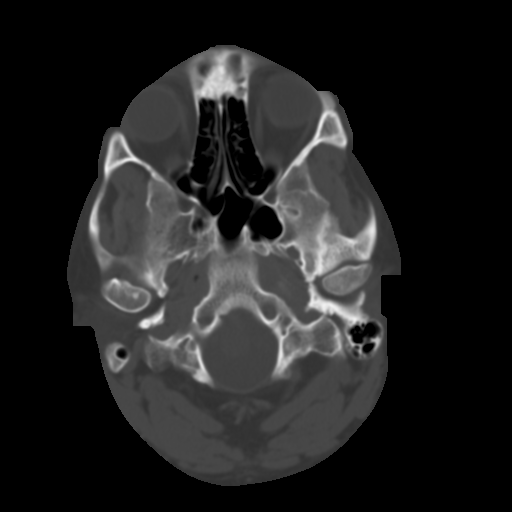
[im 6/32  brain]
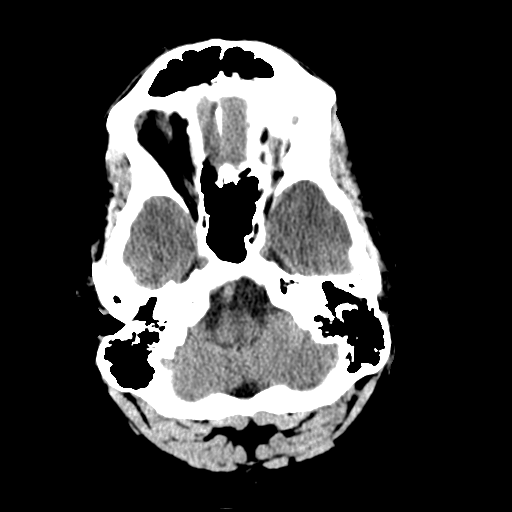
[im 9/32  brain]
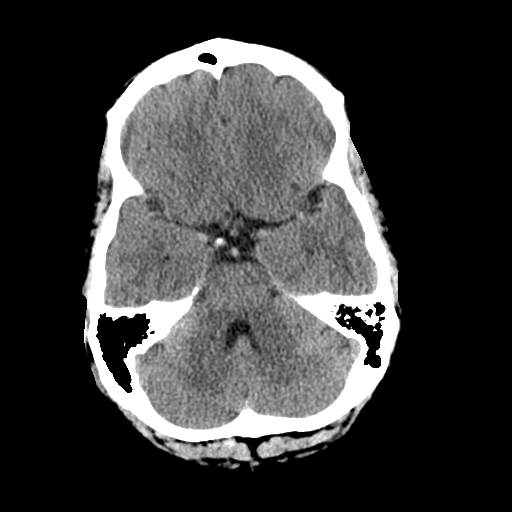
[im 11/32  brain]
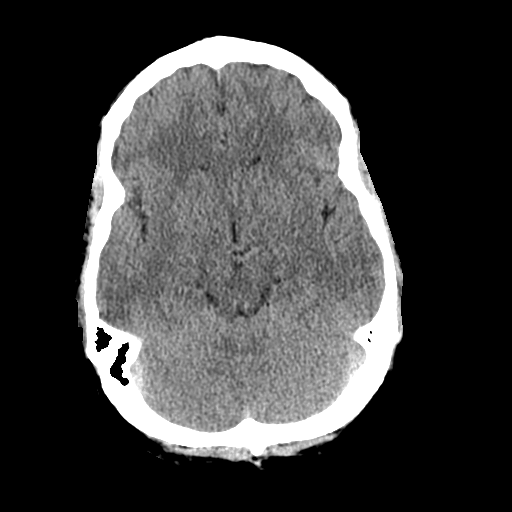
[im 14/32  brain]
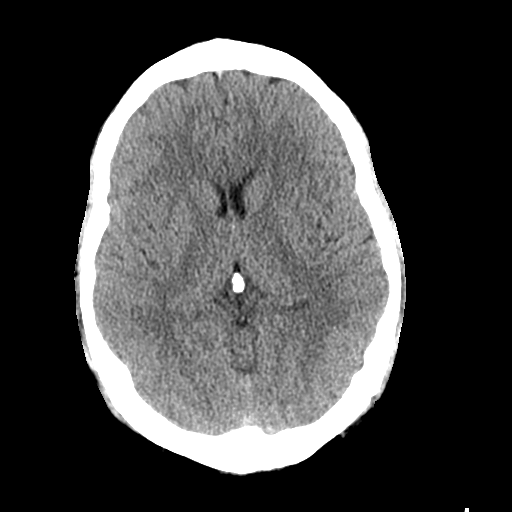
[im 14/32  bone]
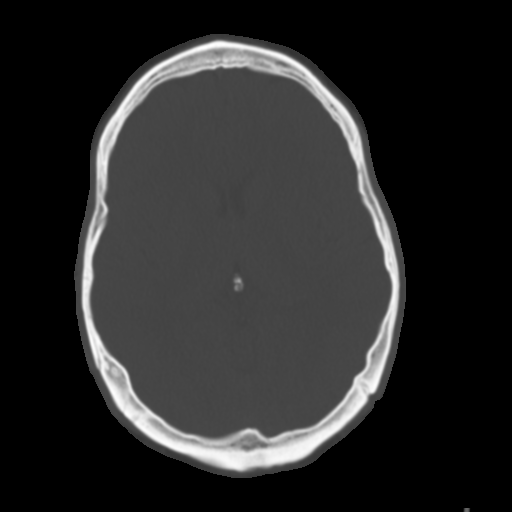
[im 18/32  brain]
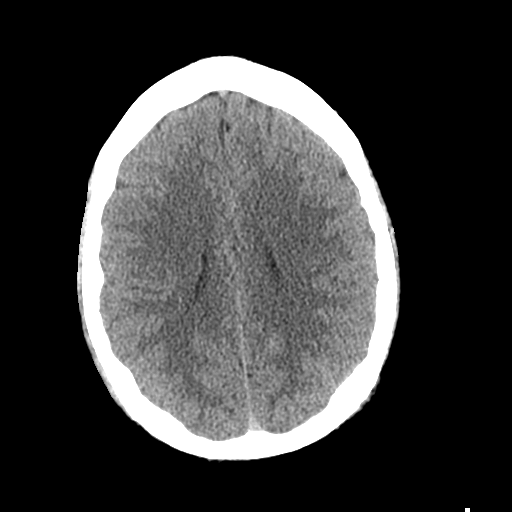
[im 21/32  brain]
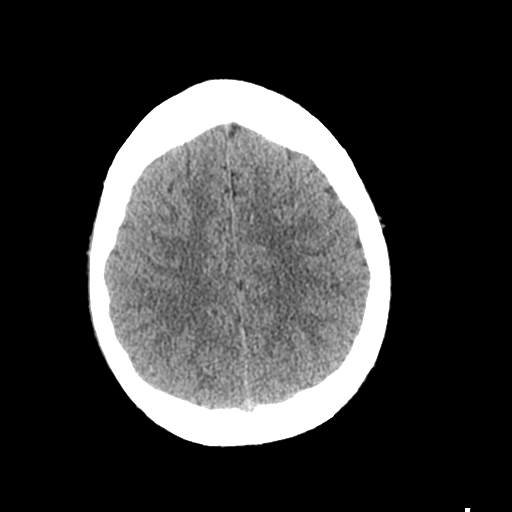
[im 24/32  brain]
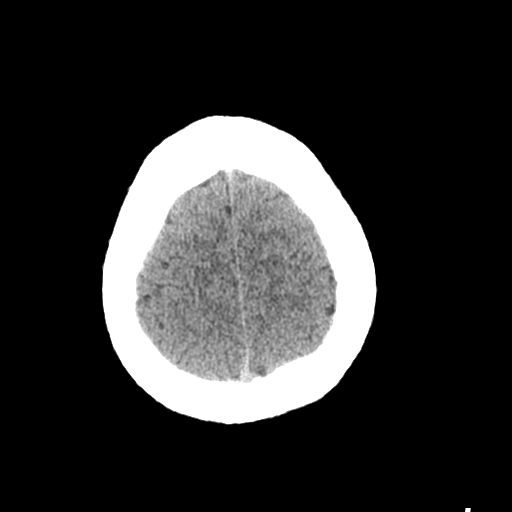
[im 26/32  brain]
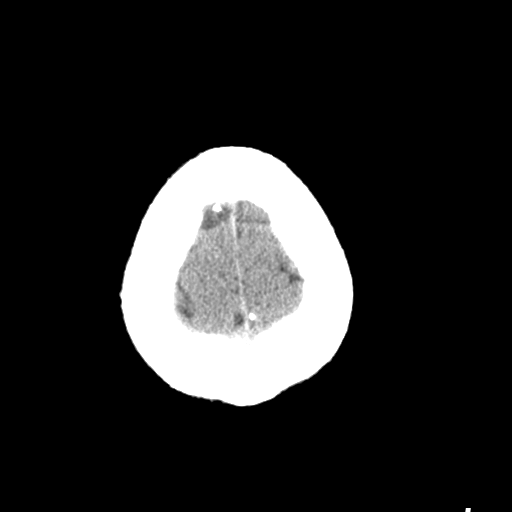
[im 26/32  bone]
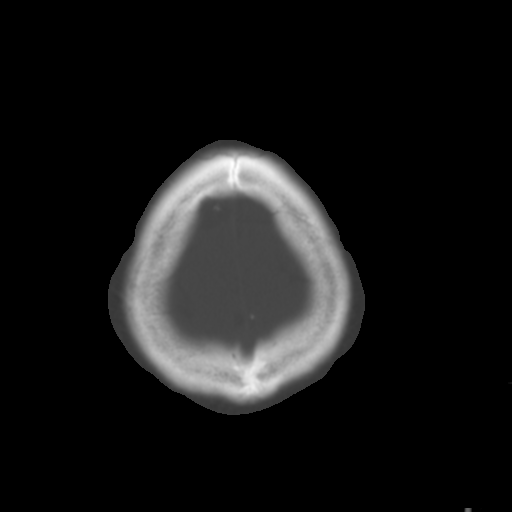
[im 29/32  brain]
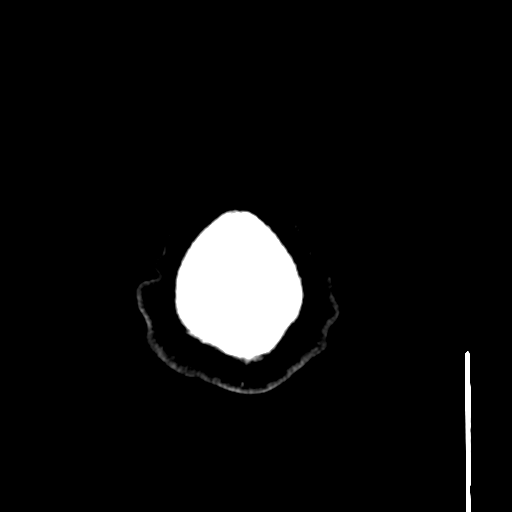

[Series 4: coronal soft · coronal · 0.35mm/px · 3 of 71 slices shown]
[im 24/71  brain]
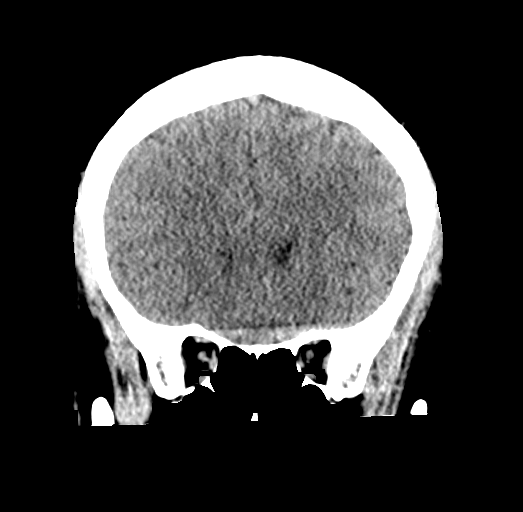
[im 32/71  brain]
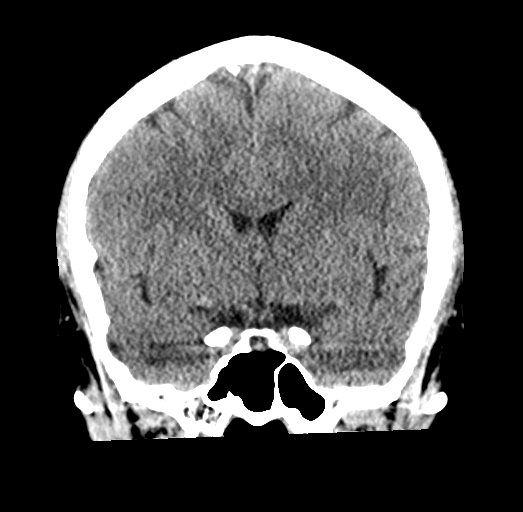
[im 39/71  brain]
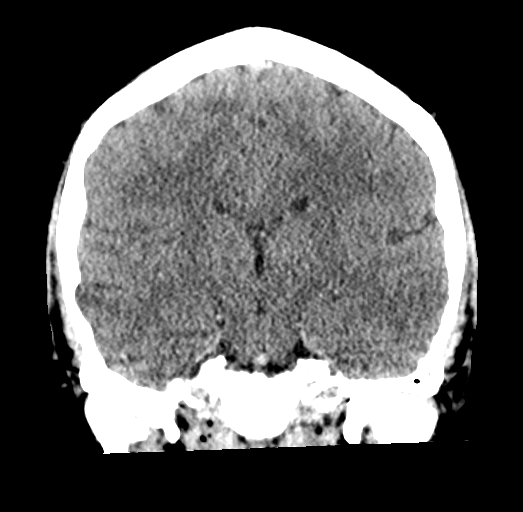

[Series 5: sagittal soft · sagittal · 0.37mm/px · 3 of 61 slices shown]
[im 21/61  brain]
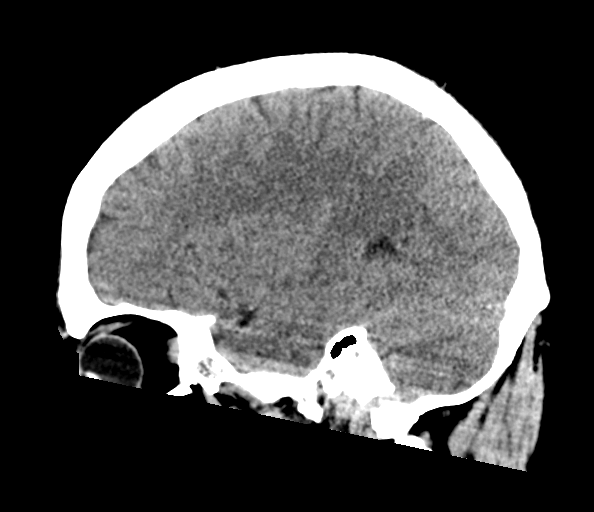
[im 31/61  brain]
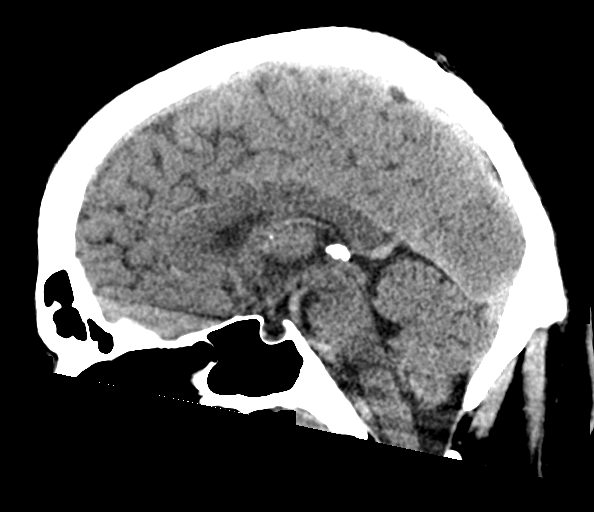
[im 41/61  brain]
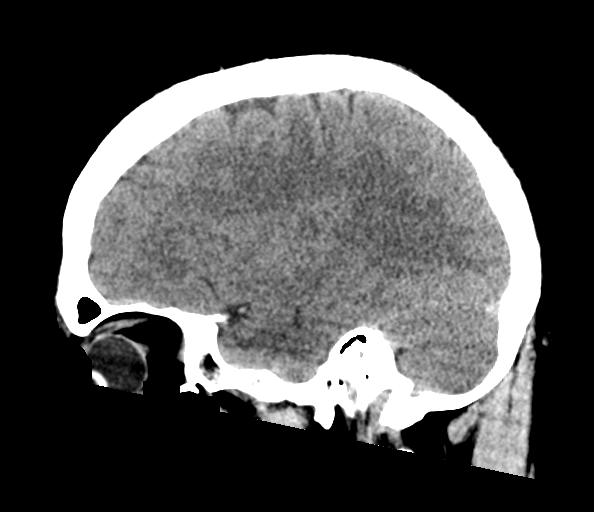

[16 of 47 positions shown; findings below may reference images not displayed]

FINDINGS: Brain: No evidence of acute infarction, hemorrhage, hydrocephalus,
extra-axial collection or mass lesion/mass effect.

Vascular: No hyperdense vessel or unexpected calcification.

Skull: Normal. Negative for fracture or focal lesion.

Sinuses/Orbits: No acute finding.

Other: None.
IMPRESSION: No acute intracranial findings.

## 2022-01-29 IMAGING — DX DG CHEST 2V
2 series · 2 of 2 positions shown · non-contrast
Comparison: February 28, 2016

CLINICAL DATA: Chest pain.

EXAM:
CHEST - 2 VIEW

[chest pa]
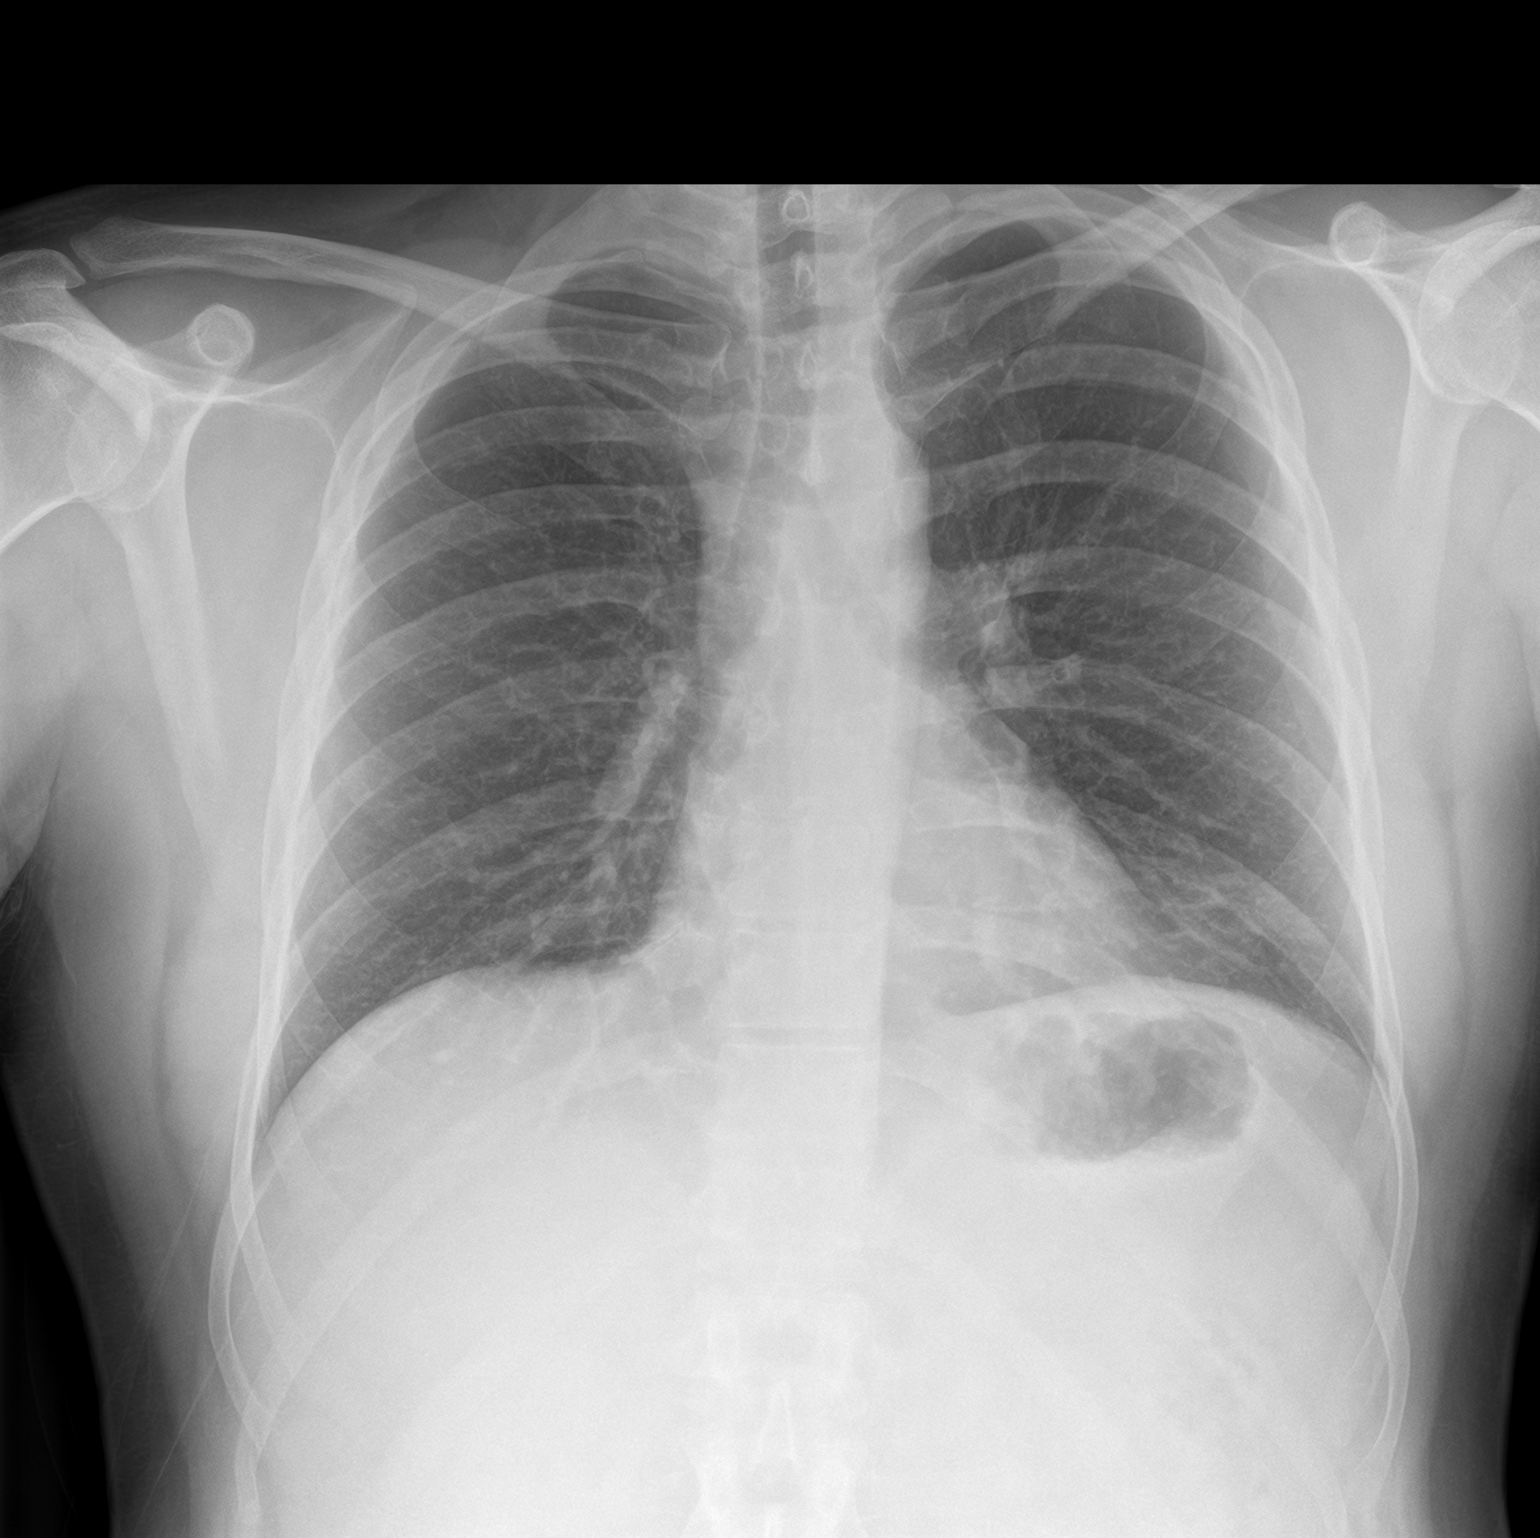

[chest lat]
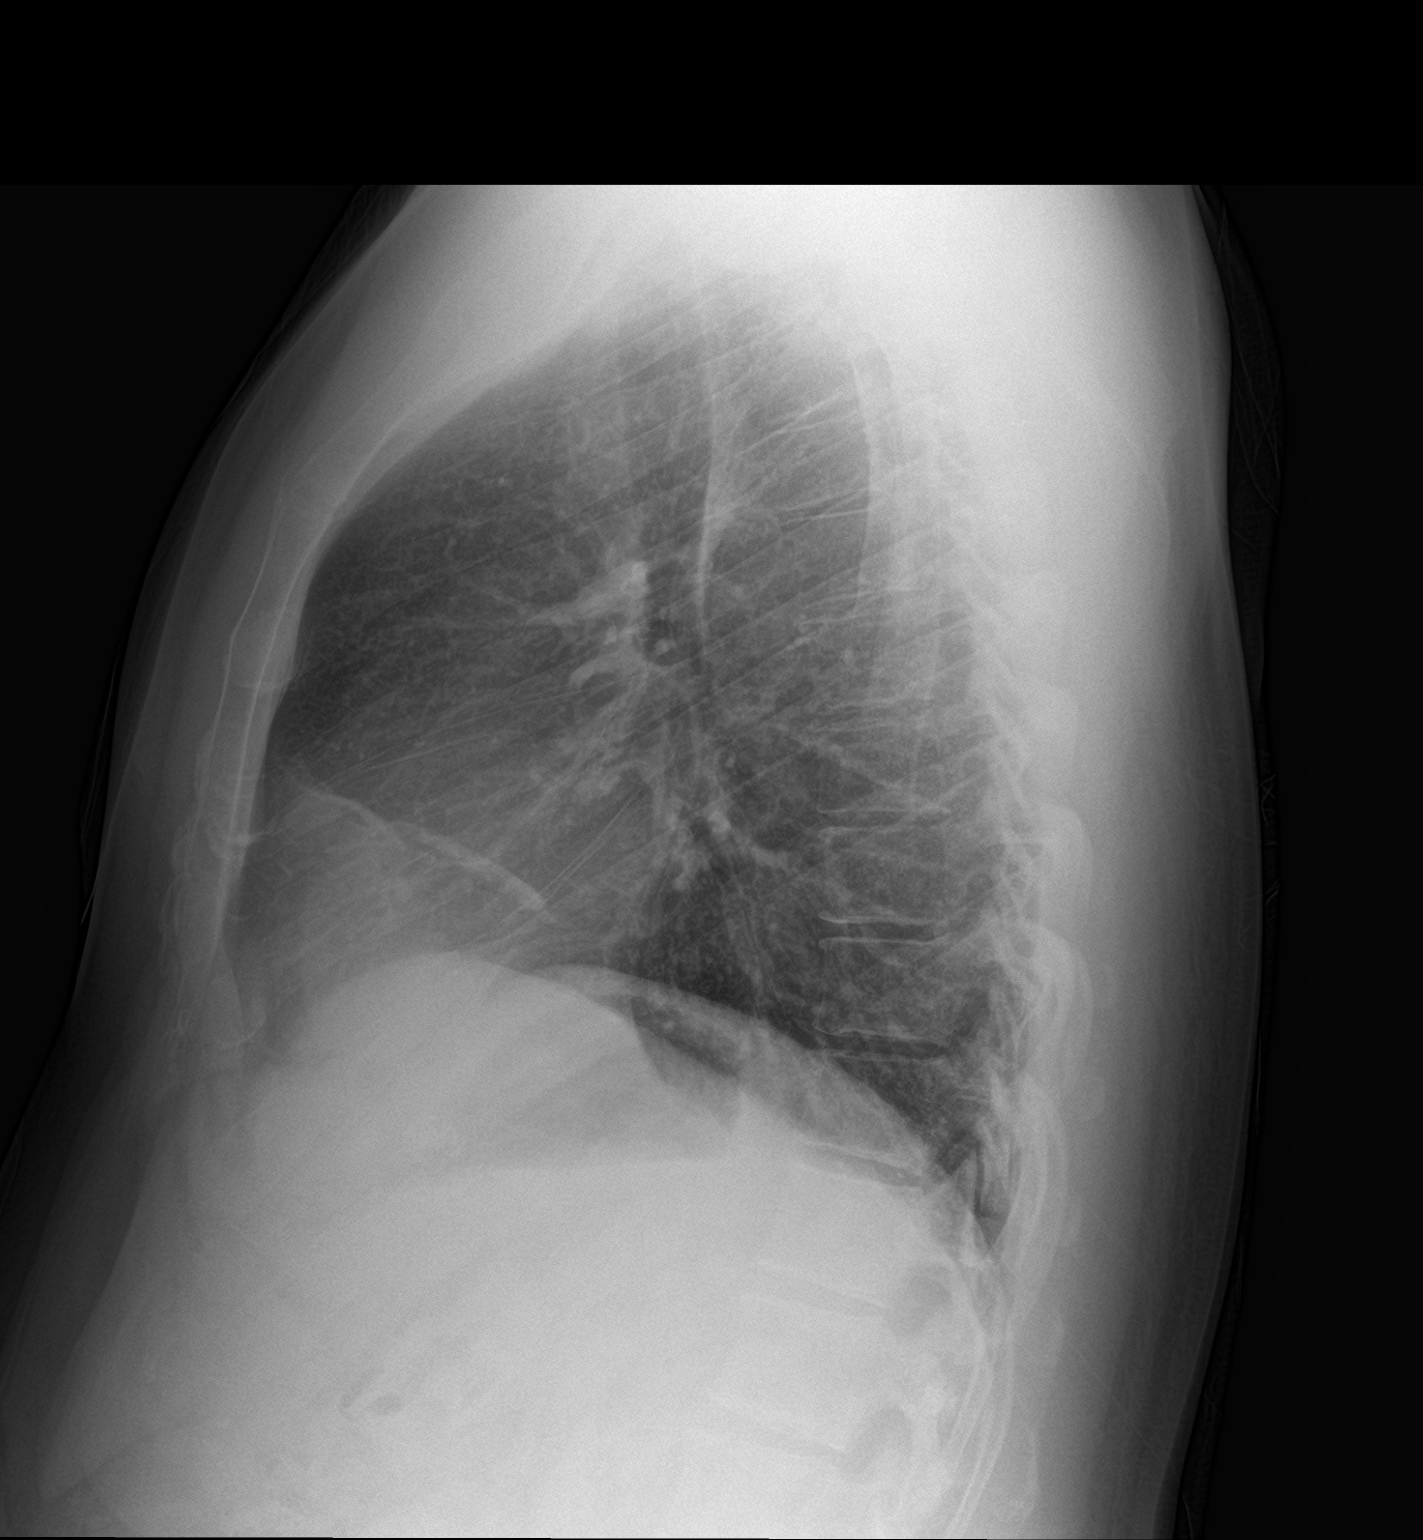

[2 of 2 positions shown; findings below may reference images not displayed]

FINDINGS: The heart size and mediastinal contours are within normal limits. No
consolidation. No pleural effusions or pneumothorax. No acute
osseous abnormality.
IMPRESSION: No acute cardiopulmonary disease.

## 2022-03-06 DIAGNOSIS — Z299 Encounter for prophylactic measures, unspecified: Secondary | ICD-10-CM | POA: Diagnosis not present

## 2022-03-06 DIAGNOSIS — M722 Plantar fascial fibromatosis: Secondary | ICD-10-CM | POA: Diagnosis not present

## 2022-03-06 DIAGNOSIS — I10 Essential (primary) hypertension: Secondary | ICD-10-CM | POA: Diagnosis not present

## 2022-03-11 ENCOUNTER — Encounter: Payer: Self-pay | Admitting: Emergency Medicine

## 2022-03-11 ENCOUNTER — Other Ambulatory Visit: Payer: Self-pay

## 2022-03-11 ENCOUNTER — Ambulatory Visit
Admission: EM | Admit: 2022-03-11 | Discharge: 2022-03-11 | Disposition: A | Discharge status: Home / Self Care | Payer: BC Managed Care – PPO

## 2022-03-11 DIAGNOSIS — R030 Elevated blood-pressure reading, without diagnosis of hypertension: Secondary | ICD-10-CM

## 2022-03-11 DIAGNOSIS — R079 Chest pain, unspecified: Secondary | ICD-10-CM | POA: Diagnosis not present

## 2022-03-11 DIAGNOSIS — I77810 Thoracic aortic ectasia: Secondary | ICD-10-CM | POA: Diagnosis not present

## 2022-03-11 DIAGNOSIS — R0602 Shortness of breath: Secondary | ICD-10-CM

## 2022-03-11 DIAGNOSIS — M79602 Pain in left arm: Secondary | ICD-10-CM | POA: Diagnosis not present

## 2022-03-11 DIAGNOSIS — R000 Tachycardia, unspecified: Secondary | ICD-10-CM

## 2022-03-11 NOTE — Discharge Instructions (Addendum)
Go to the emergency department for further evaluation.  We recommend you not drive yourself and have some no striae.

## 2022-03-11 NOTE — ED Triage Notes (Signed)
Pt reports midsternal chest pain unaffected by movement that started approximately 1 hour pta. Pt reports took baby aspirin x2. 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   Pt alert, calm, nondiaphoretic.

## 2022-03-11 NOTE — ED Provider Notes (Signed)
[facility]    CSN: [id number] Arrival date & time: 03/11/22  1906      History   Chief Complaint Chief Complaint  Patient presents with   Chest Pain    HPI Tyler Dean is a 36 y.o. male.   States he was woken up from sleep about an hour ago with significant midsternal chest pain and pressure as if someone was sitting on his chest, shortness of breath, radiation of pain to left side of jaw and down left arm.  He is now also having palpitations and his symptoms are causing some anxiety.  He denies diaphoresis, nausea, vomiting, dizziness, new stressors, recent medication changes.  States he is followed by cardiology for hypertension for which she is compliant with his regimen as well as a dilated aortic root which she has serial ultrasounds to monitor.  Has a family history of cardiac issues as well.  So far has not taken anything over-the-counter for symptoms.    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   Generalized anxiety disorder 02/27/2016   Contact dermatitis and other eczema 06/13/2015    Past Surgical History:  Procedure Laterality Date   CYSTOSCOPY N/A 04/27/2013   Procedure: CYSTOSCOPY;  Surgeon: Marissa Nestle, MD;  Location: [facility];  Service: Urology;  Laterality: N/A;   DENTAL SURGERY  05/19/2019   EAR CYST EXCISION N/A 04/27/2013   Procedure: EXCISION OF SEBACEOUS CYST ON  PENIS AND SCROTUM;  Surgeon: Marissa Nestle, MD;  Location: [facility];  Service: Urology;  Laterality: N/A;   PILONIDAL CYST EXCISION N/A 04/16/2013   Procedure: CYST EXCISION PILONIDAL SIMPLE;  Surgeon: Jamesetta So, MD;  Location: [facility];  Service: General;  Laterality: N/A;       Home Medications    Prior to Admission medications   Medication Sig Start Date End Date Taking? Authorizing Provider  buPROPion (WELLBUTRIN XL) 300 MG 24 hr tablet Take 300 mg by mouth daily. 12/24/21   [provider]  losartan (COZAAR) 100 MG tablet Take 100 mg by mouth daily. 08/22/21   [provider]  REPATHA SURECLICK 683 MG/ML SOAJ INJECT 140 MG  SUBCUTANEOUSLY EVERY 14 DAYS Patient taking differently: Inject 140 mg into the skin every 14 (fourteen) days. 12/03/21   Satira Sark, MD    Family History Family History  Problem Relation Age of Onset   Heart disease Mother    Migraines Mother    Cancer Father    Migraines Brother    Heart disease Maternal Grandmother    Migraines Maternal Grandmother    Heart disease Maternal Grandfather    Migraines Maternal Uncle    Migraines Maternal Uncle    Colon cancer Neg Hx        grandmother with colon cancer   Colon polyps Neg Hx     Social History Social History   Tobacco Use   Smoking status: Every Day  Packs/day: 0.50    Years: 10.00    Total pack years: 5.00    Types: Cigarettes   Smokeless tobacco: Never  Vaping Use   Vaping Use: Some days  Substance Use Topics   Alcohol use: Never   Drug use: Never     Allergies   Atorvastatin, Crestor [rosuvastatin], and Pravastatin   Review of Systems Review of Systems Per HPI  Physical Exam Triage Vital Signs ED Triage Vitals  Enc Vitals Group     BP 03/11/22 1919 (!) 169/113     Pulse Rate 03/11/22 1919 (!) 115     Resp 03/11/22 1919 20     Temp 03/11/22 1919 99.2 F (37.3 C)     Temp Source 03/11/22 1919 Oral     SpO2 03/11/22 1919 96 %     Weight --       Height --      Head Circumference --      Peak Flow --      Pain Score 03/11/22 1920 7     Pain Loc --      Pain Edu? --      Excl. in [place]? --    No data found.  Updated Vital Signs BP (!) 169/113 (BP Location: Right Arm)   Pulse (!) 115   Temp 99.2 F (37.3 C) (Oral)   Resp 20   SpO2 96%   Visual Acuity Right Eye Distance:   Left Eye Distance:   Bilateral Distance:    Right Eye Near:   Left Eye Near:    Bilateral Near:     Physical Exam Vitals and nursing note reviewed.  Constitutional:      Appearance: He is well-developed. He is not diaphoretic.  HENT:     Mouth/Throat:     Mouth: Mucous membranes are moist.  Eyes:     Extraocular Movements: Extraocular movements intact.     Conjunctiva/sclera: Conjunctivae normal.  Cardiovascular:     Rate and Rhythm: Tachycardia present.  Pulmonary:     Effort: Pulmonary effort is normal. No respiratory distress.  Musculoskeletal:     Cervical back: Normal range of motion and neck supple.  Skin:    General: Skin is warm and dry.  Neurological:     Mental Status: He is alert and oriented to person, place, and time.     Motor: No weakness.     Gait: Gait normal.  Psychiatric:     Comments: Mildly anxious    Exam abbreviated today as decision was already made to go to the emergency department  UC Treatments / Results  Labs (all labs ordered are listed, but only abnormal results are displayed) Labs Reviewed - No data to display  EKG   Radiology No results found.  Procedures Procedures (including critical care time)  Medications Ordered in UC Medications - No data to display  Initial Impression / Assessment and Plan / UC Course  I have reviewed the triage vital signs and the nursing notes.  Pertinent labs & imaging results that were available during my care of the patient were reviewed by me and considered in my medical decision making (see chart for details).     Hypertensive and tachycardic in triage,  EKG today showing sinus tachycardia at 104 bpm without acute ST or T wave changes but given the concerning symptoms he is experiencing as well as his past history and risk factors recommend further evaluation in the emergency department for which she is readily agreeable.  Has someone with him here today that plans to drive him via private vehicle per his request.  He is hemodynamically stable for transport at this time.  Final Clinical Impressions(s) / UC Diagnoses   Final diagnoses:  Chest pain, unspecified type  Left arm pain  Tachycardia  SOB (shortness of breath)  Elevated blood pressure reading  Dilated aortic root [facility])     Discharge Instructions      Go to the emergency department for further evaluation.  We recommend you not drive yourself and have some no striae.    ED Prescriptions   None    PDMP not reviewed this encounter.   Volney American, [place] 03/11/22 1934

## 2022-03-11 NOTE — ED Notes (Signed)
Patient is being discharged from the Urgent Care and sent to the Emergency Department via POV . Per PA, patient is in need of higher level of care due to acute onset chest pain. Patient is aware and verbalizes understanding of plan of care.  Vitals:   03/11/22 1919  BP: (!) 169/113  Pulse: (!) 115  Resp: 20  Temp: 99.2 F (37.3 C)  SpO2: 96%

## 2022-03-14 DIAGNOSIS — M7731 Calcaneal spur, right foot: Secondary | ICD-10-CM | POA: Diagnosis not present

## 2022-03-14 DIAGNOSIS — M722 Plantar fascial fibromatosis: Secondary | ICD-10-CM | POA: Diagnosis not present

## 2022-03-14 DIAGNOSIS — M79671 Pain in right foot: Secondary | ICD-10-CM | POA: Diagnosis not present

## 2022-03-14 DIAGNOSIS — M7732 Calcaneal spur, left foot: Secondary | ICD-10-CM | POA: Diagnosis not present

## 2022-03-18 DIAGNOSIS — M791 Myalgia, unspecified site: Secondary | ICD-10-CM | POA: Diagnosis not present

## 2022-03-18 DIAGNOSIS — Z6830 Body mass index (BMI) 30.0-30.9, adult: Secondary | ICD-10-CM | POA: Diagnosis not present

## 2022-03-18 DIAGNOSIS — M25521 Pain in right elbow: Secondary | ICD-10-CM | POA: Diagnosis not present

## 2022-03-18 DIAGNOSIS — M25522 Pain in left elbow: Secondary | ICD-10-CM | POA: Diagnosis not present

## 2022-04-09 ENCOUNTER — Telehealth: Payer: Self-pay | Admitting: Cardiology

## 2022-04-09 NOTE — Telephone Encounter (Signed)
Advised that echo's aren't ordered yearly necessarily but based on diagnosis along with symptom update. Advised that it would be determined when he comes for his follow up in May 2024 if an echocardiogram is needed. Verbalized understanding. Say she mis understood.

## 2022-04-09 NOTE — Telephone Encounter (Signed)
Patient stated he wants to be scheduled for his annual Echo test.

## 2022-05-01 ENCOUNTER — Encounter: Payer: Self-pay | Admitting: Allergy & Immunology

## 2022-05-01 ENCOUNTER — Ambulatory Visit (INDEPENDENT_AMBULATORY_CARE_PROVIDER_SITE_OTHER): Payer: BC Managed Care – PPO | Admitting: Allergy & Immunology

## 2022-05-01 ENCOUNTER — Other Ambulatory Visit: Payer: Self-pay

## 2022-05-01 VITALS — BP 124/86 | HR 92 | Temp 97.9°F | Resp 20 | Ht 71.5 in | Wt 227.2 lb

## 2022-05-01 DIAGNOSIS — J310 Chronic rhinitis: Secondary | ICD-10-CM | POA: Diagnosis not present

## 2022-05-01 DIAGNOSIS — M2550 Pain in unspecified joint: Secondary | ICD-10-CM

## 2022-05-01 DIAGNOSIS — J4530 Mild persistent asthma, uncomplicated: Secondary | ICD-10-CM

## 2022-05-01 MED ORDER — PREDNISONE 10 MG PO TABS: ORAL_TABLET | ORAL | 0 refills | Status: DC

## 2022-05-01 NOTE — Patient Instructions (Addendum)
1. Mild persistent asthma, uncomplicated - Lung testing was in the 50% range and did not improve with the albuterol use. - I want to see how you look when you have been on a daily controller medication. - Start the prednisone taper sent in today to kickstart you healing. - Start Trelegy 121mg one puff once daily for one month to see how you do with that. - I want to see whether this changes your spirometry at all at the next visit. - Samples provided to get you through these next four weeks.   2. Chronic rhinitis - Testing today showed: NEGATIVE TO THE ENTIRE PANEL - Copy of test results provided.  - Avoidance measures provided. - Start taking: Allegra (fexofenadine) '180mg'$  tablet once daily and Xhance (fluticasone) 1 spray per nostril twice daily - The nasal steroid may help control inflammation in your nose and keep your sinuses open to help with your symptoms.  - You can use an extra dose of the antihistamine, if needed, for breakthrough symptoms.  - Consider nasal saline rinses 1-2 times daily to remove allergens from the nasal cavities as well as help with mucous clearance (this is especially helpful to do before the nasal sprays are given)  3. Arthralgia - We are going to get some labs so that Rheumatology would be more inclined to see you. - We are getting inflammatory markers as well as a few other things.   4. Return in about 4 weeks (around 05/29/2022).    Please inform [place] any Emergency Department visits, hospitalizations, or changes in symptoms. Call [place]efore going to the ED for breathing or allergy symptoms since we might be able to fit you in for a sick visit. Feel free to contact u[place]anytime with any questions, problems, or concerns.  It was a pleasure to meet you today!  Websites that have reliable patient information: 1. American Academy of Asthma, Allergy, and Immunology: www.aaaai.org 2. Food Allergy Research and Education (FARE): foodallergy.org 3. Mothers of  Asthmatics: http://www.asthmacommunitynetwork.org 4. American College of Allergy, Asthma, and Immunology: www.acaai.org   COVID-19 Vaccine Information can be found at: [email or web address] questions related to vaccine distribution or appointments, please email [email or web address] or call [phone]   We realize that you might be concerned about having an allergic reaction to the COVID19 vaccines. To help with that concern, WE ARE OFFERING THE COVID19 VACCINES IN OUR OFFICE! Ask the front desk for dates!     "Like" [facility] Facebook and Instagram for our latest updates!      A healthy democracy works best when ANew York Life Insuranceparticipate! Make sure you are registered to vote! If you have moved or changed any of your contact information, you will need to get this updated before voting!  In some cases, you MAY be able to register to vote online: [email or web address]       Airborne Adult Perc - 05/01/22 1100     Time Antigen Placed 1030    Allergen Manufacturer Greer    Location Back    Number of Test 59    1. Control-Buffer 50% Glycerol Negative    2. Control-Histamine 1 mg/ml 3+    3. Albumin saline Negative    4. BBangorNegative    5. BGuatemalaNegative    6. Johnson Negative    7. KBenedictBlue Negative    8. Meadow Fescue Negative    9. Perennial Rye Negative    10. Sweet Vernal Negative    11. Timothy Negative  12. Cocklebur Negative    13. Burweed Marshelder Negative    14. Ragweed, short Negative    15. Ragweed, Giant Negative    16. Plantain,  English Negative    17. Lamb's Quarters Negative    18. Sheep Sorrell Negative    19. Rough Pigweed Negative    20. Marsh Elder, Rough Negative    21. Mugwort, Common Negative    22. Ash mix Negative    23. Birch mix Negative    24. Beech American Negative    25. Box, Elder Negative    26. Cedar, red Negative    27. Cottonwood,  [place] Negative    28. Elm mix Negative    29. Hickory Negative    30. Maple mix Negative    31. Oak, [place] mix Negative    32. Pecan Pollen Negative    33. Pine mix Negative    34. Sycamore Eastern Negative    35. Pine Bush, Black Pollen Negative    36. Alternaria alternata Negative    37. Cladosporium Herbarum Negative    38. Aspergillus mix Negative    39. Penicillium mix Negative    40. Bipolaris sorokiniana (Helminthosporium) Negative    41. Drechslera spicifera (Curvularia) Negative    42. Mucor plumbeus Negative    43. Fusarium moniliforme Negative    44. Aureobasidium pullulans (pullulara) Negative    45. Rhizopus oryzae Negative    46. Botrytis cinera Negative    47. Epicoccum nigrum Negative    48. Phoma betae Negative    49. Candida Albicans Negative    50. Trichophyton mentagrophytes Negative    51. Mite, D Farinae  5,000 AU/ml Negative    52. Mite, D Pteronyssinus  5,000 AU/ml Negative    53. Cat Hair 10,000 BAU/ml Negative    54.  Dog Epithelia Negative    55. Mixed Feathers Negative    56. Horse Epithelia Negative    57. Cockroach, German Negative    58. Mouse Negative    59. Tobacco Leaf Negative             Intradermal - 05/01/22 1100     Time Antigen Placed 1045    Allergen Manufacturer Greer    Location Arm    Number of Test 15    Control Negative    [place] Negative    Johnson Negative    7 Grass Negative    Ragweed mix Negative    Weed mix Negative    Tree mix Negative    Mold 1 Negative    Mold 2 Negative    Mold 3 Negative    Mold 4 Negative    Cat Negative    Dog Negative    Cockroach Negative    Mite mix Negative

## 2022-05-01 NOTE — Progress Notes (Signed)
NEW PATIENT  Date of Service/Encounter:  05/01/22  Consult requested by: Lavella Lemons, PA   Assessment:   Mild persistent asthma, uncomplicated  Chronic rhinitis - with negative testing  Arthralgia  Exposure to moldy environment in his workplace  Brain fog - followed by Dr. Jaynee Eagles  Plan/Recommendations:   1. Mild persistent asthma, uncomplicated - Lung testing was in the 50% range and did not improve with the albuterol use. - I want to see how you look when you have been on a daily controller medication. - Start the prednisone taper sent in today to kickstart you healing. - Start Trelegy 133mg one puff once daily for one month to see how you do with that. - I want to see whether this changes your spirometry at all at the next visit. - Samples provided to get you through these next four weeks.   2. Chronic rhinitis - Testing today showed: NEGATIVE TO THE ENTIRE PANEL - Copy of test results provided.  - Avoidance measures provided. - Start taking: Allegra (fexofenadine) 1875mtablet once daily and Xhance (fluticasone) 1 spray per nostril twice daily - The nasal steroid may help control inflammation in your nose and keep your sinuses open to help with your symptoms.  - You can use an extra dose of the antihistamine, if needed, for breakthrough symptoms.  - Consider nasal saline rinses 1-2 times daily to remove allergens from the nasal cavities as well as help with mucous clearance (this is especially helpful to do before the nasal sprays are given)  3. Arthralgia - We are going to get some labs so that Rheumatology would be more inclined to see you. - We are getting inflammatory markers as well as a few other things.   4. Return in about 4 weeks (around 05/29/2022).    This note in its entirety was forwarded to the Provider who requested this consultation.  Subjective:   Tyler Dean a [age].o. male presenting today for evaluation of  Chief Complaint   Patient presents with   Other    Brain fog, joint pains,blurry vision,dry eyes- possible exposure to mold     Tyler KAZMIERCZAKas a history of the following: Patient Active Problem List   Diagnosis Date Noted   Rhabdomyolysis 12/28/2021   Myalgia due to statin 01/14/2021   Chronic migraine without aura without status migrainosus, not intractable 05/24/2019   Morning headache 05/24/2019   Elevated LFTs 08/18/2018   Diverticulitis of colon 07/26/2017   Acute maxillary sinusitis 03/24/2017   Essential hypertension 07/21/2016   Abdominal pain, epigastric 07/08/2016   Mixed hyperlipidemia 07/08/2016   Attention deficit disorder with hyperactivity 04/04/2016   Generalized anxiety disorder 02/27/2016   Contact dermatitis and other eczema 06/13/2015    History obtained from: chart review and patient.  Tyler Bushmanas referred by BoLavella LemonsPA.     Tyler Dean a [age].o. male presenting for an evaluation of   .  There was an HVAC problem a while back at work. They fixed the problem with the HVAC system at work. It has been ongoing since 2020 or 2021. He knows that they have done things to fix it, but he thinks that there eis something "lingering" in there. There is a smell in the facility that causes some tightness in his forehead and pressure in his eyes. He also reports brain fog. He works in person. Once it is triggered, he has symptoms for the rest of the  day. This is in his office, so he can not really avoid it. There are also some LED lights that might make things worse. He reps that he has a lot of ocular dryness. He has one surgery to remove some of the cyst tissue. This seems to get worse in that area at work. He is fine on the weekends and when he goes on vacations.   He has someone coming to steam clean the carpeting coming this week. He has a HEPA filter in his office. He also has a dehumidifer that he empties 2-3 times per day. His first COVID was one year after this all  started.   He reports a lot of joint pain over the last 2-3 months. He thinks that this might be related to mold toxicity. He has some some Theme park manager. He is going to see a Rheumatologist (appointment not made). He is also going to see an Endocrinologist for some "thyroid issues". ANA was negative on 11/9 per the patient (portal through the PA at work). He does not think that he has had a CRP or ESR.    Asthma/Respiratory Symptom History: He did have an inhaler and has not used it in 6 months. He does not think that he has asthma. He never received prednisone for his breathing.  Allergic Rhinitis Symptom History: He did have a course of prednisone for plantar fasciitis. This did help with his sinus issues and he felt normal on the prednisone.  He has never been tested in the past. In general, he does not have a lot of allergy symptoms aside from the sinus pain and congestion. He did end up going to see [facility]. He had an MRI done for the "brain dog". He sees Dr. Jaynee Eagles. MRI in May 2023 was essentially normal.    Otherwise, there is no history of other atopic diseases, including drug allergies, stinging insect allergies, eczema, urticaria, or contact dermatitis. There is no significant infectious history. Vaccinations are up to date.    Past Medical History: Patient Active Problem List   Diagnosis Date Noted   Rhabdomyolysis 12/28/2021   Myalgia due to statin 01/14/2021   Chronic migraine without aura without status migrainosus, not intractable 05/24/2019   Morning headache 05/24/2019   Elevated LFTs 08/18/2018   Diverticulitis of colon 07/26/2017   Acute maxillary sinusitis 03/24/2017   Essential hypertension 07/21/2016   Abdominal pain, epigastric 07/08/2016   Mixed hyperlipidemia 07/08/2016   Attention deficit disorder with hyperactivity 04/04/2016   Generalized anxiety disorder 02/27/2016   Contact dermatitis and other eczema 06/13/2015    Medication List:  Allergies  as of 05/01/2022       Reactions   Atorvastatin    myalgias   Crestor [rosuvastatin]    Myalgia   Pravastatin    myalgias        Medication List        Accurate as of May 01, 2022 11:59 PM. If you have any questions, ask your nurse or doctor.          buPROPion 300 MG 24 hr tablet Commonly known as: WELLBUTRIN XL Take 300 mg by mouth daily.   ibuprofen 800 MG tablet Commonly known as: ADVIL Take 800 mg by mouth 3 (three) times daily.   losartan 100 MG tablet Commonly known as: COZAAR Take 100 mg by mouth daily.   predniSONE 10 MG tablet Commonly known as: DELTASONE Take 3 tabs (33m) twice daily for 3 days, then 2 tabs (29m twice  daily for 3 days, then 1 tab (52m) twice daily for 3 days, then STOP. Started by: JValentina Shaggy MD   Repatha SureClick 1480MG/ML Soaj Generic drug: Evolocumab INJECT 140 MG  SUBCUTANEOUSLY EVERY 14 DAYS What changed: See the new instructions.        Birth History: non-contributory  Developmental History: non-contributory  Past Surgical History: Past Surgical History:  Procedure Laterality Date   CYSTOSCOPY N/A 04/27/2013   Procedure: CYSTOSCOPY;  Surgeon: MMarissa Nestle MD;  Location: [facility];  Service: Urology;  Laterality: N/A;   DENTAL SURGERY  05/19/2019   EAR CYST EXCISION N/A 04/27/2013   Procedure: EXCISION OF SEBACEOUS CYST ON PENIS AND SCROTUM;  Surgeon: MMarissa Nestle MD;  Location: [facility];  Service: Urology;  Laterality: N/A;   PILONIDAL CYST EXCISION N/A 04/16/2013   Procedure: CYST EXCISION PILONIDAL SIMPLE;  Surgeon: MJamesetta So MD;  Location: [facility];  Service: General;  Laterality: N/A;     Family History: Family History  Problem Relation Age of Onset   Heart disease Mother    Migraines Mother    Cancer Father    Migraines Brother    Heart disease Maternal Grandmother    Migraines Maternal Grandmother    Heart disease Maternal Grandfather    Migraines Maternal Uncle     Migraines Maternal Uncle    Colon cancer Neg Hx        grandmother with colon cancer   Colon polyps Neg Hx      Social History: KTristianlives at home with his family.  He lives in a house that is 36years old.  There is carpeting throughout the home.  They have electric heating and central cooling.  There are dogs inside of the home and cats outside of the home.  There are no dust mite covers on the bedding.  He does vape.  Currently works as a sEnvironmental education officerfor the past 8 years.  He is exposed to fumes, chemicals, and dust.  He does have a HEPA filter. 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    Review of Systems  Constitutional:  Positive for malaise/fatigue. Negative for fever and weight loss.  HENT:  Positive for congestion. Negative for ear discharge and ear pain.   Eyes:  Negative for pain, discharge and redness.  Respiratory:  Positive for cough. Negative for sputum production, shortness of breath and wheezing.   Cardiovascular: Negative.  Negative for chest pain and palpitations.  Gastrointestinal:  Negative for abdominal pain, heartburn, nausea and vomiting.  Musculoskeletal:  Positive for joint pain.  Skin: Negative.  Negative for itching and rash.  Neurological:  Negative for dizziness and headaches.  Endo/Heme/Allergies:  Negative for environmental allergies. Does not bruise/bleed easily.       Objective:   Blood pressure 124/86, pulse 92, temperature 97.9 F (36.6 C), resp. rate 20, height 5' 11.5" (1.816 m), weight 227 lb 4 oz (103.1 kg), SpO2 98 %. Body mass index is 31.25 kg/m.     Physical Exam Vitals reviewed.  Constitutional:      Appearance: He is well-developed.     Comments: Pleasant-appearing male.  HENT:     Head: Normocephalic and atraumatic.     Right Ear: Tympanic membrane, ear canal and external ear normal. No drainage, swelling or tenderness. Tympanic membrane is not injected, scarred, erythematous, retracted or bulging.     Left Ear:  Tympanic membrane, ear canal and external ear normal. No drainage,  swelling or tenderness. Tympanic membrane is not injected, scarred, erythematous, retracted or bulging.     Nose: Rhinorrhea present. No nasal deformity, septal deviation or mucosal edema.     Right Turbinates: Enlarged and swollen.     Left Turbinates: Enlarged and swollen.     Right Sinus: No maxillary sinus tenderness or frontal sinus tenderness.     Left Sinus: No maxillary sinus tenderness or frontal sinus tenderness.     Mouth/Throat:     Mouth: Mucous membranes are not pale and not dry.     Pharynx: Uvula midline.  Eyes:     General:        Right eye: No discharge.        Left eye: No discharge.     Conjunctiva/sclera: Conjunctivae normal.     Right eye: Right conjunctiva is not injected. No chemosis.    Left eye: Left conjunctiva is not injected. No chemosis.    Pupils: Pupils are equal, round, and reactive to light.  Cardiovascular:     Rate and Rhythm: Normal rate and regular rhythm.     Heart sounds: Normal heart sounds.  Pulmonary:     Effort: Pulmonary effort is normal. No tachypnea, accessory muscle usage or respiratory distress.     Breath sounds: Normal breath sounds. No wheezing, rhonchi or rales.  Chest:     Chest wall: No tenderness.  Abdominal:     Tenderness: There is no abdominal tenderness. There is no guarding or rebound.  Lymphadenopathy:     Head:     Right side of head: No submandibular, tonsillar or occipital adenopathy.     Left side of head: No submandibular, tonsillar or occipital adenopathy.     Cervical: No cervical adenopathy.  Skin:    Coloration: Skin is not pale.     Findings: No abrasion, erythema, petechiae or rash. Rash is not papular, urticarial or vesicular.  Neurological:     Mental Status: He is alert.  Psychiatric:        Behavior: Behavior is cooperative.      Diagnostic studies:    Spirometry: results abnormal (FEV1: 2.60/58%, FVC: 3.34/61%, FEV1/FVC: 78%).     Spirometry consistent with possible restrictive disease. Albuterol four puffs via MDI treatment given in clinic with significant improvement in FVC per ATS criteria. However, his FEV1 decreased.   Allergy Studies:     Airborne Adult Perc - 05/01/22 1100     Time Antigen Placed 1030    Allergen Manufacturer Greer    Location Back    Number of Test 59    1. Control-Buffer 50% Glycerol Negative    2. Control-Histamine 1 mg/ml 3+    3. Albumin saline Negative    4. Gorst Negative    5. [place] Negative    6. Johnson Negative    7. Snover Blue Negative    8. Meadow Fescue Negative    9. Perennial Rye Negative    10. Sweet Vernal Negative    11. Timothy Negative    12. Cocklebur Negative    13. Burweed Marshelder Negative    14. Ragweed, short Negative    15. Ragweed, Giant Negative    16. Plantain,  English Negative    17. Lamb's Quarters Negative    18. Sheep Sorrell Negative    19. Rough Pigweed Negative    20. Marsh Elder, Rough Negative    21. Mugwort, Common Negative    22. Ash mix Negative    23. Wendee Copp mix  Negative    24. Beech American Negative    25. Box, Elder Negative    26. Cedar, red Negative    27. Cottonwood, [place] Negative    28. Elm mix Negative    29. Hickory Negative    30. Maple mix Negative    31. Oak, [place] mix Negative    32. Pecan Pollen Negative    33. Pine mix Negative    34. Sycamore Eastern Negative    35. [place], Black Pollen Negative    36. Alternaria alternata Negative    37. Cladosporium Herbarum Negative    38. Aspergillus mix Negative    39. Penicillium mix Negative    40. Bipolaris sorokiniana (Helminthosporium) Negative    41. Drechslera spicifera (Curvularia) Negative    42. Mucor plumbeus Negative    43. Fusarium moniliforme Negative    44. Aureobasidium pullulans (pullulara) Negative    45. Rhizopus oryzae Negative    46. Botrytis cinera Negative    47. Epicoccum nigrum Negative    48. Phoma betae Negative    49.  Candida Albicans Negative    50. Trichophyton mentagrophytes Negative    51. Mite, D Farinae  5,000 AU/ml Negative    52. Mite, D Pteronyssinus  5,000 AU/ml Negative    53. Cat Hair 10,000 BAU/ml Negative    54.  Dog Epithelia Negative    55. Mixed Feathers Negative    56. Horse Epithelia Negative    57. Cockroach, German Negative    58. Mouse Negative    59. Tobacco Leaf Negative             Intradermal - 05/01/22 1100     Time Antigen Placed 1045    Allergen Manufacturer Greer    Location Arm    Number of Test 15    Control Negative    Guatemala Negative    Johnson Negative    7 Grass Negative    Ragweed mix Negative    Weed mix Negative    Tree mix Negative    Mold 1 Negative    Mold 2 Negative    Mold 3 Negative    Mold 4 Negative    Cat Negative    Dog Negative    Cockroach Negative    Mite mix Negative             Allergy testing results were read and interpreted by myself, documented by clinical staff.         Salvatore Marvel, MD [facility]

## 2022-05-03 ENCOUNTER — Encounter: Payer: Self-pay | Admitting: Allergy & Immunology

## 2022-05-04 LAB — ALLERGEN PROFILE, MOLD
Aureobasidi Pullulans IgE: <0.1 kU/L
Candida Albicans IgE: <0.1 kU/L
M009-IgE Fusarium proliferatum: <0.1 kU/L
M014-IgE Epicoccum purpur: <0.1 kU/L
Mucor Racemosus IgE: <0.1 kU/L
Phoma Betae IgE: <0.1 kU/L
Setomelanomma Rostrat: <0.1 kU/L
Stemphylium Herbarum IgE: <0.1 kU/L

## 2022-05-04 LAB — ALLERGENS W/COMP RFLX AREA 2
Alternaria Alternata IgE: <0.1 kU/L
Aspergillus Fumigatus IgE: <0.1 kU/L
Bermuda Grass IgE: <0.1 kU/L
Cedar, Mountain IgE: <0.1 kU/L
Cladosporium Herbarum IgE: <0.1 kU/L
Cockroach, German IgE: <0.1 kU/L
Common Silver Birch IgE: <0.1 kU/L
Cottonwood IgE: <0.1 kU/L
D Farinae IgE: <0.1 kU/L
D Pteronyssinus IgE: <0.1 kU/L
E001-IgE Cat Dander: <0.1 kU/L
E005-IgE Dog Dander: <0.1 kU/L
Elm, American IgE: <0.1 kU/L
IgE (Immunoglobulin E), Serum: 37 IU/mL (ref 6–495)
Johnson Grass IgE: <0.1 kU/L
Maple/Box Elder IgE: <0.1 kU/L
Mouse Urine IgE: <0.1 kU/L
Oak, White IgE: <0.1 kU/L
Pecan, Hickory IgE: <0.1 kU/L
Penicillium Chrysogen IgE: <0.1 kU/L
Pigweed, Rough IgE: <0.1 kU/L
Ragweed, Short IgE: <0.1 kU/L
Sheep Sorrel IgE Qn: <0.1 kU/L
Timothy Grass IgE: <0.1 kU/L
White Mulberry IgE: <0.1 kU/L

## 2022-05-04 LAB — SEDIMENTATION RATE: Sed Rate: 3 mm/hr (ref 0–15)

## 2022-05-04 LAB — RHEUMATOID FACTOR: Rheumatoid fact SerPl-aCnc: <10 IU/mL (ref <14.0)

## 2022-05-04 LAB — TRYPTASE: Tryptase: 9.5 ug/L (ref 2.2–13.2)

## 2022-05-04 LAB — C-REACTIVE PROTEIN: CRP: 4 mg/L (ref 0–10)

## 2022-05-14 DIAGNOSIS — H11442 Conjunctival cysts, left eye: Secondary | ICD-10-CM | POA: Diagnosis not present

## 2022-06-04 ENCOUNTER — Other Ambulatory Visit (HOSPITAL_COMMUNITY): Payer: Self-pay

## 2022-06-18 ENCOUNTER — Encounter: Payer: Self-pay | Admitting: Allergy & Immunology

## 2022-06-18 DIAGNOSIS — Z6830 Body mass index (BMI) 30.0-30.9, adult: Secondary | ICD-10-CM | POA: Diagnosis not present

## 2022-06-18 DIAGNOSIS — R7989 Other specified abnormal findings of blood chemistry: Secondary | ICD-10-CM | POA: Diagnosis not present

## 2022-06-18 DIAGNOSIS — Z77120 Contact with and (suspected) exposure to mold (toxic): Secondary | ICD-10-CM | POA: Diagnosis not present

## 2022-06-26 ENCOUNTER — Encounter: Payer: Self-pay | Admitting: Nurse Practitioner

## 2022-06-26 ENCOUNTER — Ambulatory Visit: Payer: BC Managed Care – PPO | Admitting: Allergy & Immunology

## 2022-07-02 ENCOUNTER — Emergency Department (HOSPITAL_COMMUNITY)
Admission: EM | Admit: 2022-07-02 | Discharge: 2022-07-02 | Disposition: A | Discharge status: Home / Self Care | Payer: BC Managed Care – PPO | Attending: Emergency Medicine | Admitting: Emergency Medicine

## 2022-07-02 ENCOUNTER — Emergency Department (HOSPITAL_COMMUNITY): Payer: BC Managed Care – PPO

## 2022-07-02 ENCOUNTER — Encounter (HOSPITAL_COMMUNITY): Payer: Self-pay | Admitting: *Deleted

## 2022-07-02 ENCOUNTER — Other Ambulatory Visit: Payer: Self-pay

## 2022-07-02 DIAGNOSIS — R110 Nausea: Secondary | ICD-10-CM | POA: Diagnosis not present

## 2022-07-02 DIAGNOSIS — R42 Dizziness and giddiness: Secondary | ICD-10-CM | POA: Diagnosis not present

## 2022-07-02 DIAGNOSIS — I10 Essential (primary) hypertension: Secondary | ICD-10-CM | POA: Diagnosis not present

## 2022-07-02 DIAGNOSIS — Z79899 Other long term (current) drug therapy: Secondary | ICD-10-CM | POA: Diagnosis not present

## 2022-07-02 DIAGNOSIS — R0789 Other chest pain: Secondary | ICD-10-CM | POA: Diagnosis not present

## 2022-07-02 DIAGNOSIS — R079 Chest pain, unspecified: Secondary | ICD-10-CM | POA: Diagnosis not present

## 2022-07-02 DIAGNOSIS — R0600 Dyspnea, unspecified: Secondary | ICD-10-CM | POA: Diagnosis not present

## 2022-07-02 DIAGNOSIS — I774 Celiac artery compression syndrome: Secondary | ICD-10-CM | POA: Diagnosis not present

## 2022-07-02 LAB — LIPASE, BLOOD: Lipase: 30 U/L (ref 11–51)

## 2022-07-02 LAB — CBC
HCT: 44.4 % (ref 39.0–52.0)
Hemoglobin: 15.2 g/dL (ref 13.0–17.0)
MCH: 31.7 pg (ref 26.0–34.0)
MCHC: 34.2 g/dL (ref 30.0–36.0)
MCV: 92.5 fL (ref 80.0–100.0)
Platelets: 319 10*3/uL (ref 150–400)
RBC: 4.8 MIL/uL (ref 4.22–5.81)
RDW: 12.4 % (ref 11.5–15.5)
WBC: 9.3 10*3/uL (ref 4.0–10.5)
nRBC: 0 % (ref 0.0–0.2)

## 2022-07-02 LAB — CBG MONITORING, ED: Glucose-Capillary: 109 mg/dL — ABNORMAL HIGH (ref 70–99)

## 2022-07-02 LAB — COMPREHENSIVE METABOLIC PANEL
ALT: 50 U/L — ABNORMAL HIGH (ref 0–44)
AST: 24 U/L (ref 15–41)
Albumin: 4.5 g/dL (ref 3.5–5.0)
Alkaline Phosphatase: 51 U/L (ref 38–126)
Anion gap: 9 (ref 5–15)
BUN: 17 mg/dL (ref 6–20)
CO2: 26 mmol/L (ref 22–32)
Calcium: 9.3 mg/dL (ref 8.9–10.3)
Chloride: 103 mmol/L (ref 98–111)
Creatinine, Ser: 0.82 mg/dL (ref 0.61–1.24)
GFR, Estimated: >60 mL/min (ref >60)
Glucose, Bld: 99 mg/dL (ref 70–99)
Potassium: 3.8 mmol/L (ref 3.5–5.1)
Sodium: 138 mmol/L (ref 135–145)
Total Bilirubin: 0.6 mg/dL (ref 0.3–1.2)
Total Protein: 7.5 g/dL (ref 6.5–8.1)

## 2022-07-02 LAB — BRAIN NATRIURETIC PEPTIDE: B Natriuretic Peptide: 15 pg/mL (ref 0.0–100.0)

## 2022-07-02 LAB — MAGNESIUM: Magnesium: 1.9 mg/dL (ref 1.7–2.4)

## 2022-07-02 LAB — TROPONIN I (HIGH SENSITIVITY)
Troponin I (High Sensitivity): <2 ng/L (ref <18)
Troponin I (High Sensitivity): <2 ng/L (ref <18)

## 2022-07-02 MED ORDER — IOHEXOL 350 MG/ML SOLN
100.0000 mL | Freq: Once | INTRAVENOUS | Status: AC | PRN
  Administered 2022-07-02: 100 mL via INTRAVENOUS

## 2022-07-02 MED ORDER — SUCRALFATE 1 G PO TABS: 1.0000 g | ORAL_TABLET | Freq: Three times a day (TID) | ORAL | 0 refills | Status: DC

## 2022-07-02 MED ORDER — FAMOTIDINE 20 MG PO TABS: 20.0000 mg | ORAL_TABLET | Freq: Two times a day (BID) | ORAL | 0 refills | Status: DC

## 2022-07-02 MED ORDER — ASPIRIN 81 MG PO CHEW: 324.0000 mg | CHEWABLE_TABLET | Freq: Once | ORAL | Status: DC

## 2022-07-02 NOTE — ED Provider Notes (Signed)
[facility] Provider Note   CSN: [id number] Arrival date & time: 07/02/22  0957     History  Chief Complaint  Patient presents with   Chest Pain    Tyler Dean is a 37 y.o. male.  HPI Patient with a family history of cardiac disease, personal history of dilated aortic root presents with chest pain, upper thoracic back pain, bilateral arm paresthesia.  Onset was overnight, within the past 10 hours.  Since that time he has had generalized discomfort in the after mentioned areas as well as metallic taste in his mouth, nausea, mild dyspnea.  No clear alleviating, exacerbating or even precipitating factors.     Home Medications Prior to Admission medications   Medication Sig Start Date End Date Taking? Authorizing Provider  famotidine (PEPCID) 20 MG tablet Take 1 tablet (20 mg total) by mouth 2 (two) times daily. Take one tablet twice daily for two days 07/02/22  Yes Carmin Muskrat, MD  losartan (COZAAR) 100 MG tablet Take 100 mg by mouth daily. 08/22/21  Yes [provider]  REPATHA SURECLICK [id number] MG/ML SOAJ INJECT 140 MG  SUBCUTANEOUSLY EVERY 14 DAYS Patient taking differently: Inject 140 mg into the skin every 14 (fourteen) days. 12/03/21  Yes Satira Sark, MD  sucralfate (CARAFATE) 1 g tablet Take 1 tablet (1 g total) by mouth 4 (four) times daily -  with meals and at bedtime. 07/02/22  Yes Carmin Muskrat, MD  predniSONE (DELTASONE) 10 MG tablet Take 3 tabs ('30mg'$ ) twice daily for 3 days, then 2 tabs ('20mg'$ ) twice daily for 3 days, then 1 tab ('10mg'$ ) twice daily for 3 days, then STOP. Patient not taking: Reported on 07/02/2022 05/01/22   Valentina Shaggy, MD      Allergies    Atorvastatin, Crestor [rosuvastatin], and Pravastatin    Review of Systems   Review of Systems  All other systems reviewed and are negative.   Physical Exam Updated Vital Signs BP (!) 153/104   Pulse 81   Resp 18   Ht 6' (1.829 m)   Wt 102.1  kg   SpO2 98%   BMI 30.52 kg/m  Physical Exam Vitals and nursing note reviewed.  Constitutional:      General: He is not in acute distress.    Appearance: He is well-developed.  HENT:     Head: Normocephalic and atraumatic.  Eyes:     Conjunctiva/sclera: Conjunctivae normal.  Cardiovascular:     Rate and Rhythm: Normal rate and regular rhythm.     Comments: Pulses symmetric upper extremities Pulmonary:     Effort: Pulmonary effort is normal. No respiratory distress.     Breath sounds: No stridor.  Abdominal:     General: There is no distension.  Skin:    General: Skin is warm and dry.  Neurological:     Mental Status: He is alert and oriented to person, place, and time.     ED Results / Procedures / Treatments   Labs (all labs ordered are listed, but only abnormal results are displayed) Labs Reviewed  COMPREHENSIVE METABOLIC PANEL - Abnormal; Notable for the following components:      Result Value   ALT 50 (*)    All other components within normal limits  CBG MONITORING, ED - Abnormal; Notable for the following components:   Glucose-Capillary 109 (*)    All other components within normal limits  CBC  MAGNESIUM  LIPASE, BLOOD  BRAIN NATRIURETIC PEPTIDE  TROPONIN I (HIGH SENSITIVITY)  TROPONIN I (HIGH SENSITIVITY)    EKG EKG Interpretation  Date/Time:  Tuesday July 02 2022 10:08:38 EST Ventricular Rate:  85 PR Interval:  119 QRS Duration: 82 QT Interval:  369 QTC Calculation: 439 R Axis:   25 Text Interpretation: Sinus rhythm Borderline short PR interval Baseline wander in lead(s) V2 Abnormal ECG Confirmed by Carmin Muskrat [phone]) on 07/02/2022 11:39:22 AM  Radiology CT Angio Chest/Abd/Pel for Dissection W and/or Wo Contrast  Result Date: 07/02/2022 CLINICAL DATA:  Acute aortic syndrome. Suspected dilated aortic root. Chest pain and back pain. Upper extremity paresthesias. Symptoms began yesterday. EXAM: CT ANGIOGRAPHY CHEST, ABDOMEN AND PELVIS TECHNIQUE:  Non-contrast CT of the chest was initially obtained. Multidetector CT imaging through the chest, abdomen and pelvis was performed using the standard protocol during bolus administration of intravenous contrast. Multiplanar reconstructed images and MIPs were obtained and reviewed to evaluate the vascular anatomy. RADIATION DOSE REDUCTION: This exam was performed according to the departmental dose-optimization program which includes automated exposure control, adjustment of the mA and/or kV according to patient size and/or use of iterative reconstruction technique. CONTRAST:  195m OMNIPAQUE IOHEXOL 350 MG/ML SOLN COMPARISON:  Chest CT 01/13/2021 FINDINGS: CTA CHEST FINDINGS Cardiovascular: Heart size is normal. No coronary artery calcification or aortic atherosclerotic calcification. No aortic dilatation. No sign of dissection. Pulmonary arteries appear normal as seen. Mediastinum/Nodes: No mass or adenopathy. Lungs/Pleura: No pleural effusion. The lungs are clear. No infiltrate, collapse, mass or nodule. Musculoskeletal: Normal Review of the MIP images confirms the above findings. CTA ABDOMEN AND PELVIS FINDINGS VASCULAR Aorta: No aneurysm or dissection. Mild intimal thickening of the distal infrarenal aorta. No calcified plaque. Celiac: Proximal celiac stenosis with poststenotic dilatation. SMA: Normal Renals: Normal IMA: Normal Inflow: Normal Veins: Normal Review of the MIP images confirms the above findings. NON-VASCULAR Hepatobiliary: Normal Pancreas: Normal Spleen: Normal Adrenals/Urinary Tract: Adrenal glands are normal. Kidneys are normal. Insignificant small cysts at both lower poles. No follow-up recommended. Bladder is normal. Stomach/Bowel: Normal except for mild diverticulosis of the sigmoid colon without evidence of diverticulitis. Lymphatic: Normal.  No adenopathy. Reproductive: Normal Other: No free fluid or air. Musculoskeletal: Normal Review of the MIP images confirms the above findings. IMPRESSION:  1. No acute chest finding. Thoracic aorta including the aortic root appear normal. 2. No evidence of aortic aneurysm or dissection. Mild intimal thickening of the distal infrarenal abdominal aorta. Mild atherosclerosis of the proximal common iliac arteries without stenosis. 3. Proximal celiac stenosis with poststenotic dilatation. 4. No acute abdominal or pelvic organ finding. 5. Mild diverticulosis of the sigmoid colon without evidence of diverticulitis. Electronically Signed   By: MNelson ChimesM.D.   On: 07/02/2022 11:54   DG Chest 2 View  Result Date: 07/02/2022 CLINICAL DATA:  Chest pain, mild dizziness. EXAM: CHEST - 2 VIEW COMPARISON:  01/13/2021. FINDINGS: Clear lungs. Normal heart size and mediastinal contours. No pleural effusion or pneumothorax. Visualized bones and upper abdomen are unremarkable. IMPRESSION: No evidence of acute cardiopulmonary disease. Electronically Signed   By: WEmmit AlexandersM.D.   On: 07/02/2022 10:48    Procedures Procedures    Medications Ordered in ED Medications  aspirin chewable tablet 324 mg (324 mg Oral Not Given 07/02/22 1029)  iohexol (OMNIPAQUE) 350 MG/ML injection 100 mL (100 mLs Intravenous Contrast Given 07/02/22 1125)    ED Course/ Medical Decision Making/ A&P  Medical Decision Making Patient with risk profile including hypertension, hyperlipidemia as well as family strongly history presents with chest pain, upper thoracic pain.  Differential includes atypical ACS, aortic disruption, pneumonia, mediastinitis, pneumonia.  Patient placed on continuous cardiac monitoring, pulse oximetry, labs sent.   Cardiac 85 sinus normal Pulse ox 99% room air normal   Amount and/or Complexity of Data Reviewed External Data Reviewed: notes. Labs: ordered. Decision-making details documented in ED Course. Radiology: ordered and independent interpretation performed. Decision-making details documented in ED Course. ECG/medicine tests:  ordered and independent interpretation performed. Decision-making details documented in ED Course.  Risk OTC drugs. Prescription drug management. Decision regarding hospitalization.   12:44 PM Patient awake, alert, in no distress.  On monitor he has similar vital signs, he has no ongoing complaints, no evidence for endorgan deficit, aortic disruption, ACS, though hospitalization was a consideration, patient is appropriate for close outpatient follow-up to which he is amenable.        Final Clinical Impression(s) / ED Diagnoses Final diagnoses:  Atypical chest pain    Rx / DC Orders ED Discharge Orders          Ordered    famotidine (PEPCID) 20 MG tablet  2 times daily        07/02/22 1244    sucralfate (CARAFATE) 1 g tablet  3 times daily with meals & bedtime       Note to Pharmacy: Take for one week   07/02/22 1244              Carmin Muskrat, MD 07/02/22 1244

## 2022-07-02 NOTE — ED Notes (Signed)
Patient taken asa this morning.

## 2022-07-02 NOTE — ED Triage Notes (Signed)
Pt is here for central and left sided chest pain which began around 5pm yesterday.  Pt also reports shooting pain into neck and back as well as an acid taste in his mouth.  Pt also reports numbness in his hands and arms.  Pt reports that he is high risk (mother has had MI and brother has a had stroke and he has a "dilated aortic root")

## 2022-07-02 NOTE — ED Notes (Signed)
Patient transported to CT 

## 2022-07-02 NOTE — Discharge Instructions (Signed)
As discussed, your evaluation today has been largely reassuring.  But, it is important that you monitor your condition carefully, and do not hesitate to return to the ED if you develop new, or concerning changes in your condition. ? ?Otherwise, please follow-up with your physician for appropriate ongoing care. ? ?

## 2022-07-14 ENCOUNTER — Other Ambulatory Visit: Payer: Self-pay | Admitting: Cardiology

## 2022-07-16 ENCOUNTER — Encounter: Payer: Self-pay | Admitting: Nurse Practitioner

## 2022-07-16 DIAGNOSIS — M791 Myalgia, unspecified site: Secondary | ICD-10-CM | POA: Diagnosis not present

## 2022-07-16 DIAGNOSIS — R4189 Other symptoms and signs involving cognitive functions and awareness: Secondary | ICD-10-CM | POA: Diagnosis not present

## 2022-07-16 DIAGNOSIS — M25522 Pain in left elbow: Secondary | ICD-10-CM | POA: Diagnosis not present

## 2022-07-16 DIAGNOSIS — I10 Essential (primary) hypertension: Secondary | ICD-10-CM | POA: Diagnosis not present

## 2022-07-21 ENCOUNTER — Ambulatory Visit
Admission: EM | Admit: 2022-07-21 | Discharge: 2022-07-21 | Disposition: A | Discharge status: Home / Self Care | Payer: BC Managed Care – PPO | Attending: Family Medicine | Admitting: Family Medicine

## 2022-07-21 DIAGNOSIS — M79602 Pain in left arm: Secondary | ICD-10-CM | POA: Diagnosis not present

## 2022-07-21 DIAGNOSIS — M79601 Pain in right arm: Secondary | ICD-10-CM

## 2022-07-21 DIAGNOSIS — M791 Myalgia, unspecified site: Secondary | ICD-10-CM

## 2022-07-21 LAB — POCT URINALYSIS DIP (MANUAL ENTRY)
Bilirubin, UA: NEGATIVE
Blood, UA: NEGATIVE
Glucose, UA: NEGATIVE mg/dL
Ketones, POC UA: NEGATIVE mg/dL
Leukocytes, UA: NEGATIVE
Nitrite, UA: NEGATIVE
Protein Ur, POC: NEGATIVE mg/dL
Spec Grav, UA: 1.02 (ref 1.010–1.025)
Urobilinogen, UA: 0.2 E.U./dL
pH, UA: 7 (ref 5.0–8.0)

## 2022-07-21 NOTE — ED Triage Notes (Signed)
Pt reports he has rhabdo that is causing his arms to hurt and not be able to move it/ numbness x 6 days. Took flexeril and prednisone but no relief. Pt wants a blood test. Pt is having some urine retention.

## 2022-07-21 NOTE — ED Provider Notes (Signed)
[facility]    CSN: [id number] Arrival date & time: 07/21/22  1419      History   Chief Complaint No chief complaint on file.   HPI Tyler Dean is a 37 y.o. male.   Patient presenting today with bilateral arm pain, stiffness, soreness that started this morning.  Denies swelling, discoloration, decreased range of motion, rashes or lesions.  Does state that he lifted something fairly heavy yesterday but otherwise no injuries or excessive exertion.  Trying some muscle relaxers that he had at home from a previous injury with no relief so far.  States he has a history of rhabdomyolysis and this feels exactly like that so he is certain that he has it.  Denies dark urine, diffuse bodyaches, recent injury or immobilization, excessive exercise which was found to be the cause of his known case of rhabdomyolysis.    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   Generalized anxiety disorder 02/27/2016   Contact dermatitis and other eczema 06/13/2015    Past Surgical History:  Procedure Laterality Date   CYSTOSCOPY N/A 04/27/2013   Procedure: CYSTOSCOPY;  Surgeon: Marissa Nestle, MD;  Location: [facility];  Service: Urology;  Laterality: N/A;   DENTAL SURGERY  05/19/2019   EAR CYST EXCISION N/A 04/27/2013   Procedure: EXCISION OF SEBACEOUS CYST ON PENIS AND SCROTUM;  Surgeon: Marissa Nestle, MD;  Location:  [facility];  Service: Urology;  Laterality: N/A;   PILONIDAL CYST EXCISION N/A 04/16/2013   Procedure: CYST EXCISION PILONIDAL SIMPLE;  Surgeon: Jamesetta So, MD;  Location: [facility];  Service: General;  Laterality: N/A;       Home Medications    Prior to Admission medications   Medication Sig Start Date End Date Taking? Authorizing Provider  Evolocumab (REPATHA SURECLICK) [id number] MG/ML SOAJ INJECT 140 MG  SUBCUTANEOUSLY ONCE EVERY 14 DAYS 07/15/22   Satira Sark, MD  famotidine (PEPCID) 20 MG tablet Take 1 tablet (20 mg total) by mouth 2 (two) times daily. Take one tablet twice daily for two days 07/02/22   Carmin Muskrat, MD  losartan (COZAAR) 100 MG tablet Take 100 mg by mouth daily. 08/22/21   [provider]  predniSONE (DELTASONE) 10 MG tablet Take 3 tabs (30mg ) twice daily for 3 days, then 2 tabs (20mg ) twice daily for 3 days, then 1 tab (10mg ) twice daily for 3 days, then STOP. Patient not taking: Reported on 07/02/2022 05/01/22   Valentina Shaggy, MD  sucralfate (CARAFATE) 1 g tablet Take 1 tablet (1 g total) by mouth 4 (four) times daily -  with meals and at bedtime. 07/02/22   Carmin Muskrat, MD    Family History Family History  Problem Relation Age of Onset   Heart disease Mother    Migraines Mother    Cancer Father    Migraines Brother    Heart disease Maternal Grandmother    Migraines Maternal  Grandmother    Heart disease Maternal Grandfather    Migraines Maternal Uncle    Migraines Maternal Uncle    Colon cancer Neg Hx        grandmother with colon cancer   Colon polyps Neg Hx     Social History Social History   Tobacco Use   Smoking status: Every Day    Packs/day: 0.50    Years: 10.00    Additional pack years: 0.00    Total pack years: 5.00    Types: Cigarettes   Smokeless tobacco: Never  Vaping Use   Vaping Use: Some days  Substance Use Topics   Alcohol use: Never   Drug use: Never     Allergies   Atorvastatin, Crestor [rosuvastatin],  and Pravastatin   Review of Systems Review of Systems Per HPI  Physical Exam Triage Vital Signs ED Triage Vitals  Enc Vitals Group     BP 07/21/22 1441 133/84     Pulse Rate 07/21/22 1441 94     Resp 07/21/22 1441 20     Temp 07/21/22 1441 97.8 F (36.6 C)     Temp Source 07/21/22 1441 Oral     SpO2 07/21/22 1441 94 %     Weight --      Height --      Head Circumference --      Peak Flow --      Pain Score 07/21/22 1439 7     Pain Loc --      Pain Edu? --      Excl. in [place]? --    No data found.  Updated Vital Signs BP 133/84 (BP Location: Right Arm)   Pulse 94   Temp 97.8 F (36.6 C) (Oral)   Resp 20   SpO2 94%   Visual Acuity Right Eye Distance:   Left Eye Distance:   Bilateral Distance:    Right Eye Near:   Left Eye Near:    Bilateral Near:     Physical Exam Vitals and nursing note reviewed.  Constitutional:      Appearance: Normal appearance.  HENT:     Head: Atraumatic.  Eyes:     Extraocular Movements: Extraocular movements intact.     Conjunctiva/sclera: Conjunctivae normal.  Cardiovascular:     Rate and Rhythm: Normal rate and regular rhythm.  Pulmonary:     Effort: Pulmonary effort is normal.     Breath sounds: Normal breath sounds.  Musculoskeletal:        General: Tenderness present. No swelling, deformity or signs of injury. Normal range of motion.     Cervical back: Normal range of motion and neck supple.     Comments: No joint edema, erythema, diffuse tenderness to palpation to the muscles of the forearm and distal upper arms bilateral upper extremities  Skin:    General: Skin is warm and dry.     Findings: No bruising, erythema, lesion or rash.  Neurological:     General: No focal deficit present.     Mental Status: He is oriented to person, place, and time.     Comments: Bilateral upper extremities neurovascularly intact  Psychiatric:        Mood and Affect: Mood normal.        Thought Content: Thought content normal.         Judgment: Judgment normal.      UC Treatments / Results  Labs (all labs ordered are listed, but only abnormal results are displayed) Labs  Reviewed  COMPREHENSIVE METABOLIC PANEL  CK  POCT URINALYSIS DIP (MANUAL ENTRY)    EKG   Radiology No results found.  Procedures Procedures (including critical care time)  Medications Ordered in UC Medications - No data to display  Initial Impression / Assessment and Plan / UC Course  I have reviewed the triage vital signs and the nursing notes.  Pertinent labs & imaging results that were available during my care of the patient were reviewed by me and considered in my medical decision making (see chart for details).     Exam and vital signs benign today, urinalysis without any abnormalities concerning for severe dehydration or rhabdomyolysis.  For reassurance per patient request will obtain labs to rule this out further but very low suspicion at this time of rhabdomyolysis.  More suspicious for muscular aches, treat with Epsom salt soaks, good hydration, continue muscle relaxers and over-the-counter pain relievers.  Return for worsening symptoms.  Final Clinical Impressions(s) / UC Diagnoses   Final diagnoses:  Myalgia  Bilateral arm pain   Discharge Instructions   None    ED Prescriptions   None    PDMP not reviewed this encounter.   Volney American, [place] 07/21/22 1528

## 2022-07-23 LAB — COMPREHENSIVE METABOLIC PANEL
ALT: 41 IU/L (ref 0–44)
AST: 19 IU/L (ref 0–40)
Albumin/Globulin Ratio: 2.1 (ref 1.2–2.2)
Albumin: 5 g/dL (ref 4.1–5.1)
Alkaline Phosphatase: 80 IU/L (ref 44–121)
BUN/Creatinine Ratio: 11 (ref 9–20)
BUN: 10 mg/dL (ref 6–20)
Bilirubin Total: 0.2 mg/dL (ref 0.0–1.2)
CO2: 23 mmol/L (ref 20–29)
Calcium: 10 mg/dL (ref 8.7–10.2)
Chloride: 101 mmol/L (ref 96–106)
Creatinine, Ser: 0.94 mg/dL (ref 0.76–1.27)
Globulin, Total: 2.4 g/dL (ref 1.5–4.5)
Glucose: 85 mg/dL (ref 70–99)
Potassium: 4.5 mmol/L (ref 3.5–5.2)
Sodium: 139 mmol/L (ref 134–144)
Total Protein: 7.4 g/dL (ref 6.0–8.5)
eGFR: 108 mL/min/{1.73_m2} (ref >59)

## 2022-07-23 LAB — CK: Total CK: 84 U/L (ref 49–439)

## 2022-08-05 DIAGNOSIS — J310 Chronic rhinitis: Secondary | ICD-10-CM | POA: Diagnosis not present

## 2022-08-05 DIAGNOSIS — H6121 Impacted cerumen, right ear: Secondary | ICD-10-CM | POA: Diagnosis not present

## 2022-08-05 DIAGNOSIS — H903 Sensorineural hearing loss, bilateral: Secondary | ICD-10-CM | POA: Diagnosis not present

## 2022-08-05 DIAGNOSIS — J343 Hypertrophy of nasal turbinates: Secondary | ICD-10-CM | POA: Diagnosis not present

## 2022-08-05 DIAGNOSIS — J342 Deviated nasal septum: Secondary | ICD-10-CM | POA: Diagnosis not present

## 2022-08-05 DIAGNOSIS — H9203 Otalgia, bilateral: Secondary | ICD-10-CM | POA: Diagnosis not present

## 2022-08-06 ENCOUNTER — Encounter: Payer: Self-pay | Admitting: Nurse Practitioner

## 2022-08-06 ENCOUNTER — Ambulatory Visit: Payer: BC Managed Care – PPO | Admitting: Nurse Practitioner

## 2022-08-06 VITALS — BP 136/94 | HR 97 | Ht 71.5 in | Wt 227.0 lb

## 2022-08-06 DIAGNOSIS — E0590 Thyrotoxicosis, unspecified without thyrotoxic crisis or storm: Secondary | ICD-10-CM | POA: Diagnosis not present

## 2022-08-06 DIAGNOSIS — R7989 Other specified abnormal findings of blood chemistry: Secondary | ICD-10-CM | POA: Diagnosis not present

## 2022-08-06 NOTE — Progress Notes (Signed)
08/06/2022     Endocrinology Consult Note    Subjective:    Patient ID: Tyler Dean, male    DOB: [birth year], PCP Lovey Newcomer, PA.   Past Medical History:  Diagnosis Date   ADHD    Essential hypertension    Hyperlipidemia    Migraines     Past Surgical History:  Procedure Laterality Date   CYSTOSCOPY N/A 04/27/2013   Procedure: CYSTOSCOPY;  Surgeon: Ky Barban, MD;  Location: [facility];  Service: Urology;  Laterality: N/A;   DENTAL SURGERY  05/19/2019   EAR CYST EXCISION N/A 04/27/2013   Procedure: EXCISION OF SEBACEOUS CYST ON PENIS AND SCROTUM;  Surgeon: Ky Barban, MD;  Location: [facility];  Service: Urology;  Laterality: N/A;   PILONIDAL CYST EXCISION N/A 04/16/2013   Procedure: CYST EXCISION PILONIDAL SIMPLE;  Surgeon: Dalia Heading, MD;  Location: [facility];  Service: General;  Laterality: N/A;    Social History   Socioeconomic History   Marital status: Single    Spouse name: Not on file   Number of children: 1   Years of education: Not on file   Highest education level: Associate degree: academic program  Occupational History   Occupation: [employer]  Tobacco Use   Smoking status: Every Day    Packs/day: 0.50    Years: 10.00    Additional pack years: 0.00    Total pack years: 5.00    Types: Cigarettes   Smokeless tobacco: Never  Vaping Use   Vaping Use: Some days  Substance and Sexual Activity   Alcohol use: Never   Drug use: Never   Sexual activity: Not on file  Other Topics Concern   Not on file  Social History Narrative   Lives at home with his child   Right handed   Caffeine: 2 cups coffee/day, 1-2 cups soda/day   Social Determinants of Health   Financial Resource Strain: Not on file  Food Insecurity: Not on file  Transportation Needs: Not on file  Physical Activity: Not on file  Stress: Not on file  Social Connections: Not on file    Family History  Problem Relation Age of Onset   Heart disease Mother    Migraines  Mother    Cancer Father    Migraines Brother    Heart disease Maternal Grandmother    Migraines Maternal Grandmother    Heart disease Maternal Grandfather    Migraines Maternal Uncle    Migraines Maternal Uncle    Colon cancer Neg Hx        grandmother with colon cancer   Colon polyps Neg Hx     Outpatient Encounter Medications as of 08/06/2022  Medication Sig   Evolocumab (REPATHA SURECLICK) 140 MG/ML SOAJ INJECT 140 MG  SUBCUTANEOUSLY ONCE EVERY 14 DAYS   metoprolol succinate (TOPROL-XL) 50 MG 24 hr tablet Take 50 mg by mouth daily.   [DISCONTINUED] famotidine (PEPCID) 20 MG tablet Take 1 tablet (20 mg total) by mouth 2 (two) times daily. Take one tablet twice daily for two days (Patient not taking: Reported on 08/06/2022)   [DISCONTINUED] losartan (COZAAR) 100 MG tablet Take 100 mg by mouth daily. (Patient not taking: Reported on 08/06/2022)   [DISCONTINUED] predniSONE (DELTASONE) 10 MG tablet Take 3 tabs (30mg ) twice daily for 3 days, then 2 tabs (20mg ) twice daily for 3 days, then 1 tab (10mg ) twice daily for 3 days, then STOP. (Patient not taking: Reported on 07/02/2022)   [  DISCONTINUED] sucralfate (CARAFATE) 1 g tablet Take 1 tablet (1 g total) by mouth 4 (four) times daily -  with meals and at bedtime. (Patient not taking: Reported on 08/06/2022)   No facility-administered encounter medications on file as of 08/06/2022.    ALLERGIES: Allergies  Allergen Reactions   Atorvastatin     myalgias   Crestor [Rosuvastatin]     Myalgia   Pravastatin     myalgias    VACCINATION STATUS: Immunization History  Administered Date(s) Administered   Tdap 07/23/2020     HPI  Tyler Dean is 37 y.o. male who presents today with a medical history as above. he is being seen in consultation for hyperthyroidism requested by Lovey NewcomerBoyd, William S, PA.  he has been dealing with symptoms of brain fog, joint pain, paraesthesias, pressure in his eyes, ears, dizziness, change in vision, diarrhea, soreness  to left side of neck, and shortness of breath for about 2 years. These symptoms are progressively worsening and troubling to him.  his most recent thyroid labs revealed marginally suppressed TSH of 0.372 and normal FT4 of 1.24 on 05/21/22.  he denies dysphagia, choking, no recent voice change.    he denies any known family history of thyroid dysfunction and denies family hx of thyroid cancer. he denies personal history of goiter. he is not on any anti-thyroid medications nor on any thyroid hormone supplements. Denies use of Biotin containing supplements.  he is willing to proceed with appropriate work up and therapy for thyrotoxicosis.  He notes all of these symptoms arose about 2 years ago when he was exposed to mold in his work office.  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   Review of systems  Constitutional: + Minimally fluctuating body weight, current Body mass index is 31.22 kg/m., no fatigue, no subjective hyperthermia, no subjective hypothermia Eyes: + blurry vision, no xerophthalmia ENT: + sore throat (mostly on left side), no nodules palpated in throat, no dysphagia/odynophagia, no hoarseness, + pressure in ears, eyes Cardiovascular: no chest pain, + shortness of breath (is smoker), no palpitations, no leg swelling Respiratory: no cough, + shortness of breath Gastrointestinal: no nausea/vomiting, + diarrhea Musculoskeletal: + muscle/joint aches Skin: no rashes, no hyperemia Neurological: + tremors, + numbness/ tingling to bilateral hands, + dizziness Psychiatric: no depression, no anxiety   Objective:    BP (!) 136/94 (BP Location: Right Arm, Patient Position: Sitting, Cuff Size: Large) Comment: Retake - manuel Cuff patient had just taken his BP medication  Pulse  97   Ht 5' 11.5" (1.816 m)   Wt 227 lb (103 kg)   BMI 31.22 kg/m   Wt Readings from Last 3 Encounters:  08/06/22 227 lb (103 kg)  07/02/22 225 lb (102.1 kg)  05/01/22 227 lb 4 oz (103.1 kg)     BP Readings from Last 3 Encounters:  08/06/22 (!) 136/94  07/21/22 133/84  07/02/22 (!) 138/98                          Physical Exam- Limited  Constitutional:  Body mass index is 31.22 kg/m. , not in acute distress, normal state of mind Eyes:  EOMI, no exophthalmos Neck: Supple Thyroid: No gross goiter, mildly enlarged on left side  Cardiovascular: RRR, no murmurs, rubs, or gallops, no edema Respiratory: Adequate breathing efforts, no crackles, rales, rhonchi, or wheezing, lung sounds diminished in bases Musculoskeletal: no gross deformities, strength intact in all four extremities, no gross restriction of joint movements Skin:  no rashes, no hyperemia, nicotinic discoloration to fingers/nails bilaterally Neurological: + slight tremor with outstretched hands on left side, DTR normal in BLE   CMP     Component Value Date/Time   NA 139 07/21/2022 1520   K 4.5 07/21/2022 1520   CL 101 07/21/2022 1520   CO2 23 07/21/2022 1520   GLUCOSE 85 07/21/2022 1520   GLUCOSE 99 07/02/2022 1025   BUN 10 07/21/2022 1520   CREATININE 0.94 07/21/2022 1520   CALCIUM 10.0 07/21/2022 1520   PROT 7.4 07/21/2022 1520   ALBUMIN 5.0 07/21/2022 1520   AST 19 07/21/2022 1520   ALT 41 07/21/2022 1520   ALKPHOS 80 07/21/2022 1520   BILITOT 0.2 07/21/2022 1520   GFRNONAA >60 07/02/2022 1025   GFRAA >60 08/30/2019 1803     CBC    Component Value Date/Time   WBC 9.3 07/02/2022 1025   RBC 4.80 07/02/2022 1025   HGB 15.2 07/02/2022 1025   HGB 16.4 08/08/2020 1004   HCT 44.4 07/02/2022 1025   HCT 47.4 08/08/2020 1004   PLT 319 07/02/2022 1025   PLT 310 08/08/2020 1004   MCV 92.5 07/02/2022 1025   MCV 90 08/08/2020 1004   MCH 31.7 07/02/2022 1025   MCHC 34.2 07/02/2022 1025   RDW 12.4  07/02/2022 1025   RDW 12.4 08/08/2020 1004   LYMPHSABS 1.7 12/28/2021 1146   LYMPHSABS 2.3 08/08/2020 1004   MONOABS 0.4 12/28/2021 1146   EOSABS 0.1 12/28/2021 1146   EOSABS 0.3 08/08/2020 1004   BASOSABS 0.0 12/28/2021 1146   BASOSABS 0.1 08/08/2020 1004     Diabetic Labs (most recent): No results found for: "HGBA1C", "MICROALBUR"  Lipid Panel  No results found for: "CHOL", "TRIG", "HDL", "CHOLHDL", "VLDL", "LDLCALC", "LDLDIRECT", "LABVLDL"   No results found for: "TSH", "FREET4"      Assessment & Plan:   1) Abnormal TSH 2) Subclinical hyperthyroidism  he is being seen at a kind request of Lovey Newcomer, PA.  his history and most recent labs are reviewed, and he was examined clinically. Subjective and objective findings are inconsistent with thyrotoxicosis from primary hyperthyroidism. However, the potential risks of untreated thyrotoxicosis and the need for definitive therapy have been discussed in detail with him, and he agrees to proceed with diagnostic workup and treatment plan.   I will repeat full profile thyroid function tests today (including antibody testing to assess for autoimmune thyroid dysfunction) and confirmatory thyroid uptake and scan will be scheduled to be done as soon as possible.  I will also order thyroid ultrasound to assess mild enlargement on left side associated with pain (had CT of neck which did not show any abnormalities in the past).   Options of therapy are discussed with him.  We discussed the option of treating it with medications including methimazole or PTU which may have side effects including rash, transaminitis, and bone marrow suppression.  We also discussed the option of definitive therapy with RAI ablation of the thyroid. If he is found to have primary hyperthyroidism from Graves' disease , toxic multinodular goiter or toxic nodular goiter the preferred modality of treatment would be I-131 thyroid ablation. Surgery is another choice of  treatment in some cases, in his case surgery is not  a good fit for presentation with only mild goiter.  -Patient is made aware of the high likelihood of post ablative hypothyroidism with subsequent need for lifelong thyroid hormone replacement. heunderstands this outcome and he is  willing to proceed.      he will return in 4 weeks to discuss results and develop plan for treatment.   I did not initiate any new prescriptions at today's visit.     -Patient is advised to maintain close follow up with Lovey Newcomer, PA for primary care needs.  He is concerned with CIRS diagnosis.  I am unsure of who may be better suited to help him with this.  I did tell him I would do some research on what specialist may be needed to investigate this further, but in the interim wanted to completely work up the thyroid to rule it out as a possible cause of his myriad of symptoms.   - Time spent with the patient: 60 minutes, of which >50% was spent in obtaining information about his symptoms, reviewing his previous labs, evaluations, and treatments, counseling him about his hyperthyroidism , and developing a plan to confirm the diagnosis and long term treatment as necessary. Please refer to "Patient Self Inventory" in the Media tab for reviewed elements of pertinent patient history.  Tyler Aran participated in the discussions, expressed understanding, and voiced agreement with the above plans.  All questions were answered to his satisfaction. he is encouraged to contact clinic should he have any questions or concerns prior to his return visit.   Follow up plan: Return in about 4 weeks (around 09/03/2022) for Thyroid follow up, Previsit labs, thyroid ultrasound, thyroid uptake and scan.   Thank you for involving me in the care of this pleasant patient, and I will continue to update you with his progress.    Ronny Bacon, [facility] [address] [place], [place]  [address] Phone: [phone] Fax: [phone]  08/06/2022, 4:25 PM

## 2022-08-14 DIAGNOSIS — D2272 Melanocytic nevi of left lower limb, including hip: Secondary | ICD-10-CM | POA: Diagnosis not present

## 2022-08-14 DIAGNOSIS — B078 Other viral warts: Secondary | ICD-10-CM | POA: Diagnosis not present

## 2022-08-16 ENCOUNTER — Ambulatory Visit (HOSPITAL_COMMUNITY)
Admission: RE | Admit: 2022-08-16 | Discharge: 2022-08-16 | Disposition: A | Discharge status: Home / Self Care | Payer: BC Managed Care – PPO | Source: Ambulatory Visit | Attending: Nurse Practitioner | Admitting: Nurse Practitioner

## 2022-08-16 DIAGNOSIS — E0590 Thyrotoxicosis, unspecified without thyrotoxic crisis or storm: Secondary | ICD-10-CM | POA: Insufficient documentation

## 2022-08-16 DIAGNOSIS — R7989 Other specified abnormal findings of blood chemistry: Secondary | ICD-10-CM | POA: Insufficient documentation

## 2022-08-16 DIAGNOSIS — E049 Nontoxic goiter, unspecified: Secondary | ICD-10-CM | POA: Diagnosis not present

## 2022-08-28 ENCOUNTER — Encounter (HOSPITAL_COMMUNITY)
Admission: RE | Admit: 2022-08-28 | Discharge: 2022-08-28 | Disposition: A | Discharge status: Home / Self Care | Payer: BC Managed Care – PPO | Source: Ambulatory Visit | Attending: Nurse Practitioner | Admitting: Nurse Practitioner

## 2022-08-28 DIAGNOSIS — E0590 Thyrotoxicosis, unspecified without thyrotoxic crisis or storm: Secondary | ICD-10-CM | POA: Diagnosis not present

## 2022-08-28 DIAGNOSIS — R7989 Other specified abnormal findings of blood chemistry: Secondary | ICD-10-CM | POA: Diagnosis not present

## 2022-08-28 MED ORDER — SODIUM IODIDE I-123 7.4 MBQ CAPS
455.0000 | ORAL_CAPSULE | Freq: Once | ORAL | Status: AC
  Administered 2022-08-28: 455 via ORAL

## 2022-08-29 ENCOUNTER — Encounter (HOSPITAL_COMMUNITY)
Admission: RE | Admit: 2022-08-29 | Discharge: 2022-08-29 | Disposition: A | Discharge status: Home / Self Care | Payer: BC Managed Care – PPO | Source: Ambulatory Visit | Attending: Nurse Practitioner | Admitting: Nurse Practitioner

## 2022-08-29 DIAGNOSIS — E0590 Thyrotoxicosis, unspecified without thyrotoxic crisis or storm: Secondary | ICD-10-CM | POA: Diagnosis not present

## 2022-09-04 DIAGNOSIS — R059 Cough, unspecified: Secondary | ICD-10-CM | POA: Diagnosis not present

## 2022-09-04 DIAGNOSIS — B349 Viral infection, unspecified: Secondary | ICD-10-CM | POA: Diagnosis not present

## 2022-09-04 DIAGNOSIS — Z6831 Body mass index (BMI) 31.0-31.9, adult: Secondary | ICD-10-CM | POA: Diagnosis not present

## 2022-09-05 ENCOUNTER — Ambulatory Visit: Payer: BC Managed Care – PPO | Admitting: Nurse Practitioner

## 2022-09-05 DIAGNOSIS — E0590 Thyrotoxicosis, unspecified without thyrotoxic crisis or storm: Secondary | ICD-10-CM

## 2022-09-05 DIAGNOSIS — R7989 Other specified abnormal findings of blood chemistry: Secondary | ICD-10-CM

## 2022-09-19 DIAGNOSIS — R7989 Other specified abnormal findings of blood chemistry: Secondary | ICD-10-CM | POA: Diagnosis not present

## 2022-09-19 DIAGNOSIS — E0590 Thyrotoxicosis, unspecified without thyrotoxic crisis or storm: Secondary | ICD-10-CM | POA: Diagnosis not present

## 2022-09-21 LAB — THYROID PEROXIDASE ANTIBODY: Thyroperoxidase Ab SerPl-aCnc: 10 IU/mL (ref 0–34)

## 2022-09-21 LAB — T3, FREE: T3, Free: 3.7 pg/mL (ref 2.0–4.4)

## 2022-09-21 LAB — T4, FREE: Free T4: 1.24 ng/dL (ref 0.82–1.77)

## 2022-09-21 LAB — THYROGLOBULIN ANTIBODY: Thyroglobulin Antibody: <1 IU/mL (ref 0.0–0.9)

## 2022-09-21 LAB — TSH: TSH: 0.439 u[IU]/mL — ABNORMAL LOW (ref 0.450–4.500)

## 2022-10-28 ENCOUNTER — Ambulatory Visit: Payer: BC Managed Care – PPO | Admitting: Neurology

## 2022-10-28 ENCOUNTER — Encounter: Payer: Self-pay | Admitting: Neurology

## 2022-10-28 VITALS — BP 138/95 | HR 94 | Ht 72.0 in | Wt 219.0 lb

## 2022-10-28 DIAGNOSIS — M5412 Radiculopathy, cervical region: Secondary | ICD-10-CM

## 2022-10-28 DIAGNOSIS — E538 Deficiency of other specified B group vitamins: Secondary | ICD-10-CM | POA: Diagnosis not present

## 2022-10-28 DIAGNOSIS — R4189 Other symptoms and signs involving cognitive functions and awareness: Secondary | ICD-10-CM | POA: Diagnosis not present

## 2022-10-28 DIAGNOSIS — R748 Abnormal levels of other serum enzymes: Secondary | ICD-10-CM

## 2022-10-28 DIAGNOSIS — M791 Myalgia, unspecified site: Secondary | ICD-10-CM

## 2022-10-28 DIAGNOSIS — M6250 Muscle wasting and atrophy, not elsewhere classified, unspecified site: Secondary | ICD-10-CM

## 2022-10-28 DIAGNOSIS — R200 Anesthesia of skin: Secondary | ICD-10-CM

## 2022-10-28 DIAGNOSIS — R292 Abnormal reflex: Secondary | ICD-10-CM

## 2022-10-28 DIAGNOSIS — E559 Vitamin D deficiency, unspecified: Secondary | ICD-10-CM

## 2022-10-28 DIAGNOSIS — R7303 Prediabetes: Secondary | ICD-10-CM

## 2022-10-28 DIAGNOSIS — M6289 Other specified disorders of muscle: Secondary | ICD-10-CM

## 2022-10-28 DIAGNOSIS — E531 Pyridoxine deficiency: Secondary | ICD-10-CM | POA: Diagnosis not present

## 2022-10-28 DIAGNOSIS — M6281 Muscle weakness (generalized): Secondary | ICD-10-CM

## 2022-10-28 DIAGNOSIS — M2550 Pain in unspecified joint: Secondary | ICD-10-CM

## 2022-10-28 DIAGNOSIS — G1229 Other motor neuron disease: Secondary | ICD-10-CM

## 2022-10-28 DIAGNOSIS — E519 Thiamine deficiency, unspecified: Secondary | ICD-10-CM

## 2022-10-28 DIAGNOSIS — R208 Other disturbances of skin sensation: Secondary | ICD-10-CM

## 2022-10-28 DIAGNOSIS — M542 Cervicalgia: Secondary | ICD-10-CM

## 2022-10-28 DIAGNOSIS — G8929 Other chronic pain: Secondary | ICD-10-CM

## 2022-10-28 DIAGNOSIS — M779 Enthesopathy, unspecified: Secondary | ICD-10-CM

## 2022-10-28 DIAGNOSIS — H5350 Unspecified color vision deficiencies: Secondary | ICD-10-CM

## 2022-10-28 DIAGNOSIS — H538 Other visual disturbances: Secondary | ICD-10-CM

## 2022-10-28 NOTE — Progress Notes (Signed)
[facility]    Provider:  Dr Lucia Gaskins Requesting Provider: Daria Pastures PA Primary Care Provider:  Lovey Newcomer, PA  CC: "Brain Fog"  10/28/2022: Here for follow up on brain fog. Infallamtion, pressure, ears will hurt, pressure behind the eyes. We tried him on Ajovy but he didn't really take it.  He has been tested for CIRS.  Triggers and inflammation. Ut doesn't go away. Per a Microbiologist, "Chronic Inflammatory Response Syndrome (CIRS), also known as biotoxin illness, is a progressive, multi-system illness caused by exposure to biotoxins. The ongoing inflammation can affect almost any organ system in the body and can become debilitating if left untreated", Honestly I explained I have no idea what that it. He has brain fog, pressure in the head and eyes, contrast colors are not as contrasting, He has had eye exams in the past been 20/20 OU He has seen endocrinolist(for abnormal tsh), ENT, Rheumatology is next. Total CK was normal per chart review. He has joing pain and went to to the gym and got rabdomyelitis. ESR 04/2022 normal.HIV negative.+THC 12/2021. In 3.2923 when I saw hm last we checjed B1, mmc, b12 was not collected we will check a plethora of labs, muscle weaknes, muscle pain, joint pain, no rashes, abdominal pain,no mood swings, sweats, brain fog described as not being as sharp. Whole body constant inflammation and pain and burning in different areas of the body. Weakness. Blurry vision chronic. +SOB. Headache in the temple area. He walks fine, no imbalance. Nec pain and radicular symptoms. Myalgias. Elevated CK in the past.   Reviewed images and labs personally:  MRI brain 08/2021:  Narrative & Impression    [facility] [address] [place], [place] [address] [phone]   NEUROIMAGING REPORT     STUDY DATE: 09/18/2021  PATIENT NAME: Tyler Dean DOB: Feb 23, 1986 MRN: [id number]   ORDERING CLINICIAN: Dr.Elleanor Guyett CLINICAL HISTORY:  37 year old patient being evaluated for headache, dizziness and brain fog COMPARISON FILMS: CT head without contrast 02/22/2020 EXAM: MRI brain with and without contrast  TECHNIQUE: Sagittal T1, axial T1, T2, FLAIR, DWI, ADC map, GRE, coronal T2 and postcontrast axial and coronal T1 images were obtained through the brain CONTRAST: 20 mL IV MultiHance  IMAGING SITE: [facility] [address]   FINDINGS:  The brain parenchyma shows no abnormal signal intensities.  No structural lesion, tumor or infarct is noted.  Diffusion-weighted imaging is negative for acute ischemia.  Gradient-echo sequences do not show any significant microhemorrhages.  The subarachnoid spaces and ventricular system appear normal.  Cortical sulci and gyri show normal appearance.  Extra-axial brain structures appear normal.  Calvarium shows no abnormalities.  Orbits appear unremarkable.  Paranasal sinuses show only mild chronic mucosal thickening.  The pituitary gland and cerebellar tonsils appear normal.  Visualized portion of the upper cervical spine and craniovertebral junction appear normal.  Postcontrast images do not result in any abnormal areas of enhancement.       IMPRESSION: Unremarkable MRI scan of the brain with and without contrast.     Recent Results (from the past 2160 hour(s))  TSH     Status: Abnormal   Collection Time: 09/19/22  1:45 PM  Result Value Ref Range   TSH 0.439 (L) 0.450 - 4.500 uIU/mL  T4, free     Status: None   Collection Time: 09/19/22  1:45 PM  Result Value Ref Range   Free T4 1.24 0.82 - 1.77 ng/dL  T3, free  Status: None   Collection Time: 09/19/22  1:45 PM  Result Value Ref Range   T3, Free 3.7 2.0 - 4.4 pg/mL  Thyroid peroxidase antibody     Status: None   Collection Time: 09/19/22  1:45 PM  Result Value Ref Range   Thyroperoxidase Ab SerPl-aCnc 10 0 - 34 IU/mL  Thyroglobulin antibody     Status: None   Collection Time: 09/19/22  1:45 PM  Result Value Ref Range    Thyroglobulin Antibody <1.0 0.0 - 0.9 IU/mL    Comment: Thyroglobulin Antibody measured by [employer] Methodology It should be noted that the presence of thyroglobulin antibodies may not be pathogenic nor diagnostic, especially at very low levels. The assay manufacturer has found that four percent of individuals without evidence of thyroid disease or autoimmunity will have positive TgAb levels up to 4 IU/mL.    Patient complains of symptoms per HPI as well as the following symptoms: inflammation,fatigue . Pertinent negatives and positives per HPI. All others negative   History July 26, 2021: This is a patient we have seen in the past for migraines in 2021.  Past medical history hypertension, hyperlipidemia, ADHD and migraines.  He is here for a new chief complaint of "brain fog", I [facility], his blood pressure continues to be elevated, was changed to Norvasc and seems to have improved, feels like he has "brain fog for the last 6 months" and pressure on the eyes, vision feels cloudy although his VA OU 20/20 and OS 20/20 and OD 20/20.  Blood pressure in the home per patient is 160/110 in the office it was 136/74, described as chronic, gradual in onset and ongoing, I reviewed physical examination which showed normal general, ears nose throat, neck, respiratory, cardiovascular, chest, abdomen, neurologic, diagnosis was essential hypertension, mixed hyperlipidemia, overweight and "brain fog".  He appears to be a smoker.  Brain fog improved with changes to Norvasc.  When I saw him in the past in 2021 I did order an MRI of the brain and he never had that completed.  He says he has brain fog, hard to concentrate, behind the eyes, no signs or symptoms of sleep apnea. He feels like it is related to his calcium channel blocker for his BP now that he has been mre adherent. He also started repatha. He wakes up refreshed. He still has migraines but is a totally different  feeling. The migraines were treated with rizariptan, he never had the MRi brain completed. He is trying to change medications, then he may try coming off of the Repatha. He still has headaches, wakes up with them and can be worse positionally with changes in vision. No other focal neurologic deficits, associated symptoms, inciting events or modifiable factors.   TSH normal, FT4 1.35 04/2021. CBC/CMP 12/2018 unremarkable except for eleavted ALT  HPI 05/24/2019:  AXEL LUIKART is a 37 y.o. male here as requested by Daria Pastures PA for migraines. I reviewed Daria Pastures PA's notes. PMHx HTN, HLD, ADHD,migraines.   Patient was last seen March 31, 2019, multiple elevated BP readings at home and at nurses office typically in the 140/100 range although often has pulse over 100, he denied chest pain, shortness of breath, nausea, vomiting, headache, dizziness, vision changes, cholesterol was elevated and unable to take atorvastatin total 269 and LDL 209, he takes Adderall 30 mg twice daily and with marked improvement with working efficiency, also on Strattera, Lexapro, sumatriptan hand, he is on Lexapro for anxiety,  for his blood pressure he was continued on metoprolol extended release 50 mg once daily, migraine headaches previously seen by Dr. Charlott Holler, increase in frequency over the last 6 months, taking lots of Goody's, they started a trial of Imitrex and referred him here.  He has had migraines since a child, even smelling pizza as a child would trigger migraines. They are behind his eye, start in the temple area, light and sound and smell sensitivity, pulsating/pounding/throbbing,nausea, no vomiting but feels like it, he just wants to lay down still in the dark. At least 16 migraine days a month. He wakes up with them. He wakes up with headaches quite frequently, he wakes with dry mouth. He has uncontrolled HTN. He tried Imitrex. He did not like the imitrex. Ongoing like this all year long or longer maybe even  all his life. Strong family history. His vision gets blurry with the migraines, not exertional. No other focal neurologic deficits, associated symptoms, inciting events or modifiable factors.  Meds tried: Diltiazem, metoprolol, escitalopram, topiramate, amitriptyline  Reviewed labs, cbc normal, cmp 06/2018 AST 50 and ALT 120 (he is aware and following with pcp)   Review of Systems: Patient complains of symptoms per HPI as well as the following symptoms:HTN. Pertinent negatives and positives per HPI. All others negative.   Social History   Socioeconomic History   Marital status: Single    Spouse name: Not on file   Number of children: 1   Years of education: Not on file   Highest education level: Associate degree: academic program  Occupational History   Occupation: [employer]  Tobacco Use   Smoking status: Every Day    Packs/day: 0.50    Years: 10.00    Additional pack years: 0.00    Total pack years: 5.00    Types: Cigarettes   Smokeless tobacco: Never  Vaping Use   Vaping Use: Some days  Substance and Sexual Activity   Alcohol use: Never   Drug use: Never   Sexual activity: Not on file  Other Topics Concern   Not on file  Social History Narrative   Lives at home with his child   Right handed   Caffeine: 2 cups coffee/day, 1-2 cups soda/day   Social Determinants of Health   Financial Resource Strain: Not on file  Food Insecurity: Not on file  Transportation Needs: Not on file  Physical Activity: Not on file  Stress: Not on file  Social Connections: Not on file  Intimate Partner Violence: Not on file    Family History  Problem Relation Age of Onset   Heart disease Mother    Migraines Mother    Cancer Father    Migraines Brother    Heart disease Maternal Grandmother    Migraines Maternal Grandmother    Heart disease Maternal Grandfather    Migraines Maternal Uncle    Migraines Maternal Uncle    Colon cancer Neg Hx        grandmother with colon cancer   Colon  polyps Neg Hx     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   Myalgia due to statin 01/14/2021   Chronic migraine without aura without status migrainosus, not intractable 05/24/2019   Morning headache 05/24/2019   Elevated LFTs 08/18/2018   Diverticulitis of colon 07/26/2017   Acute  maxillary sinusitis 03/24/2017   Essential hypertension 07/21/2016   Abdominal pain, epigastric 07/08/2016   Mixed hyperlipidemia 07/08/2016   Attention deficit disorder with hyperactivity 04/04/2016   Generalized anxiety disorder 02/27/2016   Contact dermatitis and other eczema 06/13/2015    Past Surgical History:  Procedure Laterality Date   CYSTOSCOPY N/A 04/27/2013   Procedure: CYSTOSCOPY;  Surgeon: Ky Barban, MD;  Location: [facility];  Service: Urology;  Laterality: N/A;   DENTAL SURGERY  05/19/2019   EAR CYST EXCISION N/A 04/27/2013   Procedure: EXCISION OF SEBACEOUS CYST ON PENIS AND SCROTUM;  Surgeon: Ky Barban, MD;  Location: [facility];  Service: Urology;  Laterality: N/A;   PILONIDAL CYST EXCISION N/A 04/16/2013   Procedure: CYST EXCISION PILONIDAL SIMPLE;  Surgeon: Dalia Heading, MD;  Location: [facility];  Service: General;  Laterality: N/A;    Current Outpatient Medications  Medication Sig Dispense Refill   Evolocumab (REPATHA SURECLICK) 140 MG/ML SOAJ INJECT 140 MG  SUBCUTANEOUSLY ONCE EVERY 14 DAYS 2 mL 3   metoprolol succinate (TOPROL-XL) 50 MG 24 hr tablet Take 50 mg by mouth daily.     No current facility-administered medications for this visit.    Allergies as of 10/28/2022 - Review Complete 10/28/2022  Allergen Reaction Noted   Atorvastatin  01/11/2021   Crestor [rosuvastatin]  01/11/2021   Pravastatin  01/11/2021    Vitals: BP (!) 138/95   Pulse 94   Ht 6' (1.829 m)   Wt 219 lb (99.3 kg)   BMI 29.70 kg/m  Last  Weight:  Wt Readings from Last 1 Encounters:  10/28/22 219 lb (99.3 kg)   Last Height:   Ht Readings from Last 1 Encounters:  10/28/22 6' (1.829 m)    Physical exam: Exam: Gen: NAD, conversant, well nourised, obese, well groomed                     CV: RRR, no MRG. No Carotid Bruits. No peripheral edema, warm, nontender Eyes: Conjunctivae clear without exudates or hemorrhage  Neuro: Detailed Neurologic Exam  Speech:    Speech is normal; fluent and spontaneous with normal comprehension.  Cognition:    The patient is oriented to person, place, and time;     recent and remote memory intact;     language fluent;     normal attention, concentration,     fund of knowledge Cranial Nerves:    The pupils are equal, round, and reactive to light. The fundi are normal and spontaneous venous pulsations are present. Visual fields are full to finger confrontation. Extraocular movements are intact. Trigeminal sensation is intact and the muscles of mastication are normal. The face is symmetric. The palate elevates in the midline. Hearing intact. Voice is normal. Shoulder shrug is normal. The tongue has normal motion without fasciculations.   Coordination:    Normal finger to nose and heel to shin. Normal rapid alternating movements.   Gait:    Heel-toe and tandem gait are normal.   Motor Observation:    No asymmetry, no atrophy, and no involuntary movements noted. Tone:    Normal muscle tone.    Posture:    Posture is normal. normal erect    Strength: Mild proximal weakness otherwise strength is V/V in the upper and lower limbs.      Sensation: intact to LT     Reflex Exam:  DTR's:    Deep tendon reflexes in the upper and lower extremities are normal bilaterally.  Toes:    The toes are upgoing bilaterally.   Clonus:    Clonus is absent.   Assessment/Plan: This is a really nice 37 year old gentleman who I saw in the past for chronic migraines, he has a history of migraines  going back to childhood and a very strong family history of migraines.  He is here for brain fog follow up and a plethora of neurologic symptoms, needs a thorough evaluation  Blood work today EMG/NCS to evaluate for neurogenic or myogenic muscle disease MRI cervical spine, MRI brain: MRI brain due to concerning symptoms of The primary encounter diagnosis was Brain fog. Diagnoses of Elevated CK, scren for Vitamin B1 deficiency, screen for Vitamin B12 deficiency, screen for Vitamin D deficiency, screen for Vitamin B6 deficiency, Prediabetes, Inflammation around joint, Muscle weakness, Muscular atrophy, unspecified site, Myalgia, Blurry vision, Chronic neck pain, Cervical radiculopathy, Numbness of arm, Color vision defect, Arthralgia, unspecified joint, Proximal weakness of extremity, Burning sensation, Chronic intractable headache, unspecified headache type, Babinski response, and Upper motor neuron lesion (HCC) were also pertinent to this visit.   to look for space occupying mass, chiari or intracranial hypertension (pseudotumor), strokes, malignancies, vasculidities, demyelination(multiple sclerosis) or other    Orders Placed This Encounter  Procedures   MR BRAIN W WO CONTRAST   MR CERVICAL SPINE W WO CONTRAST   B12 and Folate Panel   Methylmalonic acid, serum   Vitamin B1   Vitamin D, 25-hydroxy   Vitamin B6   Hemoglobin A1c   Sedimentation rate   C-reactive protein   CK   Hepatitis C antibody   Heavy metals, blood   Multiple Myeloma Panel (SPEP&IFE w/QIG)   ANA Comprehensive Panel   ANCA TITERS   Comprehensive metabolic panel   NCV with EMG(electromyography)   No orders of the defined types were placed in this encounter.   Cc: Lovey Newcomer, [place],  Daria Pastures PA  Naomie Dean, MD  [facility] [address] [place], [place] [address]  Phone [phone] Fax [phone]  I spent 80 minutes of face-to-face and non-face-to-face time with  patient on the  1. Brain fog   2. Elevated CK   3. scren for Vitamin B1 deficiency   4. screen for Vitamin B12 deficiency   5. screen for Vitamin D deficiency   6. screen for Vitamin B6 deficiency   7. Prediabetes   8. Inflammation around joint   9. Muscle weakness   10. Muscular atrophy, unspecified site   11. Myalgia   12. Blurry vision   13. Chronic neck pain   14. Cervical radiculopathy   15. Numbness of arm   16. Color vision defect   17. Arthralgia, unspecified joint   18. Proximal weakness of extremity   19. Burning sensation   20. Chronic intractable headache, unspecified headache type   21. Babinski response   22. Upper motor neuron lesion (HCC)     diagnosis.  This included previsit chart review, lab review, study review, order entry, electronic health record documentation, patient education on the different diagnostic and therapeutic options, counseling and coordination of care, risks and benefits of management, compliance, or risk factor reduction

## 2022-10-28 NOTE — Patient Instructions (Addendum)
Blood work  EMG/NCS MRI cervical spine, MRI brain  Electromyoneurogram Electromyoneurogram is a test to check how well your muscles and nerves are working. This procedure includes the combined use of electromyogram (EMG) and nerve conduction study (NCS). EMG is used to evaluate muscles and the nerves that control those muscles. NCS, which is also called electroneurogram, measures how well your nerves conduct electricity. The procedures should be done together to check if your muscles and nerves are healthy. If the results of the tests are abnormal, this may indicate disease or injury, such as a neuromuscular disease or peripheral nerve damage. Tell a health care provider about: Any allergies you have. All medicines you are taking, including vitamins, herbs, eye drops, creams, and over-the-counter medicines. Any bleeding problems you have. Any surgeries you have had. Any medical conditions you have. What are the risks? Generally, this is a safe procedure. However, problems may occur, including: Bleeding or bruising. Infection where the electrodes were inserted. What happens before the test? Medicines Take all of your usually prescribed medications before this testing is performed. Do not stop your blood thinners unless advised by your prescribing physician. General instructions Your health care provider may ask you to warm the limb that will be checked with warm water, hot pack, or wrapping the limb in a blanket. Do not use lotions or creams on the same day that you will be having the procedure. What happens during the test? For EMG  Your health care provider will ask you to stay in a position so that the muscle being studied can be accessed. You will be sitting or lying down. You may be given a medicine to numb the area (local anesthetic) and the skin will be disinfected. A very thin needle that has an electrode will be inserted into your muscle, one muscle at a time. Typically, multiple  muscles are evaluated during a single study. Another small electrode will be placed on your skin near the muscle. Your health care provider will ask you to continue to remain still. The electrodes will record the electrical activity of your muscles. You may see this on a monitor or hear it in the room. After your muscles have been studied at rest, your health care provider will ask you to contract or flex your muscles. The electrodes will record the electrical activity of your muscles. Your health care provider will remove the electrodes and the electrode needle when the procedure is finished. The procedure may vary among health care providers and hospitals. For NCS  An electrode that records your nerve activity (recording electrode) will be placed on your skin by the muscle that is being studied. An electrode that is used as a reference (reference electrode) will be placed near the recording electrode. A paste or gel will be applied to your skin between the recording electrode and the reference electrode. Your nerve will be stimulated with a mild shock. The speed of the nerves and strength of response is recorded by the electrodes. Your health care provider will remove the electrodes and the gel when the procedure is finished. The procedure may vary among health care providers and hospitals. What can I expect after the test? It is up to you to get your test results. Ask your health care provider, or the department that is doing the test, when your results will be ready. Your health care provider may: Give you medicines for any pain. Monitor the insertion sites to make sure that bleeding stops. You should be able  to drive yourself to and from the test. Discomfort can persist for a few hours after the test, but should be better the next day. Contact a health care provider if: You have swelling, redness, or drainage at any of the insertion sites. Summary Electromyoneurogram is a test to check  how well your muscles and nerves are working. If the results of the tests are abnormal, this may indicate disease or injury. This is a safe procedure. However, problems may occur, such as bleeding and infection. Your health care provider will do two tests to complete this procedure. One checks your muscles (EMG) and another checks your nerves (NCS). It is up to you to get your test results. Ask your health care provider, or the department that is doing the test, when your results will be ready. This information is not intended to replace advice given to you by your health care provider. Make sure you discuss any questions you have with your health care provider. Document Revised: 12/27/2020 Document Reviewed: 11/26/2020 Elsevier Patient Education  2024 [employer].

## 2022-10-29 ENCOUNTER — Telehealth: Payer: Self-pay | Admitting: Neurology

## 2022-10-29 LAB — MULTIPLE MYELOMA PANEL, SERUM

## 2022-10-29 LAB — ANA COMPREHENSIVE PANEL

## 2022-10-29 LAB — METHYLMALONIC ACID, SERUM

## 2022-10-29 LAB — C-REACTIVE PROTEIN: CRP: 4 mg/L (ref 0–10)

## 2022-10-29 LAB — VITAMIN B6

## 2022-10-29 LAB — HEAVY METALS, BLOOD

## 2022-10-29 LAB — COMPREHENSIVE METABOLIC PANEL: Glucose: 87 mg/dL (ref 70–99)

## 2022-10-29 NOTE — Telephone Encounter (Signed)
Hey Dr. Lucia Gaskins, I was working on your reschedules for 7/11 and this pt is down for an EMG as an add on. You have openings at the end of July/beginning of August. Are you okay with pushing his out until or would you like to work him in somewhere else?

## 2022-10-30 ENCOUNTER — Other Ambulatory Visit: Payer: Self-pay | Admitting: Cardiology

## 2022-10-30 LAB — COMPREHENSIVE METABOLIC PANEL: BUN: 15 mg/dL (ref 6–20)

## 2022-10-30 LAB — ANA COMPREHENSIVE PANEL: dsDNA Ab: <1 IU/mL (ref 0–9)

## 2022-11-02 LAB — HEAVY METALS, BLOOD
Lead, Blood: <1 ug/dL (ref 0.0–3.4)
Mercury: <1 ug/L (ref 0.0–14.9)

## 2022-11-02 LAB — MULTIPLE MYELOMA PANEL, SERUM
Albumin SerPl Elph-Mcnc: 4.2 g/dL (ref 2.9–4.4)
Albumin/Glob SerPl: 1.5 (ref 0.7–1.7)
Alpha 1: 0.3 g/dL (ref 0.0–0.4)
Alpha2 Glob SerPl Elph-Mcnc: 0.8 g/dL (ref 0.4–1.0)
B-Globulin SerPl Elph-Mcnc: 1.2 g/dL (ref 0.7–1.3)
Gamma Glob SerPl Elph-Mcnc: 0.6 g/dL (ref 0.4–1.8)
Globulin, Total: 2.9 g/dL (ref 2.2–3.9)
IgA/Immunoglobulin A, Serum: 124 mg/dL (ref 90–386)
IgM (Immunoglobulin M), Srm: 41 mg/dL (ref 20–172)

## 2022-11-02 LAB — SEDIMENTATION RATE: Sed Rate: 2 mm/h (ref 0–15)

## 2022-11-02 LAB — VITAMIN B1: Thiamine: 139.8 nmol/L (ref 66.5–200.0)

## 2022-11-02 LAB — COMPREHENSIVE METABOLIC PANEL WITH GFR
ALT: 55 IU/L — ABNORMAL HIGH (ref 0–44)
Alkaline Phosphatase: 74 IU/L (ref 44–121)
BUN/Creatinine Ratio: 20 (ref 9–20)
Bilirubin Total: <0.2 mg/dL (ref 0.0–1.2)
CO2: 24 mmol/L (ref 20–29)
Calcium: 9.4 mg/dL (ref 8.7–10.2)
Chloride: 103 mmol/L (ref 96–106)
Globulin, Total: 2.2 g/dL (ref 1.5–4.5)
Sodium: 143 mmol/L (ref 134–144)
Total Protein: 7.1 g/dL (ref 6.0–8.5)
eGFR: 120 mL/min/{1.73_m2} (ref >59)

## 2022-11-02 LAB — COMPREHENSIVE METABOLIC PANEL
AST: 19 IU/L (ref 0–40)
Albumin: 4.9 g/dL (ref 4.1–5.1)
Creatinine, Ser: 0.75 mg/dL — ABNORMAL LOW (ref 0.76–1.27)
Potassium: 4.2 mmol/L (ref 3.5–5.2)

## 2022-11-02 LAB — HEMOGLOBIN A1C
Est. average glucose Bld gHb Est-mCnc: 108 mg/dL
Hgb A1c MFr Bld: 5.4 % (ref 4.8–5.6)

## 2022-11-02 LAB — ANA COMPREHENSIVE PANEL
Anti JO-1: <0.2 AI (ref 0.0–0.9)
Centromere Ab Screen: <0.2 AI (ref 0.0–0.9)
Chromatin Ab SerPl-aCnc: <0.2 AI (ref 0.0–0.9)
ENA RNP Ab: <0.2 AI (ref 0.0–0.9)
ENA SM Ab Ser-aCnc: <0.2 AI (ref 0.0–0.9)
ENA SSA (RO) Ab: <0.2 AI (ref 0.0–0.9)
ENA SSB (LA) Ab: <0.2 AI (ref 0.0–0.9)
Scleroderma (Scl-70) (ENA) Antibody, IgG: <0.2 AI (ref 0.0–0.9)

## 2022-11-02 LAB — HEPATITIS C ANTIBODY: Hep C Virus Ab: NONREACTIVE

## 2022-11-02 LAB — B12 AND FOLATE PANEL
Folate: 9.5 ng/mL (ref >3.0)
Vitamin B-12: 1242 pg/mL (ref 232–1245)

## 2022-11-02 LAB — CK: Total CK: 62 U/L (ref 49–439)

## 2022-11-02 LAB — VITAMIN D 25 HYDROXY (VIT D DEFICIENCY, FRACTURES): Vit D, 25-Hydroxy: 22.1 ng/mL — ABNORMAL LOW (ref 30.0–100.0)

## 2022-11-04 ENCOUNTER — Encounter: Payer: Self-pay | Admitting: Neurology

## 2022-11-04 NOTE — Telephone Encounter (Signed)
I think we took care of him he is on the 15th thanks

## 2022-11-05 ENCOUNTER — Other Ambulatory Visit: Payer: Self-pay | Admitting: Neurology

## 2022-11-05 ENCOUNTER — Telehealth: Payer: Self-pay | Admitting: Neurology

## 2022-11-05 MED ORDER — VITAMIN D (ERGOCALCIFEROL) 1.25 MG (50000 UNIT) PO CAPS: 50000.0000 [IU] | ORAL_CAPSULE | ORAL | 3 refills | Status: DC

## 2022-11-05 NOTE — Telephone Encounter (Signed)
Pt scheduled for 120 mins MR brain & cervical w/wo contrast at [facility] for 11/06/22 at [facility] auth # [id number] (11/04/22-12/03/22)

## 2022-11-06 ENCOUNTER — Ambulatory Visit (INDEPENDENT_AMBULATORY_CARE_PROVIDER_SITE_OTHER): Payer: BC Managed Care – PPO

## 2022-11-06 DIAGNOSIS — M6281 Muscle weakness (generalized): Secondary | ICD-10-CM | POA: Diagnosis not present

## 2022-11-06 DIAGNOSIS — H538 Other visual disturbances: Secondary | ICD-10-CM

## 2022-11-06 DIAGNOSIS — H5350 Unspecified color vision deficiencies: Secondary | ICD-10-CM

## 2022-11-06 DIAGNOSIS — G1229 Other motor neuron disease: Secondary | ICD-10-CM

## 2022-11-06 DIAGNOSIS — R4189 Other symptoms and signs involving cognitive functions and awareness: Secondary | ICD-10-CM

## 2022-11-06 DIAGNOSIS — M6250 Muscle wasting and atrophy, not elsewhere classified, unspecified site: Secondary | ICD-10-CM | POA: Diagnosis not present

## 2022-11-06 DIAGNOSIS — G8929 Other chronic pain: Secondary | ICD-10-CM

## 2022-11-06 DIAGNOSIS — R748 Abnormal levels of other serum enzymes: Secondary | ICD-10-CM

## 2022-11-06 DIAGNOSIS — R292 Abnormal reflex: Secondary | ICD-10-CM

## 2022-11-06 DIAGNOSIS — R200 Anesthesia of skin: Secondary | ICD-10-CM

## 2022-11-06 DIAGNOSIS — M5412 Radiculopathy, cervical region: Secondary | ICD-10-CM

## 2022-11-06 DIAGNOSIS — R519 Headache, unspecified: Secondary | ICD-10-CM

## 2022-11-06 DIAGNOSIS — R208 Other disturbances of skin sensation: Secondary | ICD-10-CM

## 2022-11-06 DIAGNOSIS — M791 Myalgia, unspecified site: Secondary | ICD-10-CM

## 2022-11-06 DIAGNOSIS — M2550 Pain in unspecified joint: Secondary | ICD-10-CM

## 2022-11-06 DIAGNOSIS — M6289 Other specified disorders of muscle: Secondary | ICD-10-CM

## 2022-11-06 MED ORDER — GADOBENATE DIMEGLUMINE 529 MG/ML IV SOLN
20.0000 mL | Freq: Once | INTRAVENOUS | Status: AC | PRN
Start: 2022-11-06 — End: 2022-11-06
  Administered 2022-11-06: 20 mL via INTRAVENOUS

## 2022-11-07 ENCOUNTER — Encounter: Payer: BC Managed Care – PPO | Admitting: Neurology

## 2022-11-07 DIAGNOSIS — H119 Unspecified disorder of conjunctiva: Secondary | ICD-10-CM | POA: Diagnosis not present

## 2022-11-07 DIAGNOSIS — D234 Other benign neoplasm of skin of scalp and neck: Secondary | ICD-10-CM | POA: Diagnosis not present

## 2022-11-07 DIAGNOSIS — H0279 Other degenerative disorders of eyelid and periocular area: Secondary | ICD-10-CM | POA: Diagnosis not present

## 2022-11-11 ENCOUNTER — Ambulatory Visit (INDEPENDENT_AMBULATORY_CARE_PROVIDER_SITE_OTHER): Payer: BC Managed Care – PPO | Admitting: Neurology

## 2022-11-11 ENCOUNTER — Ambulatory Visit (INDEPENDENT_AMBULATORY_CARE_PROVIDER_SITE_OTHER): Payer: Self-pay | Admitting: Neurology

## 2022-11-11 DIAGNOSIS — M2550 Pain in unspecified joint: Secondary | ICD-10-CM

## 2022-11-11 DIAGNOSIS — Z0289 Encounter for other administrative examinations: Secondary | ICD-10-CM

## 2022-11-11 DIAGNOSIS — M791 Myalgia, unspecified site: Secondary | ICD-10-CM

## 2022-11-11 DIAGNOSIS — M779 Enthesopathy, unspecified: Secondary | ICD-10-CM

## 2022-11-11 MED ORDER — RIZATRIPTAN BENZOATE 10 MG PO TBDP: 10.0000 mg | ORAL_TABLET | ORAL | 11 refills | Status: DC | PRN

## 2022-11-11 NOTE — Procedures (Signed)
Full Name: Tyler Dean Gender: Male MRN #: [id number] Date of Birth: 30-Nov-1985    Visit Date: 11/11/2022 10:03 Age: 37 Years Examining Physician: Dr. Naomie Dean Referring Physician: Dr. Naomie Dean Height: 6 feet 0 inch  History: Myalgias and arthralgias  Summary: NCS performed on the bilateral uppers and right lower. All nerves and muscles (as indicated in the following tables) were within normal limits.   EMG performed on the right upper and right lower: muscles (as indicated in the following tables) were within normal limits.        Conclusion: This is a normal study. No electrophysiologic evidence for mononeuropathy, polyneuropathy, lumbar or cervical radiculopathy or neurogenic muscle disease.     ------------------------------- [name], M.D.  [facility] [address] [place], [place] [address] Tel: [phone] Fax: [phone]  Verbal informed consent was obtained from the patient, patient was informed of potential risk of procedure, including bruising, bleeding, hematoma formation, infection, muscle weakness, muscle pain, numbness, among others.        MNC    Nerve / Sites Muscle Latency Ref. Amplitude Ref. Rel Amp Segments Distance Velocity Ref. Area    ms ms mV mV %  cm m/s m/s mVms  R Median - APB     Wrist APB 3.3 ?4.4 8.9 ?4.0 100 Wrist - APB 7   35.4     Upper arm APB 8.0  8.6  96.6 Upper arm - Wrist 27 57 ?49 34.4  L Median - APB     Wrist APB 3.6 ?4.4 7.6 ?4.0 100 Wrist - APB 7   31.6     Upper arm APB 7.9  7.5  99 Upper arm - Wrist 23 53 ?49 31.5  R Ulnar - ADM     Wrist ADM 1.7 ?3.3 10.7 ?6.0 100 Wrist - ADM 7   37.8     B.Elbow ADM 4.5  8.6  80.8 B.Elbow - Wrist 14 51 ?49 35.0     A.Elbow ADM 7.8  9.2  107 A.Elbow - B.Elbow 18 53 ?49 37.1  L Ulnar - ADM     Wrist ADM 2.6 ?3.3 6.8 ?6.0 100 Wrist - ADM 7   31.1     B.Elbow ADM 4.7  7.1  104 B.Elbow - Wrist 12 58 ?49 26.1     A.Elbow ADM 7.9  7.8  110  A.Elbow - B.Elbow 18 56 ?49 28.9  R Peroneal - EDB     Ankle EDB 4.7 ?6.5 5.8 ?2.0 100 Ankle - EDB 9   17.0     Fib head EDB 11.4  4.8  83.5 Fib head - Ankle 32 48 ?44 16.5     Pop fossa EDB 13.6  4.7  98.5 Pop fossa - Fib head 12 54 ?44 16.7         Pop fossa - Ankle      R Tibial - AH     Ankle AH 3.6 ?5.8 15.2 ?4.0 100 Ankle - AH 9   43.8     Pop fossa AH 12.4  11.8  77.5 Pop fossa - Ankle 40 46 ?41 38.9  R Ulnar - FDI     Wrist FDI 2.8 ?4.5 14.4 ?7.0 100 Wrist - FDI 8   36.9     B.Elbow FDI 4.8  13.4  93.2 B.Elbow - Wrist 14 56 ?49 34.8     A.Elbow FDI 8.0  14.0  104 A.Elbow - B.Elbow 18 55 ?  49 37.3         A.Elbow - Wrist                       SNC    Nerve / Sites Rec. Site Peak Lat Ref.  Amp Ref. Segments Distance Peak Diff Ref.    ms ms V V  cm ms ms  R Radial - Anatomical snuff box (Forearm)     Forearm Wrist 2.4 ?2.9 25 ?15 Forearm - Wrist 10    L Radial - Anatomical snuff box (Forearm)     Forearm Wrist 2.4 ?2.9 28 ?15 Forearm - Wrist 10    R Sural - Ankle (Calf)     Calf Ankle 4.0 ?4.4 14 ?6 Calf - Ankle 14    R Superficial peroneal - Ankle     Lat leg Ankle 2.7 ?4.4 8 ?6 Lat leg - Ankle 14    R Median, Ulnar - Transcarpal comparison     Median Palm Wrist 2.0 ?2.2 59 ?35 Median Palm - Wrist 8       Ulnar Palm Wrist 1.8 ?2.2 14 ?12 Ulnar Palm - Wrist 8          Median Palm - Ulnar Palm  0.2 ?0.4  L Median, Ulnar - Transcarpal comparison     Median Palm Wrist 1.9 ?2.2 43 ?35 Median Palm - Wrist 8       Ulnar Palm Wrist 1.9 ?2.2 19 ?12 Ulnar Palm - Wrist 8          Median Palm - Ulnar Palm  0.1 ?0.4  R Median - Orthodromic (Dig II, Mid palm)     Dig II Wrist 3.0 ?3.4 16 ?10 Dig II - Wrist 13    L Median - Orthodromic (Dig II, Mid palm)     Dig II Wrist 3.2 ?3.4 16 ?10 Dig II - Wrist 13    R Ulnar - Orthodromic, (Dig V, Mid palm)     Dig V Wrist 2.7 ?3.1 9 ?5 Dig V - Wrist 11    L Ulnar - Orthodromic, (Dig V, Mid palm)     Dig V Wrist 3.1 ?3.1 10 ?5 Dig V - Wrist 33                            F  Wave    Nerve F Lat Ref.   ms ms  R Tibial - AH 51.7 ?56.0  R Ulnar - ADM 28.9 ?32.0  L Ulnar - ADM 29.0 ?32.0           EMG Summary Table    Spontaneous MUAP Recruitment  Muscle IA Fib PSW Fasc Other Amp Dur. Poly Pattern  R. Deltoid Normal None None None _______ Normal Normal Normal Normal  R. Triceps brachii Normal None None None _______ Normal Normal Normal Normal  R. Biceps brachii Normal None None None _______ Normal Normal Normal Normal  R. First dorsal interosseous Normal None None None _______ Normal Normal Normal Normal  R. Pronator teres Normal None None None _______ Normal Normal Normal Normal  R. Iliopsoas Normal None None None _______ Normal Normal Normal Normal  R. Vastus medialis Normal None None None _______ Normal Normal Normal Normal  R. Tibialis anterior Normal None None None _______ Normal Normal Normal Normal  R. Gastrocnemius (Medial head) Normal None None None _______ Normal Normal Normal Normal  R. Abductor hallucis Normal None None None _______  Normal Normal Normal Normal

## 2022-11-11 NOTE — Progress Notes (Signed)
Full Name: Tyler Dean Gender: Male MRN #: [id number] Date of Birth: 30-Nov-1985    Visit Date: 11/11/2022 10:03 Age: 37 Years Examining Physician: Dr. Naomie Dean Referring Physician: Dr. Naomie Dean Height: 6 feet 0 inch  History: Myalgias and arthralgias  Summary: NCS performed on the bilateral uppers and right lower. All nerves and muscles (as indicated in the following tables) were within normal limits.   EMG performed on the right upper and right lower: muscles (as indicated in the following tables) were within normal limits.        Conclusion: This is a normal study. No electrophysiologic evidence for mononeuropathy, polyneuropathy, lumbar or cervical radiculopathy or neurogenic muscle disease.     ------------------------------- [name], M.D.  [facility] [address] [place], [place] [address] Tel: [phone] Fax: [phone]  Verbal informed consent was obtained from the patient, patient was informed of potential risk of procedure, including bruising, bleeding, hematoma formation, infection, muscle weakness, muscle pain, numbness, among others.        MNC    Nerve / Sites Muscle Latency Ref. Amplitude Ref. Rel Amp Segments Distance Velocity Ref. Area    ms ms mV mV %  cm m/s m/s mVms  R Median - APB     Wrist APB 3.3 ?4.4 8.9 ?4.0 100 Wrist - APB 7   35.4     Upper arm APB 8.0  8.6  96.6 Upper arm - Wrist 27 57 ?49 34.4  L Median - APB     Wrist APB 3.6 ?4.4 7.6 ?4.0 100 Wrist - APB 7   31.6     Upper arm APB 7.9  7.5  99 Upper arm - Wrist 23 53 ?49 31.5  R Ulnar - ADM     Wrist ADM 1.7 ?3.3 10.7 ?6.0 100 Wrist - ADM 7   37.8     B.Elbow ADM 4.5  8.6  80.8 B.Elbow - Wrist 14 51 ?49 35.0     A.Elbow ADM 7.8  9.2  107 A.Elbow - B.Elbow 18 53 ?49 37.1  L Ulnar - ADM     Wrist ADM 2.6 ?3.3 6.8 ?6.0 100 Wrist - ADM 7   31.1     B.Elbow ADM 4.7  7.1  104 B.Elbow - Wrist 12 58 ?49 26.1     A.Elbow ADM 7.9  7.8  110  A.Elbow - B.Elbow 18 56 ?49 28.9  R Peroneal - EDB     Ankle EDB 4.7 ?6.5 5.8 ?2.0 100 Ankle - EDB 9   17.0     Fib head EDB 11.4  4.8  83.5 Fib head - Ankle 32 48 ?44 16.5     Pop fossa EDB 13.6  4.7  98.5 Pop fossa - Fib head 12 54 ?44 16.7         Pop fossa - Ankle      R Tibial - AH     Ankle AH 3.6 ?5.8 15.2 ?4.0 100 Ankle - AH 9   43.8     Pop fossa AH 12.4  11.8  77.5 Pop fossa - Ankle 40 46 ?41 38.9  R Ulnar - FDI     Wrist FDI 2.8 ?4.5 14.4 ?7.0 100 Wrist - FDI 8   36.9     B.Elbow FDI 4.8  13.4  93.2 B.Elbow - Wrist 14 56 ?49 34.8     A.Elbow FDI 8.0  14.0  104 A.Elbow - B.Elbow 18 55 ?  49 37.3         A.Elbow - Wrist                       SNC    Nerve / Sites Rec. Site Peak Lat Ref.  Amp Ref. Segments Distance Peak Diff Ref.    ms ms V V  cm ms ms  R Radial - Anatomical snuff box (Forearm)     Forearm Wrist 2.4 ?2.9 25 ?15 Forearm - Wrist 10    L Radial - Anatomical snuff box (Forearm)     Forearm Wrist 2.4 ?2.9 28 ?15 Forearm - Wrist 10    R Sural - Ankle (Calf)     Calf Ankle 4.0 ?4.4 14 ?6 Calf - Ankle 14    R Superficial peroneal - Ankle     Lat leg Ankle 2.7 ?4.4 8 ?6 Lat leg - Ankle 14    R Median, Ulnar - Transcarpal comparison     Median Palm Wrist 2.0 ?2.2 59 ?35 Median Palm - Wrist 8       Ulnar Palm Wrist 1.8 ?2.2 14 ?12 Ulnar Palm - Wrist 8          Median Palm - Ulnar Palm  0.2 ?0.4  L Median, Ulnar - Transcarpal comparison     Median Palm Wrist 1.9 ?2.2 43 ?35 Median Palm - Wrist 8       Ulnar Palm Wrist 1.9 ?2.2 19 ?12 Ulnar Palm - Wrist 8          Median Palm - Ulnar Palm  0.1 ?0.4  R Median - Orthodromic (Dig II, Mid palm)     Dig II Wrist 3.0 ?3.4 16 ?10 Dig II - Wrist 13    L Median - Orthodromic (Dig II, Mid palm)     Dig II Wrist 3.2 ?3.4 16 ?10 Dig II - Wrist 13    R Ulnar - Orthodromic, (Dig V, Mid palm)     Dig V Wrist 2.7 ?3.1 9 ?5 Dig V - Wrist 11    L Ulnar - Orthodromic, (Dig V, Mid palm)     Dig V Wrist 3.1 ?3.1 10 ?5 Dig V - Wrist 33                            F  Wave    Nerve F Lat Ref.   ms ms  R Tibial - AH 51.7 ?56.0  R Ulnar - ADM 28.9 ?32.0  L Ulnar - ADM 29.0 ?32.0           EMG Summary Table    Spontaneous MUAP Recruitment  Muscle IA Fib PSW Fasc Other Amp Dur. Poly Pattern  R. Deltoid Normal None None None _______ Normal Normal Normal Normal  R. Triceps brachii Normal None None None _______ Normal Normal Normal Normal  R. Biceps brachii Normal None None None _______ Normal Normal Normal Normal  R. First dorsal interosseous Normal None None None _______ Normal Normal Normal Normal  R. Pronator teres Normal None None None _______ Normal Normal Normal Normal  R. Iliopsoas Normal None None None _______ Normal Normal Normal Normal  R. Vastus medialis Normal None None None _______ Normal Normal Normal Normal  R. Tibialis anterior Normal None None None _______ Normal Normal Normal Normal  R. Gastrocnemius (Medial head) Normal None None None _______ Normal Normal Normal Normal  R. Abductor hallucis Normal None None None _______  Normal Normal Normal Normal

## 2022-11-14 LAB — TRANS. GROWTH FACT. BETA 1: Trans. Growth Fact. beta 1: 11955 pg/mL (ref 2537–22306)

## 2022-11-16 LAB — ANCA PROFILE
Anti-MPO Antibodies: <0.2 units (ref 0.0–0.9)
Anti-PR3 Antibodies: <0.2 units (ref 0.0–0.9)
Atypical pANCA: <1:20 {titer}
C-ANCA: <1:20 {titer}
P-ANCA: <1:20 {titer}

## 2022-11-16 LAB — VEGF, SERUM: VEGF, Serum: 535 pg/mL (ref 62–707)

## 2022-11-16 LAB — VASOACTIVE INTESTINAL PEPTIDE (VIP)

## 2022-11-18 LAB — MMP-9 (MATRIX METALLOPROT.-9): MMP9: 1281 ng/mL — ABNORMAL HIGH

## 2022-11-25 ENCOUNTER — Encounter: Payer: BC Managed Care – PPO | Admitting: Neurology

## 2022-11-26 ENCOUNTER — Other Ambulatory Visit: Payer: Self-pay | Admitting: Cardiology

## 2022-11-28 ENCOUNTER — Encounter: Payer: Self-pay | Admitting: Nurse Practitioner

## 2022-11-28 ENCOUNTER — Ambulatory Visit: Payer: BC Managed Care – PPO | Attending: Nurse Practitioner | Admitting: Nurse Practitioner

## 2022-11-28 VITALS — BP 141/107 | HR 92 | Ht 72.0 in | Wt 218.8 lb

## 2022-11-28 DIAGNOSIS — I10 Essential (primary) hypertension: Secondary | ICD-10-CM | POA: Diagnosis not present

## 2022-11-28 DIAGNOSIS — M7731 Calcaneal spur, right foot: Secondary | ICD-10-CM | POA: Diagnosis not present

## 2022-11-28 DIAGNOSIS — E785 Hyperlipidemia, unspecified: Secondary | ICD-10-CM | POA: Diagnosis not present

## 2022-11-28 DIAGNOSIS — I77810 Thoracic aortic ectasia: Secondary | ICD-10-CM

## 2022-11-28 DIAGNOSIS — Z720 Tobacco use: Secondary | ICD-10-CM

## 2022-11-28 DIAGNOSIS — M79671 Pain in right foot: Secondary | ICD-10-CM | POA: Diagnosis not present

## 2022-11-28 DIAGNOSIS — M7732 Calcaneal spur, left foot: Secondary | ICD-10-CM | POA: Diagnosis not present

## 2022-11-28 DIAGNOSIS — R0609 Other forms of dyspnea: Secondary | ICD-10-CM | POA: Diagnosis not present

## 2022-11-28 DIAGNOSIS — M722 Plantar fascial fibromatosis: Secondary | ICD-10-CM | POA: Diagnosis not present

## 2022-11-28 MED ORDER — LOSARTAN POTASSIUM 100 MG PO TABS: 100.0000 mg | ORAL_TABLET | Freq: Every day | ORAL | 3 refills | Status: DC

## 2022-11-28 MED ORDER — REPATHA SURECLICK 140 MG/ML ~~LOC~~ SOAJ: 140.0000 mg | SUBCUTANEOUS | 5 refills | Status: DC

## 2022-11-28 NOTE — Progress Notes (Signed)
Cardiology Office Note:  .   Date:  11/28/2022  ID:  Tyler Dean, DOB 1985/11/20, MRN [id number] PCP: Lovey Newcomer, PA  [facility] Providers Cardiologist:  Nona Dell, MD    History of Present Illness: Tyler Dean is a 37 y.o. male with a PMH of hypertension, hyperlipidemia, borderline dilated aortic root, migraines, and ADHD, who presents today for 1 year follow-up.  Last seen by Dr. Diona Browner on Aug 29, 2021.  He was overall doing well from a cardiac perspective.  Admitted 12/2021 for rhabdomyolysis secondary to excessive exercise/working out. Improved with IV fluids.   ED visit 06/2022 for atypical CP. 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  Today he presents for follow-up.  He states he is doing well.  He has been having myalgias that he states his medical team says is not related to Repatha.  He has been having thorough workup done to see if he has an autoimmune condition.  Blood pressure is elevated in office today as he has not taken his blood pressure medication this AM.  Admits to consistent shortness of breath, feels as though he is not able to take a full complete breath, attributes this to his smoking habit.  Smokes half pack per day, he is looking to quit. Denies any chest pain, palpitations, syncope, presyncope, dizziness, orthopnea, PND, swelling or significant weight changes, acute bleeding, or claudication.   Studies Reviewed: Marland Kitchen    Lexiscan 01/2021:   The study is normal. The study is low risk.   No ST deviation was noted. The ECG was negative for ischemia.   LV perfusion is normal.   Left ventricular function is normal. Nuclear stress EF: 56 %.   Low risk study with no significant myocardial perfusion defects to indicate ischemia and normal LVEF of 56%.   Echocardiogram 12/2020: 1.  Left ventricular EF 55 to  60%.  Left ventricle is normal function.  Left ventricle has no regional wall motion abnormalities.  Left ventricular diastolic parameters are normal. 2.  Right ventricular systolic function is normal.  Right ventricular size is normal.  Tricuspid regurgitation signal is inadequate for assessing PA pressure. 3.  Mitral valve is grossly normal.  Trivial mitral valve regurgitation. 4.  Aortic valve is tricuspid.  Aortic valve regurgitation is not visualized. 5.  Aortic dilatation noted.  There is borderline dilatation of aortic root, measuring 39 mm. 6.  The inferior vena cava is normal in size with greater than 50% respiratory variability, suggesting right atrial pressure of 3 mmHg.  Risk Assessment/Calculations:    HYPERTENSION CONTROL Vitals:   11/28/22 0909 11/28/22 0914 11/28/22 0950  BP: (!) 148/110 (!) 146/110 (!) 141/107    The patient's blood pressure is elevated above target today.  In order to address the patient's elevated BP: Blood pressure will be monitored at home to determine if medication changes need to be made.; Follow up with general cardiology has been recommended.          Physical Exam:   VS:  BP (!) 141/107 (BP Location: Right Arm, Patient Position: Sitting, Cuff Size: Normal)   Pulse 92   Ht 6' (1.829 m)   Wt 218 lb 12.8 oz (99.2 kg)   SpO2 94%   BMI 29.67 kg/m    Wt Readings from Last 3 Encounters:  11/28/22 218 lb 12.8 oz (99.2 kg)  10/28/22 219 lb (  99.3 kg)  08/06/22 227 lb (103 kg)    GEN: Well nourished, well developed in no acute distress NECK: No JVD; No carotid bruits CARDIAC: S1/S2, RRR, no murmurs, rubs, gallops RESPIRATORY:  Clear to auscultation without rales, wheezing or rhonchi  ABDOMEN: Soft, non-tender, non-distended EXTREMITIES:  No edema; No deformity   ASSESSMENT AND PLAN: .    DOE Etiology unclear, however he attributes this to his smoking habit.  Echocardiogram in 2022 revealed EF 55 to 60% without RWMA.  Lexiscan in 2022 was low  risk and normal.  Smoking cessation encouraged and discussed-see #5.  Will update echocardiogram at this time.  ED precautions discussed.  HTN Blood pressure elevated today-has not taken BP medicine this morning.  BP normally well-controlled at home.  Discussed to monitor BP at home at least 2 hours after medications and sitting for 5-10 minutes.  Continue losartan.  He will contact our office if SBP remains > 130. Heart healthy diet and regular cardiovascular exercise encouraged.  Will refill losartan for him.  HLD He will be getting labs through his employer in October.  He politely defers blood work to his employer at this time.  Will refill Repatha for him. Heart healthy diet and regular cardiovascular exercise encouraged.   Aortic root dilatation Borderline dilatation of aortic root, measuring 39 mm on echocardiogram in 2022.  Will update echocardiogram at this time to monitor this as mentioned above.  Tobacco abuse Smoking cessation encouraged and discussed.  Dispo: Follow-up with me in 3 to 4 months or sooner if anything changes.  Signed, Sharlene Dory, NP

## 2022-11-28 NOTE — Patient Instructions (Addendum)
Medication Instructions:  Your physician recommends that you continue on your current medications as directed. Please refer to the Current Medication list given to you today.  Labwork: None  Testing/Procedures: Your physician has requested that you have an echocardiogram. Echocardiography is a painless test that uses sound waves to create images of your heart. It provides your doctor with information about the size and shape of your heart and how well your heart's chambers and valves are working. This procedure takes approximately one hour. There are no restrictions for this procedure. Please do NOT wear cologne, perfume, aftershave, or lotions (deodorant is allowed). Please arrive 15 minutes prior to your appointment time.  Follow-Up: Your physician recommends that you schedule a follow-up appointment in: 3-4 Months [place]   Any Other Special Instructions Will Be Listed Below (If Applicable).  If you need a refill on your cardiac medications before your next appointment, please call your pharmacy.

## 2022-12-02 DIAGNOSIS — M79671 Pain in right foot: Secondary | ICD-10-CM | POA: Diagnosis not present

## 2022-12-02 DIAGNOSIS — M722 Plantar fascial fibromatosis: Secondary | ICD-10-CM | POA: Diagnosis not present

## 2022-12-02 DIAGNOSIS — M7732 Calcaneal spur, left foot: Secondary | ICD-10-CM | POA: Diagnosis not present

## 2022-12-02 DIAGNOSIS — M7731 Calcaneal spur, right foot: Secondary | ICD-10-CM | POA: Diagnosis not present

## 2022-12-03 ENCOUNTER — Ambulatory Visit: Payer: BC Managed Care – PPO

## 2022-12-03 DIAGNOSIS — I77810 Thoracic aortic ectasia: Secondary | ICD-10-CM

## 2022-12-05 ENCOUNTER — Other Ambulatory Visit: Payer: Self-pay | Admitting: *Deleted

## 2022-12-05 ENCOUNTER — Telehealth: Payer: Self-pay | Admitting: Nurse Practitioner

## 2022-12-05 MED ORDER — REPATHA SURECLICK 140 MG/ML ~~LOC~~ SOAJ: 140.0000 mg | SUBCUTANEOUS | 6 refills | Status: DC

## 2022-12-05 NOTE — Telephone Encounter (Signed)
Mail box full and unable to leave message. Will send mychart message with response.

## 2022-12-05 NOTE — Telephone Encounter (Signed)
Pt c/o medication issue:  1. Name of Medication:   Evolocumab (REPATHA SURECLICK) 140 MG/ML SOAJ    2. How are you currently taking this medication (dosage and times per day)? N/A  3. Are you having a reaction (difficulty breathing--STAT)? No   4. What is your medication issue? Pharmacy advised they have not received prescription  Patient is also wanting a call for his Echo results once they have been interpreted by the provider.   Please advise.

## 2022-12-11 ENCOUNTER — Other Ambulatory Visit: Payer: Self-pay

## 2022-12-11 DIAGNOSIS — D170 Benign lipomatous neoplasm of skin and subcutaneous tissue of head, face and neck: Secondary | ICD-10-CM | POA: Diagnosis not present

## 2022-12-11 DIAGNOSIS — D234 Other benign neoplasm of skin of scalp and neck: Secondary | ICD-10-CM | POA: Diagnosis not present

## 2022-12-11 DIAGNOSIS — E882 Lipomatosis, not elsewhere classified: Secondary | ICD-10-CM | POA: Diagnosis not present

## 2022-12-16 DIAGNOSIS — H60331 Swimmer's ear, right ear: Secondary | ICD-10-CM | POA: Diagnosis not present

## 2022-12-16 DIAGNOSIS — J343 Hypertrophy of nasal turbinates: Secondary | ICD-10-CM | POA: Diagnosis not present

## 2022-12-16 DIAGNOSIS — J342 Deviated nasal septum: Secondary | ICD-10-CM | POA: Diagnosis not present

## 2022-12-16 DIAGNOSIS — R42 Dizziness and giddiness: Secondary | ICD-10-CM | POA: Diagnosis not present

## 2022-12-16 DIAGNOSIS — J310 Chronic rhinitis: Secondary | ICD-10-CM | POA: Diagnosis not present

## 2022-12-19 DIAGNOSIS — M7732 Calcaneal spur, left foot: Secondary | ICD-10-CM | POA: Diagnosis not present

## 2022-12-19 DIAGNOSIS — M7731 Calcaneal spur, right foot: Secondary | ICD-10-CM | POA: Diagnosis not present

## 2022-12-19 DIAGNOSIS — M722 Plantar fascial fibromatosis: Secondary | ICD-10-CM | POA: Diagnosis not present

## 2022-12-19 DIAGNOSIS — M79671 Pain in right foot: Secondary | ICD-10-CM | POA: Diagnosis not present

## 2022-12-26 DIAGNOSIS — D1730 Benign lipomatous neoplasm of skin and subcutaneous tissue of unspecified sites: Secondary | ICD-10-CM | POA: Diagnosis not present

## 2022-12-26 DIAGNOSIS — H0279 Other degenerative disorders of eyelid and periocular area: Secondary | ICD-10-CM | POA: Diagnosis not present

## 2022-12-31 ENCOUNTER — Ambulatory Visit: Admission: EM | Admit: 2022-12-31 | Payer: BC Managed Care – PPO | Source: Home / Self Care

## 2023-01-01 ENCOUNTER — Other Ambulatory Visit: Payer: Self-pay

## 2023-01-01 ENCOUNTER — Encounter (HOSPITAL_COMMUNITY): Payer: Self-pay

## 2023-01-01 ENCOUNTER — Emergency Department (HOSPITAL_COMMUNITY): Payer: BC Managed Care – PPO

## 2023-01-01 ENCOUNTER — Emergency Department (HOSPITAL_COMMUNITY)
Admission: EM | Admit: 2023-01-01 | Discharge: 2023-01-01 | Disposition: A | Discharge status: Home / Self Care | Payer: BC Managed Care – PPO | Attending: Emergency Medicine | Admitting: Emergency Medicine

## 2023-01-01 DIAGNOSIS — K5730 Diverticulosis of large intestine without perforation or abscess without bleeding: Secondary | ICD-10-CM | POA: Diagnosis not present

## 2023-01-01 DIAGNOSIS — R109 Unspecified abdominal pain: Secondary | ICD-10-CM

## 2023-01-01 DIAGNOSIS — N281 Cyst of kidney, acquired: Secondary | ICD-10-CM | POA: Diagnosis not present

## 2023-01-01 DIAGNOSIS — Z79899 Other long term (current) drug therapy: Secondary | ICD-10-CM | POA: Diagnosis not present

## 2023-01-01 DIAGNOSIS — R1031 Right lower quadrant pain: Secondary | ICD-10-CM | POA: Insufficient documentation

## 2023-01-01 DIAGNOSIS — R1032 Left lower quadrant pain: Secondary | ICD-10-CM | POA: Insufficient documentation

## 2023-01-01 DIAGNOSIS — E871 Hypo-osmolality and hyponatremia: Secondary | ICD-10-CM | POA: Diagnosis not present

## 2023-01-01 DIAGNOSIS — R10A0 Flank pain, unspecified side: Secondary | ICD-10-CM

## 2023-01-01 DIAGNOSIS — I10 Essential (primary) hypertension: Secondary | ICD-10-CM | POA: Insufficient documentation

## 2023-01-01 LAB — URINALYSIS, ROUTINE W REFLEX MICROSCOPIC
Bilirubin Urine: NEGATIVE
Glucose, UA: NEGATIVE mg/dL
Hgb urine dipstick: NEGATIVE
Ketones, ur: NEGATIVE mg/dL
Leukocytes,Ua: NEGATIVE
Nitrite: NEGATIVE
Protein, ur: NEGATIVE mg/dL
Specific Gravity, Urine: 1.01 (ref 1.005–1.030)
pH: 7 (ref 5.0–8.0)

## 2023-01-01 LAB — CBC
HCT: 46.3 % (ref 39.0–52.0)
Hemoglobin: 15.6 g/dL (ref 13.0–17.0)
MCH: 30.9 pg (ref 26.0–34.0)
MCHC: 33.7 g/dL (ref 30.0–36.0)
MCV: 91.7 fL (ref 80.0–100.0)
Platelets: 374 10*3/uL (ref 150–400)
RBC: 5.05 MIL/uL (ref 4.22–5.81)
RDW: 12.3 % (ref 11.5–15.5)
WBC: 8.3 10*3/uL (ref 4.0–10.5)
nRBC: 0 % (ref 0.0–0.2)

## 2023-01-01 LAB — BASIC METABOLIC PANEL
Anion gap: 11 (ref 5–15)
BUN: 15 mg/dL (ref 6–20)
CO2: 25 mmol/L (ref 22–32)
Calcium: 9.1 mg/dL (ref 8.9–10.3)
Chloride: 98 mmol/L (ref 98–111)
Creatinine, Ser: 0.89 mg/dL (ref 0.61–1.24)
GFR, Estimated: >60 mL/min (ref >60)
Glucose, Bld: 134 mg/dL — ABNORMAL HIGH (ref 70–99)
Potassium: 3.8 mmol/L (ref 3.5–5.1)
Sodium: 134 mmol/L — ABNORMAL LOW (ref 135–145)

## 2023-01-01 LAB — CK: Total CK: 64 U/L (ref 49–397)

## 2023-01-01 MED ORDER — IBUPROFEN 600 MG PO TABS
600.0000 mg | ORAL_TABLET | Freq: Three times a day (TID) | ORAL | 0 refills | Status: AC | PRN
Start: 2023-01-01 — End: ?

## 2023-01-01 NOTE — ED Triage Notes (Addendum)
C/o left foot and ankle numbness x 1 day with nausea and bilateral flank pain, tender to touch  Pt also reports pain with urination.

## 2023-01-01 NOTE — ED Provider Notes (Signed)
[place] EMERGENCY DEPARTMENT AT [facility] Provider Note   CSN: [id number] Arrival date & time: 01/01/23  1504     History  Chief Complaint  Patient presents with   Flank Pain    Tyler Dean is a 37 y.o. male with a history including hypertension, GAD, history of migraine headaches, hyperlipidemia and a significant history of rhabdomyolysis 1 year ago requiring admission here secondary to excessive exercise.  He has not worked out since that occurrence out of fear of developing rhabdo.  However, he went to the gym last Wednesday for the first time, concentrating on upper body work, predominantly his arms and has developed bilateral flank pain along with mild nausea since this workout.  He also states having dysuria, denies hematuria, no history of kidney stones, also denies penile discharge, but endorses a subtle pressure sensation in his bladder area when he urinates.  He denies urinary frequency, also denies fevers or chills.  He had prostatitis years ago, he states his current symptoms do not feel like that condition.  He is most concerned about whether this could represent recurrence of rhabdomyolysis.  He also notes having a tingling sensation in his left foot and ankle since yesterday.  He denies weakness or foot drop, he is able to ambulate without difficulty, denies injury.  He also denies low back pain or radiation of discomfort into his legs.  He has had no treatment for his complaints prior to arrival.  The history is provided by the patient.       Home Medications Prior to Admission medications   Medication Sig Start Date End Date Taking? Authorizing Provider  ibuprofen (ADVIL) 600 MG tablet Take 1 tablet (600 mg total) by mouth every 8 (eight) hours as needed for moderate pain. 01/01/23  Yes Yomaris Palecek, Raynelle Fanning, PA-C  Evolocumab (REPATHA SURECLICK) 140 MG/ML SOAJ Inject 140 mg into the skin every 14 (fourteen) days. 12/05/22   Sharlene Dory, NP  losartan (COZAAR) 100  MG tablet Take 1 tablet (100 mg total) by mouth daily. 11/28/22   Sharlene Dory, NP  rizatriptan (MAXALT-MLT) 10 MG disintegrating tablet Take 1 tablet (10 mg total) by mouth as needed for migraine. May repeat in 2 hours if needed 11/11/22   Anson Fret, MD      Allergies    Atorvastatin, Pravastatin, and Rosuvastatin    Review of Systems   Review of Systems  Constitutional:  Negative for chills and fever.  HENT:  Negative for congestion.   Eyes: Negative.   Respiratory:  Negative for chest tightness and shortness of breath.   Cardiovascular:  Negative for chest pain.  Gastrointestinal:  Positive for nausea. Negative for abdominal pain and vomiting.  Genitourinary:  Positive for dysuria and flank pain. Negative for frequency, hematuria, penile discharge and testicular pain.  Musculoskeletal:  Negative for arthralgias, joint swelling and neck pain.  Skin: Negative.  Negative for rash and wound.  Neurological:  Negative for dizziness, weakness, light-headedness, numbness and headaches.  Psychiatric/Behavioral: Negative.    All other systems reviewed and are negative.   Physical Exam Updated Vital Signs BP (!) 139/91   Pulse 91   Temp 98.5 F (36.9 C)   Resp 18   Ht 6' (1.829 m)   Wt 97.5 kg   SpO2 97%   BMI 29.16 kg/m  Physical Exam Vitals and nursing note reviewed.  Constitutional:      Appearance: He is well-developed.  HENT:     Head: Normocephalic and atraumatic.  Eyes:     Conjunctiva/sclera: Conjunctivae normal.  Cardiovascular:     Rate and Rhythm: Normal rate and regular rhythm.     Heart sounds: Normal heart sounds.  Pulmonary:     Effort: Pulmonary effort is normal.     Breath sounds: Normal breath sounds. No wheezing.  Abdominal:     General: Bowel sounds are normal.     Palpations: Abdomen is soft.     Tenderness: There is abdominal tenderness in the suprapubic area. There is right CVA tenderness and left CVA tenderness.     Comments: Left greater  than right CVA tenderness.    Musculoskeletal:        General: Normal range of motion.     Cervical back: Normal range of motion.  Skin:    General: Skin is warm and dry.  Neurological:     Mental Status: He is alert.     ED Results / Procedures / Treatments   Labs (all labs ordered are listed, but only abnormal results are displayed) Labs Reviewed  BASIC METABOLIC PANEL - Abnormal; Notable for the following components:      Result Value   Sodium 134 (*)    Glucose, Bld 134 (*)    All other components within normal limits  URINALYSIS, ROUTINE W REFLEX MICROSCOPIC  CBC  CK    EKG None  Radiology No results found. Results for orders placed or performed during the hospital encounter of 01/01/23  Urinalysis, Routine w reflex microscopic -Urine, Clean Catch  Result Value Ref Range   Color, Urine YELLOW YELLOW   APPearance CLEAR CLEAR   Specific Gravity, Urine 1.010 1.005 - 1.030   pH 7.0 5.0 - 8.0   Glucose, UA NEGATIVE NEGATIVE mg/dL   Hgb urine dipstick NEGATIVE NEGATIVE   Bilirubin Urine NEGATIVE NEGATIVE   Ketones, ur NEGATIVE NEGATIVE mg/dL   Protein, ur NEGATIVE NEGATIVE mg/dL   Nitrite NEGATIVE NEGATIVE   Leukocytes,Ua NEGATIVE NEGATIVE  CBC  Result Value Ref Range   WBC 8.3 4.0 - 10.5 K/uL   RBC 5.05 4.22 - 5.81 MIL/uL   Hemoglobin 15.6 13.0 - 17.0 g/dL   HCT 16.1 09.6 - 04.5 %   MCV 91.7 80.0 - 100.0 fL   MCH 30.9 26.0 - 34.0 pg   MCHC 33.7 30.0 - 36.0 g/dL   RDW 40.9 81.1 - 91.4 %   Platelets 374 150 - 400 K/uL   nRBC 0.0 0.0 - 0.2 %  Basic metabolic panel  Result Value Ref Range   Sodium 134 (L) 135 - 145 mmol/L   Potassium 3.8 3.5 - 5.1 mmol/L   Chloride 98 98 - 111 mmol/L   CO2 25 22 - 32 mmol/L   Glucose, Bld 134 (H) 70 - 99 mg/dL   BUN 15 6 - 20 mg/dL   Creatinine, Ser 7.82 0.61 - 1.24 mg/dL   Calcium 9.1 8.9 - 95.6 mg/dL   GFR, Estimated >21 >30 mL/min   Anion gap 11 5 - 15  CK  Result Value Ref Range   Total CK 64 49 - 397 U/L   CT  Renal Stone Study  Result Date: 01/01/2023 CLINICAL DATA:  Abdominal/flank pain, stone suspected. Bilateral flank pain with dysuria and nausea for 2 days. EXAM: CT ABDOMEN AND PELVIS WITHOUT CONTRAST TECHNIQUE: Multidetector CT imaging of the abdomen and pelvis was performed following the standard protocol without IV contrast. RADIATION DOSE REDUCTION: This exam was performed according to the departmental dose-optimization program which includes automated  exposure control, adjustment of the mA and/or kV according to patient size and/or use of iterative reconstruction technique. COMPARISON:  CT angio of the abdomen and pelvis 07/02/2022 FINDINGS: Lower chest: Mild dependent atelectasis is present at the right lung base. The heart size is normal. No significant pleural or pericardial effusion is present. Hepatobiliary: No focal liver abnormality is seen. No gallstones, gallbladder wall thickening, or biliary dilatation. Pancreas: Unremarkable. No pancreatic ductal dilatation or surrounding inflammatory changes. Spleen: Normal in size without focal abnormality. Adrenals/Urinary Tract: Adrenal glands are normal bilaterally. Simple cysts are again noted at the lower pole of both kidneys measuring maximally 21 mm on the left and 13 mm on the right. No stone or mass lesion is present. The ureters are within normal limits. No obstruction is present. The urinary bladder is normal. Stomach/Bowel: Stomach and duodenum are within normal limits. The small bowel is unremarkable. Terminal ileum is normal. The appendix is visualized and normal. The ascending and transverse colon are within normal limits. Diverticular changes are present within the distal descending and sigmoid colon without focal inflammation to suggest diverticulitis. Vascular/Lymphatic: No significant vascular findings are present. No enlarged abdominal or pelvic lymph nodes. Reproductive: Prostate is unremarkable. Other: No abdominal wall hernia or abnormality.  No abdominopelvic ascites. Musculoskeletal: No acute or significant osseous findings. IMPRESSION: 1. No acute or focal lesion to explain the patient's symptoms. 2. Descending and sigmoid diverticulosis without diverticulitis. 3. Simple cysts at the lower pole of both kidneys. No follow-up imaging is recommended. JACR 2018 Feb; 264-273, Management of the Incidental Renal Mass on CT, [employer] 2021; 814-848, Bosniak Classification of Cystic Renal Masses, Version 2019. Electronically Signed   By: Marin Roberts M.D.   On: 01/01/2023 18:30    Procedures Procedures    Medications Ordered in ED Medications - No data to display  ED Course/ Medical Decision Making/ A&P                                 Medical Decision Making Patient presenting with bilateral CVA tenderness which is reproducible, he also notes it is worse with movement and certain positions suggesting this could be a musculoskeletal source.  He is most concerned about recurrence of rhabdomyolysis which is not an issue today as his total CK is normal.  He denies risk for STDs, he has had prostatitis in the past, denies symptoms being similar to that infection.  Afebrile, UA is negative.  No history of kidney stones, CT renal negative for stones, pyelonephritis.  Given normal workup and patient has recently returned to working out, is likely his symptoms are secondary to muscle strain from his workout.  He was prescribed ibuprofen, advised follow-up up with this PCP if symptoms persist or worsen.  Amount and/or Complexity of Data Reviewed Labs: ordered.    Details: Urinalysis is negative, total CK is 64, be met relatively normal minimal hyponatremia with a sodium of 134, his glucose is 134, this is not a fasting glucose.  His CBC is normal with a WBC count of 8.3. Radiology: ordered.    Details: CT renal negative.   Risk Prescription drug management.           Final Clinical Impression(s) / ED Diagnoses Final  diagnoses:  Flank pain    Rx / DC Orders ED Discharge Orders          Ordered    ibuprofen (ADVIL) 600 MG tablet  Every  8 hours PRN        01/01/23 1851              Victoriano Lain 01/03/23 2328    Terrilee Files, MD 01/05/23 [id number]

## 2023-01-01 NOTE — Discharge Instructions (Signed)
Your lab tests and CT imaging are all negative for acute findings today.  I suspect your symptoms may be musculoskeletal, especially your back pain as your CT is negative and you do not have rhabdomyolysis.  Your urine is also negative for infection.  I would plan follow-up care with your primary provider if your symptoms persist or not improving with a little bit of time.

## 2023-01-09 DIAGNOSIS — M7732 Calcaneal spur, left foot: Secondary | ICD-10-CM | POA: Diagnosis not present

## 2023-01-09 DIAGNOSIS — M7731 Calcaneal spur, right foot: Secondary | ICD-10-CM | POA: Diagnosis not present

## 2023-01-09 DIAGNOSIS — M722 Plantar fascial fibromatosis: Secondary | ICD-10-CM | POA: Diagnosis not present

## 2023-01-09 DIAGNOSIS — M79671 Pain in right foot: Secondary | ICD-10-CM | POA: Diagnosis not present

## 2023-01-14 DIAGNOSIS — R42 Dizziness and giddiness: Secondary | ICD-10-CM | POA: Diagnosis not present

## 2023-01-16 DIAGNOSIS — D1730 Benign lipomatous neoplasm of skin and subcutaneous tissue of unspecified sites: Secondary | ICD-10-CM | POA: Diagnosis not present

## 2023-01-21 DIAGNOSIS — R42 Dizziness and giddiness: Secondary | ICD-10-CM | POA: Diagnosis not present

## 2023-01-21 DIAGNOSIS — H903 Sensorineural hearing loss, bilateral: Secondary | ICD-10-CM | POA: Diagnosis not present

## 2023-01-21 DIAGNOSIS — J342 Deviated nasal septum: Secondary | ICD-10-CM | POA: Diagnosis not present

## 2023-01-21 DIAGNOSIS — J310 Chronic rhinitis: Secondary | ICD-10-CM | POA: Diagnosis not present

## 2023-01-21 DIAGNOSIS — J343 Hypertrophy of nasal turbinates: Secondary | ICD-10-CM | POA: Diagnosis not present

## 2023-01-30 DIAGNOSIS — M7732 Calcaneal spur, left foot: Secondary | ICD-10-CM | POA: Diagnosis not present

## 2023-01-30 DIAGNOSIS — M79671 Pain in right foot: Secondary | ICD-10-CM | POA: Diagnosis not present

## 2023-01-30 DIAGNOSIS — M722 Plantar fascial fibromatosis: Secondary | ICD-10-CM | POA: Diagnosis not present

## 2023-01-30 DIAGNOSIS — M7731 Calcaneal spur, right foot: Secondary | ICD-10-CM | POA: Diagnosis not present

## 2023-02-10 DIAGNOSIS — U071 COVID-19: Secondary | ICD-10-CM | POA: Diagnosis not present

## 2023-02-10 DIAGNOSIS — R891 Abnormal level of hormones in specimens from other organs, systems and tissues: Secondary | ICD-10-CM | POA: Diagnosis not present

## 2023-02-10 DIAGNOSIS — B488 Other specified mycoses: Secondary | ICD-10-CM | POA: Diagnosis not present

## 2023-02-10 DIAGNOSIS — E291 Testicular hypofunction: Secondary | ICD-10-CM | POA: Diagnosis not present

## 2023-02-17 ENCOUNTER — Ambulatory Visit
Admission: EM | Admit: 2023-02-17 | Discharge: 2023-02-17 | Disposition: A | Discharge status: Home / Self Care | Payer: BC Managed Care – PPO | Attending: Nurse Practitioner | Admitting: Nurse Practitioner

## 2023-02-17 DIAGNOSIS — M791 Myalgia, unspecified site: Secondary | ICD-10-CM | POA: Diagnosis not present

## 2023-02-17 DIAGNOSIS — Z8739 Personal history of other diseases of the musculoskeletal system and connective tissue: Secondary | ICD-10-CM | POA: Diagnosis not present

## 2023-02-17 LAB — POCT URINALYSIS DIP (MANUAL ENTRY)
Bilirubin, UA: NEGATIVE
Blood, UA: NEGATIVE
Glucose, UA: NEGATIVE mg/dL
Ketones, POC UA: NEGATIVE mg/dL
Leukocytes, UA: NEGATIVE
Nitrite, UA: NEGATIVE
Protein Ur, POC: NEGATIVE mg/dL
Spec Grav, UA: 1.015 (ref 1.010–1.025)
Urobilinogen, UA: 0.2 U/dL
pH, UA: 7 (ref 5.0–8.0)

## 2023-02-17 NOTE — Discharge Instructions (Addendum)
The urinalysis does not show any abnormalities. Lab work is pending.  You will be contacted if the pending lab results are abnormal.  You will also have access to results via MyChart. Make sure you are drinking plenty of fluids and getting plenty of rest. Recommend warm Epsom salt soaks to help with muscle soreness. May take over-the-counter Tylenol as needed for pain or discomfort. If you continue to experience symptoms, and your lab work is negative, please follow-up with your primary care physician for further evaluation. Follow-up as needed.

## 2023-02-17 NOTE — ED Provider Notes (Signed)
[facility]    CSN: [id number] Arrival date & time: 02/17/23  1758      History   Chief Complaint Chief Complaint  Patient presents with   Muscle Pain    HPI Tyler Dean is a 37 y.o. male.   The history is provided by the patient.   The patient presents for a 2-day history of muscle soreness in his upper extremities and bilateral flank pain.  He describes the pain in his upper extremities as stiffness. Denies swelling, discoloration, decreased range of motion, rashes or lesions.  States that he did exercise 2 days ago prior to his symptoms starting.  States that he only performed exercises for his upper extremities. States he has a history of rhabdomyolysis and this feels exactly like that so he is certain that he has it. Denies dark urine, diffuse bodyaches, recent injury or immobilization, excessive exercise which was found to be the cause of his known case of rhabdomyolysis.   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   Generalized anxiety disorder 02/27/2016   Contact dermatitis and other eczema 06/13/2015    Past Surgical History:  Procedure Laterality Date   CYSTOSCOPY N/A 04/27/2013   Procedure: CYSTOSCOPY;  Surgeon: Ky Barban, MD;  Location: [facility];  Service: Urology;  Laterality: N/A;   DENTAL SURGERY  05/19/2019   EAR CYST EXCISION N/A 04/27/2013   Procedure: EXCISION OF  SEBACEOUS CYST ON PENIS AND SCROTUM;  Surgeon: Ky Barban, MD;  Location: [facility];  Service: Urology;  Laterality: N/A;   PILONIDAL CYST EXCISION N/A 04/16/2013   Procedure: CYST EXCISION PILONIDAL SIMPLE;  Surgeon: Dalia Heading, MD;  Location: [facility];  Service: General;  Laterality: N/A;       Home Medications    Prior to Admission medications   Medication Sig Start Date End Date Taking? Authorizing Provider  Evolocumab (REPATHA SURECLICK) 140 MG/ML SOAJ Inject 140 mg into the skin every 14 (fourteen) days. 12/05/22  Yes Sharlene Dory, NP  ibuprofen (ADVIL) 600 MG tablet Take 1 tablet (600 mg total) by mouth every 8 (eight) hours as needed for moderate pain. 01/01/23  Yes Idol, Raynelle Fanning, PA-C  losartan (COZAAR) 100 MG tablet Take 1 tablet (100 mg total) by mouth daily. 11/28/22  Yes Sharlene Dory, NP  rizatriptan (MAXALT-MLT) 10 MG disintegrating tablet Take 1 tablet (10 mg total) by mouth as needed for migraine. May repeat in 2 hours if needed 11/11/22  Yes Anson Fret, MD    Family History Family History  Problem Relation Age of Onset   Heart disease Mother    Migraines Mother    Cancer Father    Migraines Brother    Heart disease Maternal Grandmother    Migraines Maternal Grandmother    Heart disease Maternal Grandfather    Migraines Maternal Uncle    Migraines Maternal Uncle    Colon cancer Neg Hx  grandmother with colon cancer   Colon polyps Neg Hx     Social History Social History   Tobacco Use   Smoking status: Every Day    Current packs/day: 0.50    Average packs/day: 0.5 packs/day for 10.0 years (5.0 ttl pk-yrs)    Types: Cigarettes   Smokeless tobacco: Never  Vaping Use   Vaping status: Some Days  Substance Use Topics   Alcohol use: Never   Drug use: Never     Allergies   Atorvastatin, Pravastatin, and Rosuvastatin   Review of Systems Review of Systems Per HPI  Physical Exam Triage Vital Signs ED Triage Vitals  Encounter Vitals  Group     BP 02/17/23 1830 129/79     Systolic BP Percentile --      Diastolic BP Percentile --      Pulse Rate 02/17/23 1830 94     Resp 02/17/23 1830 18     Temp 02/17/23 1830 98.4 F (36.9 C)     Temp Source 02/17/23 1830 Oral     SpO2 02/17/23 1830 94 %     Weight --      Height --      Head Circumference --      Peak Flow --      Pain Score 02/17/23 1834 7     Pain Loc --      Pain Education --      Exclude from Growth Chart --    No data found.  Updated Vital Signs BP 129/79 (BP Location: Right Arm)   Pulse 94   Temp 98.4 F (36.9 C) (Oral)   Resp 18   SpO2 94%   Visual Acuity Right Eye Distance:   Left Eye Distance:   Bilateral Distance:    Right Eye Near:   Left Eye Near:    Bilateral Near:     Physical Exam Vitals and nursing note reviewed.  Constitutional:      General: He is not in acute distress.    Appearance: Normal appearance.  HENT:     Head: Normocephalic.  Eyes:     Extraocular Movements: Extraocular movements intact.     Pupils: Pupils are equal, round, and reactive to light.  Cardiovascular:     Rate and Rhythm: Normal rate.     Pulses: Normal pulses.     Heart sounds: Normal heart sounds.  Pulmonary:     Effort: Pulmonary effort is normal. No respiratory distress.     Breath sounds: Normal breath sounds. No stridor. No wheezing, rhonchi or rales.  Abdominal:     General: Bowel sounds are normal.     Palpations: Abdomen is soft.     Tenderness: There is no abdominal tenderness. There is left CVA tenderness.  Musculoskeletal:     Right upper arm: Tenderness present. No swelling, edema or deformity.     Left upper arm: Tenderness present. No swelling, edema or deformity.     Cervical back: Normal range of motion.     Comments: No joint edema, erythema, diffuse tenderness to palpation to the muscles of the forearm and distal upper arms bilateral upper extremities   Lymphadenopathy:     Cervical: No cervical adenopathy.  Skin:     General: Skin is warm and dry.  Neurological:     General: No focal deficit present.     Mental Status: He is alert and oriented to person, place, and time.  Psychiatric:        Mood and  Affect: Mood normal.        Behavior: Behavior normal.      UC Treatments / Results  Labs (all labs ordered are listed, but only abnormal results are displayed) Labs Reviewed  COMPREHENSIVE METABOLIC PANEL  CK  POCT URINALYSIS DIP (MANUAL ENTRY)    EKG   Radiology No results found.  Procedures Procedures (including critical care time)  Medications Ordered in UC Medications - No data to display  Initial Impression / Assessment and Plan / UC Course  I have reviewed the triage vital signs and the nursing notes.  Pertinent labs & imaging results that were available during my care of the patient were reviewed by me and considered in my medical decision making (see chart for details).  Exam and vital signs benign today, urinalysis without any abnormalities concerning for severe dehydration or rhabdomyolysis. For reassurance per patient request will obtain labs to rule this out further but very low suspicion at this time of rhabdomyolysis. More suspicious for muscular aches, treat with Epsom salt soaks, good hydration, continue over-the-counter pain relievers.  If symptoms fail to improve, recommend following up with his primary care physician for further evaluation.  Patient is in agreement with this plan of care and verbalizes understanding.  All questions were answered.  Patient stable for discharge.  Final Clinical Impressions(s) / UC Diagnoses   Final diagnoses:  Myalgia  History of rhabdomyolysis     Discharge Instructions      The urinalysis does not show any abnormalities. Lab work is pending.  You will be contacted if the pending lab results are abnormal.  You will also have access to results via MyChart. Make sure you are drinking plenty of fluids and getting plenty of  rest. Recommend warm Epsom salt soaks to help with muscle soreness. May take over-the-counter Tylenol as needed for pain or discomfort. If you continue to experience symptoms, and your lab work is negative, please follow-up with your primary care physician for further evaluation. Follow-up as needed.     ED Prescriptions   None    PDMP not reviewed this encounter.   Abran Cantor, NP 02/17/23 1918

## 2023-02-17 NOTE — ED Triage Notes (Signed)
Pt states he was working out Saturday and states he is having muscle pain in his back and arms that are similar to when he had rhabdomyolysis last year. Pt states he would like to have his CK drawn.

## 2023-02-18 LAB — COMPREHENSIVE METABOLIC PANEL
ALT: 51 [IU]/L — ABNORMAL HIGH (ref 0–44)
AST: 24 [IU]/L (ref 0–40)
Albumin: 4.8 g/dL (ref 4.1–5.1)
Alkaline Phosphatase: 70 [IU]/L (ref 44–121)
BUN/Creatinine Ratio: 18 (ref 9–20)
BUN: 15 mg/dL (ref 6–20)
Bilirubin Total: <0.2 mg/dL (ref 0.0–1.2)
CO2: 24 mmol/L (ref 20–29)
Calcium: 9.8 mg/dL (ref 8.7–10.2)
Chloride: 102 mmol/L (ref 96–106)
Creatinine, Ser: 0.85 mg/dL (ref 0.76–1.27)
Globulin, Total: 2.1 g/dL (ref 1.5–4.5)
Glucose: 84 mg/dL (ref 70–99)
Potassium: 4.5 mmol/L (ref 3.5–5.2)
Sodium: 140 mmol/L (ref 134–144)
Total Protein: 6.9 g/dL (ref 6.0–8.5)
eGFR: 115 mL/min/{1.73_m2} (ref >59)

## 2023-02-18 LAB — CK: Total CK: 238 U/L (ref 49–439)

## 2023-02-19 DIAGNOSIS — M62830 Muscle spasm of back: Secondary | ICD-10-CM | POA: Diagnosis not present

## 2023-02-19 DIAGNOSIS — M9905 Segmental and somatic dysfunction of pelvic region: Secondary | ICD-10-CM | POA: Diagnosis not present

## 2023-02-19 DIAGNOSIS — M9903 Segmental and somatic dysfunction of lumbar region: Secondary | ICD-10-CM | POA: Diagnosis not present

## 2023-02-19 DIAGNOSIS — M9902 Segmental and somatic dysfunction of thoracic region: Secondary | ICD-10-CM | POA: Diagnosis not present

## 2023-02-21 DIAGNOSIS — M62830 Muscle spasm of back: Secondary | ICD-10-CM | POA: Diagnosis not present

## 2023-02-21 DIAGNOSIS — M9905 Segmental and somatic dysfunction of pelvic region: Secondary | ICD-10-CM | POA: Diagnosis not present

## 2023-02-21 DIAGNOSIS — M9903 Segmental and somatic dysfunction of lumbar region: Secondary | ICD-10-CM | POA: Diagnosis not present

## 2023-02-21 DIAGNOSIS — U071 COVID-19: Secondary | ICD-10-CM | POA: Diagnosis not present

## 2023-02-21 DIAGNOSIS — E559 Vitamin D deficiency, unspecified: Secondary | ICD-10-CM | POA: Diagnosis not present

## 2023-02-21 DIAGNOSIS — R891 Abnormal level of hormones in specimens from other organs, systems and tissues: Secondary | ICD-10-CM | POA: Diagnosis not present

## 2023-02-21 DIAGNOSIS — B488 Other specified mycoses: Secondary | ICD-10-CM | POA: Diagnosis not present

## 2023-02-21 DIAGNOSIS — Z125 Encounter for screening for malignant neoplasm of prostate: Secondary | ICD-10-CM | POA: Diagnosis not present

## 2023-02-21 DIAGNOSIS — M9902 Segmental and somatic dysfunction of thoracic region: Secondary | ICD-10-CM | POA: Diagnosis not present

## 2023-02-21 DIAGNOSIS — E291 Testicular hypofunction: Secondary | ICD-10-CM | POA: Diagnosis not present

## 2023-02-28 ENCOUNTER — Encounter: Payer: Self-pay | Admitting: Nurse Practitioner

## 2023-02-28 ENCOUNTER — Ambulatory Visit: Payer: BC Managed Care – PPO | Attending: Nurse Practitioner | Admitting: Nurse Practitioner

## 2023-02-28 VITALS — BP 144/86 | HR 92 | Ht 72.0 in | Wt 219.0 lb

## 2023-02-28 DIAGNOSIS — I10 Essential (primary) hypertension: Secondary | ICD-10-CM | POA: Diagnosis not present

## 2023-02-28 DIAGNOSIS — R0609 Other forms of dyspnea: Secondary | ICD-10-CM

## 2023-02-28 DIAGNOSIS — Z720 Tobacco use: Secondary | ICD-10-CM | POA: Diagnosis not present

## 2023-02-28 DIAGNOSIS — E785 Hyperlipidemia, unspecified: Secondary | ICD-10-CM | POA: Diagnosis not present

## 2023-02-28 NOTE — Progress Notes (Signed)
Cardiology Office Note:  .   Date:  02/28/2023  ID:  Tyler Dean, DOB 1985/08/31, MRN [id number] PCP: Lovey Newcomer, PA  [facility] Providers Cardiologist:  Nona Dell, MD    History of Present Illness: Tyler Dean is a 37 y.o. male with a PMH of hypertension, hyperlipidemia, borderline dilated aortic root, migraines, and ADHD, who presents today for 1 year follow-up.  Last seen by Dr. Diona Browner on Aug 29, 2021.  He was overall doing well from a cardiac perspective.  Admitted 12/2021 for rhabdomyolysis secondary to excessive exercise/working out. Improved with IV fluids.   ED visit 06/2022 for atypical CP. 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  11/28/2022 - Today he presents for follow-up.  He states he is doing well.  He has been having myalgias that he states his medical team says is not related to Repatha.  He has been having thorough workup done to see if he has an autoimmune condition.  Blood pressure is elevated in office today as he has not taken his blood pressure medication this AM.  Admits to consistent shortness of breath, feels as though he is not able to take a full complete breath, attributes this to his smoking habit.  Smokes half pack per day, he is looking to quit. Denies any chest pain, palpitations, syncope, presyncope, dizziness, orthopnea, PND, swelling or significant weight changes, acute bleeding, or claudication.   ED visit on 01/01/2023 for flank pain, workup was reassuring, felt symptoms d/t muscle strain d/t exercising. UC visit on 02/17/23 for myalgia after exercising and workup was reassuring.   02/28/2023 - Today he presents for follow-up. No change in symptoms since I last saw him. Does admit to recent stress and denies any red flag signs or symptoms. Denies any chest pain, palpitations, syncope, presyncope,  dizziness, orthopnea, PND, swelling or significant weight changes, acute bleeding, or claudication. Continues to admit to stable shortness of breath since last office visit.   Studies Reviewed: .    Echo 11/2022:   1. Left ventricular ejection fraction, by estimation, is 55 to 60%. The  left ventricle has normal function. The left ventricle has no regional  wall motion abnormalities. Left ventricular diastolic parameters were  normal.   2. Right ventricular systolic function is normal. The right ventricular  size is normal. Tricuspid regurgitation signal is inadequate for assessing  PA pressure.   3. The mitral valve is normal in structure. No evidence of mitral valve  regurgitation. No evidence of mitral stenosis.   4. The aortic valve is tricuspid. Aortic valve regurgitation is not  visualized. No aortic stenosis is present.   5. The inferior vena cava is normal in size with greater than 50%  respiratory variability, suggesting right atrial pressure of 3 mmHg.   Comparison(s): EF 55-60% Mildy dilated aortic root (39mm).  Lexiscan 01/2021:   The study is normal. The study is low risk.   No ST deviation was noted. The ECG was negative for ischemia.   LV perfusion is normal.   Left ventricular function is normal. Nuclear stress EF: 56 %.   Low risk study with no significant myocardial perfusion defects to indicate ischemia and normal LVEF of 56%.   Echocardiogram 12/2020: 1.  Left ventricular EF 55 to 60%.  Left ventricle is normal function.  Left ventricle has no regional wall motion abnormalities.  Left ventricular diastolic  parameters are normal. 2.  Right ventricular systolic function is normal.  Right ventricular size is normal.  Tricuspid regurgitation signal is inadequate for assessing PA pressure. 3.  Mitral valve is grossly normal.  Trivial mitral valve regurgitation. 4.  Aortic valve is tricuspid.  Aortic valve regurgitation is not visualized. 5.  Aortic dilatation noted.   There is borderline dilatation of aortic root, measuring 39 mm. 6.  The inferior vena cava is normal in size with greater than 50% respiratory variability, suggesting right atrial pressure of 3 mmHg.  Physical Exam:   VS:  BP (!) 144/86 Comment: just smoked cigarette  Pulse 92 Comment: just smoked cigarette  Ht 6' (1.829 m)   Wt 219 lb (99.3 kg)   SpO2 97%   BMI 29.70 kg/m    Wt Readings from Last 3 Encounters:  02/28/23 219 lb (99.3 kg)  01/01/23 215 lb (97.5 kg)  11/28/22 218 lb 12.8 oz (99.2 kg)    GEN: Well nourished, well developed in no acute distress NECK: No JVD; No carotid bruits CARDIAC: S1/S2, RRR, no murmurs, rubs, gallops RESPIRATORY:  Clear to auscultation without rales, wheezing or rhonchi  ABDOMEN: Soft, non-tender, non-distended EXTREMITIES:  No edema; No deformity   ASSESSMENT AND PLAN: .    DOE Etiology unclear, however he has attributed this to his smoking habit.  Recent Echo benign. Lexiscan in 2022 was low risk and normal.  Smoking cessation encouraged and discussed-see #5.  Patient prefers observation at this point.  ED precautions discussed. He verbalized understanding.  HTN Blood pressure elevated today-states he has been drinking caffeine/smoked a cigarette prior to office visit. Home BP readings are well-controlled per his report. Discussed to monitor BP at home at least 2 hours after medications and sitting for 5-10 minutes.  Continue losartan.  He will contact our office if SBP remains > 130. Heart healthy diet and regular cardiovascular exercise encouraged.    HLD He has previously politely deferred blood work to his employer.  Continue Repatha.   Heart healthy diet and regular cardiovascular exercise encouraged.   Tobacco abuse Smoking cessation encouraged and discussed.  Dispo: Follow-up with me/APP in 6 months or sooner if anything changes.  Signed, Sharlene Dory, NP

## 2023-02-28 NOTE — Patient Instructions (Signed)

## 2023-03-06 DIAGNOSIS — H0279 Other degenerative disorders of eyelid and periocular area: Secondary | ICD-10-CM | POA: Diagnosis not present

## 2023-03-06 DIAGNOSIS — S0003XS Contusion of scalp, sequela: Secondary | ICD-10-CM | POA: Diagnosis not present

## 2023-03-06 DIAGNOSIS — D1730 Benign lipomatous neoplasm of skin and subcutaneous tissue of unspecified sites: Secondary | ICD-10-CM | POA: Diagnosis not present

## 2023-03-10 DIAGNOSIS — M9902 Segmental and somatic dysfunction of thoracic region: Secondary | ICD-10-CM | POA: Diagnosis not present

## 2023-03-10 DIAGNOSIS — M9903 Segmental and somatic dysfunction of lumbar region: Secondary | ICD-10-CM | POA: Diagnosis not present

## 2023-03-10 DIAGNOSIS — M62830 Muscle spasm of back: Secondary | ICD-10-CM | POA: Diagnosis not present

## 2023-03-10 DIAGNOSIS — M9905 Segmental and somatic dysfunction of pelvic region: Secondary | ICD-10-CM | POA: Diagnosis not present

## 2023-03-13 DIAGNOSIS — M791 Myalgia, unspecified site: Secondary | ICD-10-CM | POA: Diagnosis not present

## 2023-03-13 DIAGNOSIS — R4189 Other symptoms and signs involving cognitive functions and awareness: Secondary | ICD-10-CM | POA: Diagnosis not present

## 2023-03-13 DIAGNOSIS — M25522 Pain in left elbow: Secondary | ICD-10-CM | POA: Diagnosis not present

## 2023-03-13 DIAGNOSIS — I10 Essential (primary) hypertension: Secondary | ICD-10-CM | POA: Diagnosis not present

## 2023-03-21 DIAGNOSIS — D170 Benign lipomatous neoplasm of skin and subcutaneous tissue of head, face and neck: Secondary | ICD-10-CM | POA: Diagnosis not present

## 2023-03-25 DIAGNOSIS — H11442 Conjunctival cysts, left eye: Secondary | ICD-10-CM | POA: Diagnosis not present

## 2023-03-25 DIAGNOSIS — M79642 Pain in left hand: Secondary | ICD-10-CM | POA: Diagnosis not present

## 2023-03-25 DIAGNOSIS — M791 Myalgia, unspecified site: Secondary | ICD-10-CM | POA: Diagnosis not present

## 2023-03-25 DIAGNOSIS — M1991 Primary osteoarthritis, unspecified site: Secondary | ICD-10-CM | POA: Diagnosis not present

## 2023-03-25 DIAGNOSIS — M25561 Pain in right knee: Secondary | ICD-10-CM | POA: Diagnosis not present

## 2023-03-25 DIAGNOSIS — M79641 Pain in right hand: Secondary | ICD-10-CM | POA: Diagnosis not present

## 2023-03-25 DIAGNOSIS — M25562 Pain in left knee: Secondary | ICD-10-CM | POA: Diagnosis not present

## 2023-03-25 DIAGNOSIS — R5383 Other fatigue: Secondary | ICD-10-CM | POA: Diagnosis not present

## 2023-04-07 ENCOUNTER — Telehealth: Payer: Self-pay | Admitting: Cardiology

## 2023-04-07 ENCOUNTER — Other Ambulatory Visit: Payer: Self-pay | Admitting: *Deleted

## 2023-04-07 DIAGNOSIS — R0609 Other forms of dyspnea: Secondary | ICD-10-CM

## 2023-04-07 DIAGNOSIS — R0789 Other chest pain: Secondary | ICD-10-CM

## 2023-04-07 NOTE — Telephone Encounter (Signed)
Checking percert on the following patient for testing scheduled at [facility].    LEXISCAN   04/14/2023

## 2023-04-09 ENCOUNTER — Encounter: Payer: Self-pay | Admitting: *Deleted

## 2023-04-09 DIAGNOSIS — H11442 Conjunctival cysts, left eye: Secondary | ICD-10-CM | POA: Diagnosis not present

## 2023-04-09 DIAGNOSIS — I10 Essential (primary) hypertension: Secondary | ICD-10-CM | POA: Diagnosis not present

## 2023-04-09 DIAGNOSIS — F17219 Nicotine dependence, cigarettes, with unspecified nicotine-induced disorders: Secondary | ICD-10-CM | POA: Diagnosis not present

## 2023-04-09 DIAGNOSIS — Z01818 Encounter for other preprocedural examination: Secondary | ICD-10-CM | POA: Diagnosis not present

## 2023-04-10 DIAGNOSIS — D170 Benign lipomatous neoplasm of skin and subcutaneous tissue of head, face and neck: Secondary | ICD-10-CM | POA: Diagnosis not present

## 2023-04-14 ENCOUNTER — Ambulatory Visit (HOSPITAL_COMMUNITY)
Admission: RE | Admit: 2023-04-14 | Discharge: 2023-04-14 | Disposition: A | Discharge status: Home / Self Care | Payer: BC Managed Care – PPO | Source: Ambulatory Visit | Attending: Cardiology | Admitting: Cardiology

## 2023-04-14 ENCOUNTER — Ambulatory Visit (HOSPITAL_BASED_OUTPATIENT_CLINIC_OR_DEPARTMENT_OTHER)
Admission: RE | Admit: 2023-04-14 | Discharge: 2023-04-14 | Disposition: A | Discharge status: Home / Self Care | Payer: BC Managed Care – PPO | Source: Ambulatory Visit | Attending: Cardiology | Admitting: Cardiology

## 2023-04-14 DIAGNOSIS — R0789 Other chest pain: Secondary | ICD-10-CM | POA: Insufficient documentation

## 2023-04-14 DIAGNOSIS — B488 Other specified mycoses: Secondary | ICD-10-CM | POA: Diagnosis not present

## 2023-04-14 DIAGNOSIS — A6920 Lyme disease, unspecified: Secondary | ICD-10-CM | POA: Diagnosis not present

## 2023-04-14 DIAGNOSIS — R0609 Other forms of dyspnea: Secondary | ICD-10-CM | POA: Insufficient documentation

## 2023-04-14 DIAGNOSIS — B2790 Infectious mononucleosis, unspecified without complication: Secondary | ICD-10-CM | POA: Diagnosis not present

## 2023-04-14 DIAGNOSIS — E291 Testicular hypofunction: Secondary | ICD-10-CM | POA: Diagnosis not present

## 2023-04-14 LAB — NM MYOCAR MULTI W/SPECT W/WALL MOTION / EF
LV dias vol: 110 mL (ref 62–150)
LV sys vol: 39 mL
Nuc Stress EF: 65 %
Peak HR: 109 {beats}/min
RATE: 0.5
Rest HR: 80 {beats}/min
Rest Nuclear Isotope Dose: 10.7 mCi
SDS: 0
SRS: 0
SSS: 0
ST Depression (mm): 0 mm
Stress Nuclear Isotope Dose: 31.6 mCi
TID: 1.12

## 2023-04-14 MED ORDER — TECHNETIUM TC 99M TETROFOSMIN IV KIT
10.7000 | PACK | Freq: Once | INTRAVENOUS | Status: AC | PRN
  Administered 2023-04-14: 10.7 via INTRAVENOUS

## 2023-04-14 MED ORDER — SODIUM CHLORIDE FLUSH 0.9 % IV SOLN
INTRAVENOUS | Status: AC
  Administered 2023-04-14: 10 mL via INTRAVENOUS
  Filled 2023-04-14: qty 10

## 2023-04-14 MED ORDER — REGADENOSON 0.4 MG/5ML IV SOLN
INTRAVENOUS | Status: AC
  Administered 2023-04-14: 0.4 mg via INTRAVENOUS
  Filled 2023-04-14: qty 5

## 2023-04-14 MED ORDER — TECHNETIUM TC 99M TETROFOSMIN IV KIT
31.6000 | PACK | Freq: Once | INTRAVENOUS | Status: AC | PRN
  Administered 2023-04-14: 31.6 via INTRAVENOUS

## 2023-04-16 DIAGNOSIS — M79642 Pain in left hand: Secondary | ICD-10-CM | POA: Diagnosis not present

## 2023-04-16 DIAGNOSIS — M79641 Pain in right hand: Secondary | ICD-10-CM | POA: Diagnosis not present

## 2023-04-16 DIAGNOSIS — R5383 Other fatigue: Secondary | ICD-10-CM | POA: Diagnosis not present

## 2023-04-16 DIAGNOSIS — M797 Fibromyalgia: Secondary | ICD-10-CM | POA: Diagnosis not present

## 2023-06-14 ENCOUNTER — Ambulatory Visit
Admission: EM | Admit: 2023-06-14 | Discharge: 2023-06-14 | Disposition: A | Discharge status: Home / Self Care | Payer: BC Managed Care – PPO | Attending: Nurse Practitioner | Admitting: Nurse Practitioner

## 2023-06-14 ENCOUNTER — Ambulatory Visit (INDEPENDENT_AMBULATORY_CARE_PROVIDER_SITE_OTHER): Payer: BC Managed Care – PPO

## 2023-06-14 DIAGNOSIS — S6992XA Unspecified injury of left wrist, hand and finger(s), initial encounter: Secondary | ICD-10-CM

## 2023-06-14 DIAGNOSIS — M79645 Pain in left finger(s): Secondary | ICD-10-CM | POA: Diagnosis not present

## 2023-06-14 DIAGNOSIS — M79642 Pain in left hand: Secondary | ICD-10-CM

## 2023-06-14 NOTE — Discharge Instructions (Addendum)
X-ray of your hand is negative for fracture or dislocation. Take over-the-counter analgesics such as ibuprofen or Tylenol as needed for pain or discomfort. Wear the Ace wrap when you are engaged in prolonged or strenuous activity. RICE therapy, rest, ice, compression, and elevation.  Apply ice for 20 minutes, remove for 1 hour, repeat as needed. Symptoms should begin to improve over the next 1 to 2 weeks.  If symptoms fail to improve, recommend following up with orthopedics for further evaluation. Follow-up as needed.

## 2023-06-14 NOTE — ED Triage Notes (Signed)
Pt reports he was moving and hit his left hand on a piece of wood furniture x 3 weeks  Pain towards his ring finger and pinky finger.

## 2023-06-14 NOTE — ED Provider Notes (Signed)
[facility]    CSN: [id number] Arrival date & time: 06/14/23  0836      History   Chief Complaint Chief Complaint  Patient presents with   Hand Pain    HPI Tyler Dean is a 38 y.o. male.   The history is provided by the patient.   Patient presents for complaints of left hand pain is been present for the past 3 weeks.  Patient states he was helping someone move furniture, when he hit the backside of his left hand along the pinky and ring fingers.  He states he now has pain in the bones under those fingers and tenderness along the backside of his hand.  He states that he did have swelling initially along with bruising, but that has since improved.  Continues to experience pain with making a fist, moving his fingers, and when he is lifting weights.  Patient states that he is right-hand dominant.  States that he used ice initially for symptoms.  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   Generalized anxiety disorder 02/27/2016   Contact dermatitis and other eczema 06/13/2015    Past Surgical History:  Procedure Laterality Date   CYST EXCISION     face 11/2022   CYSTOSCOPY N/A 04/27/2013   Procedure: CYSTOSCOPY;  Surgeon: Ky Barban, MD;  Location: [facility];  Service: Urology;  Laterality: N/A;   DENTAL SURGERY  05/19/2019   EAR CYST EXCISION N/A 04/27/2013   Procedure: EXCISION OF  SEBACEOUS CYST ON PENIS AND SCROTUM;  Surgeon: Ky Barban, MD;  Location: [facility];  Service: Urology;  Laterality: N/A;   PILONIDAL CYST EXCISION N/A 04/16/2013   Procedure: CYST EXCISION PILONIDAL SIMPLE;  Surgeon: Dalia Heading, MD;  Location: [facility];  Service: General;  Laterality: N/A;       Home Medications    Prior to Admission medications   Medication Sig Start Date End Date Taking? Authorizing Provider  Evolocumab (REPATHA SURECLICK) 140 MG/ML SOAJ Inject 140 mg into the skin every 14 (fourteen) days. 12/05/22   Sharlene Dory, NP  ibuprofen (ADVIL) 600 MG tablet Take 1 tablet (600 mg total) by mouth every 8 (eight) hours as needed for moderate pain. 01/01/23   Burgess Amor, PA-C  losartan (COZAAR) 100 MG tablet Take 1 tablet (100 mg total) by mouth daily. 11/28/22   Sharlene Dory, NP  rizatriptan (MAXALT-MLT) 10 MG disintegrating tablet Take 1 tablet (10 mg total) by mouth as needed for migraine. May repeat in 2 hours if needed 11/11/22   Anson Fret, MD  Vitamin D, Ergocalciferol, (DRISDOL) 1.25 MG (50000 UNIT) CAPS capsule Take 50,000 Units by mouth every 7 (seven) days.    [provider]    Family History Family History  Problem Relation Age of Onset   Heart disease Mother    Migraines Mother    Cancer Father    Migraines Brother  Heart disease Maternal Grandmother    Migraines Maternal Grandmother    Heart disease Maternal Grandfather    Migraines Maternal Uncle    Migraines Maternal Uncle    Colon cancer Neg Hx        grandmother with colon cancer   Colon polyps Neg Hx     Social History Social History   Tobacco Use   Smoking status: Every Day    Current packs/day: 0.50    Average packs/day: 0.5 packs/day for 10.0 years (5.0 ttl pk-yrs)    Types: Cigarettes   Smokeless tobacco: Never  Vaping Use   Vaping status: Some Days  Substance Use Topics   Alcohol use: Never   Drug use: Never     Allergies   Atorvastatin, Pravastatin, and  Rosuvastatin   Review of Systems Review of Systems Per HPI  Physical Exam Triage Vital Signs ED Triage Vitals  Encounter Vitals Group     BP 06/14/23 0912 138/81     Systolic BP Percentile --      Diastolic BP Percentile --      Pulse Rate 06/14/23 0912 90     Resp 06/14/23 0912 20     Temp 06/14/23 0912 97.9 F (36.6 C)     Temp Source 06/14/23 0912 Oral     SpO2 06/14/23 0912 98 %     Weight --      Height --      Head Circumference --      Peak Flow --      Pain Score 06/14/23 0915 5     Pain Loc --      Pain Education --      Exclude from Growth Chart --    No data found.  Updated Vital Signs BP 138/81 (BP Location: Right Arm)   Pulse 90   Temp 97.9 F (36.6 C) (Oral)   Resp 20   SpO2 98%   Visual Acuity Right Eye Distance:   Left Eye Distance:   Bilateral Distance:    Right Eye Near:   Left Eye Near:    Bilateral Near:     Physical Exam Vitals and nursing note reviewed.  Constitutional:      General: He is not in acute distress.    Appearance: Normal appearance.  HENT:     Head: Normocephalic.  Eyes:     Extraocular Movements: Extraocular movements intact.     Pupils: Pupils are equal, round, and reactive to light.  Pulmonary:     Effort: Pulmonary effort is normal.  Musculoskeletal:     Left hand: Tenderness present. No swelling or deformity. Normal range of motion. Normal strength. Normal sensation. Normal capillary refill. Normal pulse.     Cervical back: Normal range of motion.     Comments: Mild bruising with tenderness noted along the dorsal aspect of the left hand at the fourth and fifth metacarpals.  Neurovascular status is intact.  Skin:    General: Skin is warm and dry.  Neurological:     General: No focal deficit present.     Mental Status: He is alert and oriented to person, place, and time.  Psychiatric:        Mood and Affect: Mood normal.        Behavior: Behavior normal.      UC Treatments / Results  Labs (all labs  ordered are listed, but only abnormal results are displayed) Labs Reviewed - No data to display  EKG   Radiology DG  Hand Complete Left Result Date: 06/14/2023 CLINICAL DATA:  Hand trauma on would furniture about 3 weeks prior to imaging, pain along the small finger and ring finger. EXAM: LEFT HAND - COMPLETE 3+ VIEW COMPARISON:  None Available. FINDINGS: Mild degenerative spurring of the first carpometacarpal articulation. No fracture or malalignment identified. IMPRESSION: 1. Mild degenerative spurring of the first carpometacarpal articulation. No fracture or malalignment identified. Electronically Signed   By: Gaylyn Rong M.D.   On: 06/14/2023 09:59    Procedures Procedures (including critical care time)  Medications Ordered in UC Medications - No data to display  Initial Impression / Assessment and Plan / UC Course  I have reviewed the triage vital signs and the nursing notes.  Pertinent labs & imaging results that were available during my care of the patient were reviewed by me and considered in my medical decision making (see chart for details).  X-ray of the left hand is negative for fracture or dislocation.  Patient does have mild degenerative spurring of the first carpometacarpal articulation.  Symptoms consistent with possible contusion of the left hand.  Ace wrap was provided to allow for compression and support.  Supportive care recommendations were provided and discussed with the patient to include RICE therapy, and over-the-counter analgesics.  Patient was advised symptoms should improve over the next 1 to 2 weeks, he was given information for orthopedics if symptoms fail to improve.  Patient was in agreement with this plan of care and verbalizes understanding.  All questions were answered.   Final Clinical Impressions(s) / UC Diagnoses   Final diagnoses:  Left hand pain  Hand injury, left, initial encounter     Discharge Instructions      X-ray of your hand is  negative for fracture or dislocation. Take over-the-counter analgesics such as ibuprofen or Tylenol as needed for pain or discomfort. Wear the Ace wrap when you are engaged in prolonged or strenuous activity. RICE therapy, rest, ice, compression, and elevation.  Apply ice for 20 minutes, remove for 1 hour, repeat as needed. Symptoms should begin to improve over the next 1 to 2 weeks.  If symptoms fail to improve, recommend following up with orthopedics for further evaluation. Follow-up as needed.     ED Prescriptions   None    PDMP not reviewed this encounter.   Abran Cantor, NP 06/14/23 1005

## 2023-08-07 DIAGNOSIS — B349 Viral infection, unspecified: Secondary | ICD-10-CM | POA: Diagnosis not present

## 2023-08-07 DIAGNOSIS — Z6830 Body mass index (BMI) 30.0-30.9, adult: Secondary | ICD-10-CM | POA: Diagnosis not present

## 2023-08-07 DIAGNOSIS — R112 Nausea with vomiting, unspecified: Secondary | ICD-10-CM | POA: Diagnosis not present

## 2023-08-07 DIAGNOSIS — Z20828 Contact with and (suspected) exposure to other viral communicable diseases: Secondary | ICD-10-CM | POA: Diagnosis not present

## 2023-08-07 DIAGNOSIS — R110 Nausea: Secondary | ICD-10-CM | POA: Diagnosis not present

## 2023-08-07 DIAGNOSIS — R509 Fever, unspecified: Secondary | ICD-10-CM | POA: Diagnosis not present

## 2023-08-28 ENCOUNTER — Ambulatory Visit: Payer: BC Managed Care – PPO | Admitting: Nurse Practitioner

## 2023-11-06 ENCOUNTER — Encounter: Payer: Self-pay | Admitting: Nurse Practitioner

## 2023-11-06 ENCOUNTER — Ambulatory Visit: Attending: Nurse Practitioner | Admitting: Nurse Practitioner

## 2023-11-06 VITALS — BP 124/80 | HR 95 | Ht 72.0 in | Wt 234.0 lb

## 2023-11-06 DIAGNOSIS — R079 Chest pain, unspecified: Secondary | ICD-10-CM

## 2023-11-06 DIAGNOSIS — E785 Hyperlipidemia, unspecified: Secondary | ICD-10-CM | POA: Diagnosis not present

## 2023-11-06 DIAGNOSIS — I10 Essential (primary) hypertension: Secondary | ICD-10-CM

## 2023-11-06 DIAGNOSIS — Z720 Tobacco use: Secondary | ICD-10-CM | POA: Diagnosis not present

## 2023-11-06 NOTE — Progress Notes (Signed)
 Cardiology Office Note:  .   Date:  11/06/2023 ID:  JOSPEH MANGEL, DOB 1986-01-18, MRN [id number] PCP: Jolee Elsie RAMAN, PA  [facility] Providers Cardiologist:  Jayson Sierras, MD    History of Present Illness: CONSTANTINE RUDDICK is a 38 y.o. male with a PMH of hypertension, hyperlipidemia, borderline dilated aortic root, migraines, and ADHD, who presents today for 6 month follow-up.  Last seen by Dr. Sierras on Aug 29, 2021.  He was overall doing well from a cardiac perspective.  Admitted 12/2021 for rhabdomyolysis secondary to excessive exercise/working out. Improved with IV fluids.   ED visit 06/2022 for atypical CP. 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  11/28/2022 - Today he presents for follow-up.  He states he is doing well.  He has been having myalgias that he states his medical team says is not related to Repatha .  He has been having thorough workup done to see if he has an autoimmune condition.  Blood pressure is elevated in office today as he has not taken his blood pressure medication this AM.  Admits to consistent shortness of breath, feels as though he is not able to take a full complete breath, attributes this to his smoking habit.  Smokes half pack per day, he is looking to quit. Denies any chest pain, palpitations, syncope, presyncope, dizziness, orthopnea, PND, swelling or significant weight changes, acute bleeding, or claudication.   ED visit on 01/01/2023 for flank pain, workup was reassuring, felt symptoms d/t muscle strain d/t exercising. UC visit on 02/17/23 for myalgia after exercising and workup was reassuring.   02/28/2023 - Today he presents for follow-up. No change in symptoms since I last saw him. Does admit to recent stress and denies any red flag signs or symptoms. Denies any chest pain, palpitations, syncope, presyncope,  dizziness, orthopnea, PND, swelling or significant weight changes, acute bleeding, or claudication. Continues to admit to stable shortness of breath since last office visit.   11/06/2023 -  Here for 6 month follow-up. Admits to continued episodes of random chest pain, stable over time. Occurs along left side of chest. Denies any specific triggers, says it occurs over 1 hour or more. Denies any shortness of breath, palpitations, syncope, presyncope, dizziness, orthopnea, PND, swelling or significant weight changes, acute bleeding, or claudication. Says he has returned to taking Repatha , stopped it momentarily not long ago to see if this would improve his brain fog.   SH: Does seldom drink alcohol, does admit to smoking 1 PPD, denies any ilicit drug use.   FH: Does report a strong family history of cardiovascular disease. Paternal uncle died at age 68 from heart disease.   Studies Reviewed: SABRA    EKG: EKG Interpretation Date/Time:  Thursday November 06 2023 15:37:40 EDT Ventricular Rate:  96 PR Interval:  128 QRS Duration:  74 QT Interval:  346 QTC Calculation: 437 R Axis:   53  Text Interpretation: Normal sinus rhythm Normal ECG When compared with ECG of 02-Jul-2022 10:08, PREVIOUS ECG IS PRESENT Confirmed by Miriam Norris [phone]) on 11/06/2023 3:45:19 PM   NST 03/2023:    The study is normal. There are no perfusion defects The study is low risk.   No ST deviation was noted.   LV perfusion is normal.   Left ventricular function is normal. Nuclear stress EF: 65%. The left ventricular ejection fraction is normal (55-65%). End diastolic  cavity size is normal.  Echo 11/2022:   1. Left ventricular ejection fraction, by estimation, is 55 to 60%. The  left ventricle has normal function. The left ventricle has no regional  wall motion abnormalities. Left ventricular diastolic parameters were  normal.   2. Right ventricular systolic function is normal. The right ventricular  size is normal.  Tricuspid regurgitation signal is inadequate for assessing  PA pressure.   3. The mitral valve is normal in structure. No evidence of mitral valve  regurgitation. No evidence of mitral stenosis.   4. The aortic valve is tricuspid. Aortic valve regurgitation is not  visualized. No aortic stenosis is present.   5. The inferior vena cava is normal in size with greater than 50%  respiratory variability, suggesting right atrial pressure of 3 mmHg.   Comparison(s): EF 55-60% Mildy dilated aortic root (39mm).  Lexiscan  01/2021:   The study is normal. The study is low risk.   No ST deviation was noted. The ECG was negative for ischemia.   LV perfusion is normal.   Left ventricular function is normal. Nuclear stress EF: 56 %.   Low risk study with no significant myocardial perfusion defects to indicate ischemia and normal LVEF of 56%.   Echocardiogram 12/2020: 1.  Left ventricular EF 55 to 60%.  Left ventricle is normal function.  Left ventricle has no regional wall motion abnormalities.  Left ventricular diastolic parameters are normal. 2.  Right ventricular systolic function is normal.  Right ventricular size is normal.  Tricuspid regurgitation signal is inadequate for assessing PA pressure. 3.  Mitral valve is grossly normal.  Trivial mitral valve regurgitation. 4.  Aortic valve is tricuspid.  Aortic valve regurgitation is not visualized. 5.  Aortic dilatation noted.  There is borderline dilatation of aortic root, measuring 39 mm. 6.  The inferior vena cava is normal in size with greater than 50% respiratory variability, suggesting right atrial pressure of 3 mmHg.  Physical Exam:   VS:  BP 124/80   Pulse 95   Ht 6' (1.829 m)   Wt 234 lb (106.1 kg)   SpO2 99%   BMI 31.74 kg/m    Wt Readings from Last 3 Encounters:  11/06/23 234 lb (106.1 kg)  02/28/23 219 lb (99.3 kg)  01/01/23 215 lb (97.5 kg)    GEN: Well nourished, well developed in no acute distress NECK: No JVD; No carotid  bruits CARDIAC: S1/S2, RRR, no murmurs, rubs, gallops RESPIRATORY:  Clear to auscultation without rales, wheezing or rhonchi  ABDOMEN: Soft, non-tender, non-distended EXTREMITIES:  No edema; No deformity   ASSESSMENT AND PLAN: .    Chest pain of uncertain etiology Etiology unclear. Previous NST 03/2023 was benign. Did discuss tx options, pt declines at this time. Will continue to monitor. No medication changes at this time. Heart healthy diet and regular cardiovascular exercise encouraged. Care and ED precautions discussed.   HTN Blood pressure stable today. Discussed to monitor BP at home at least 2 hours after medications and sitting for 5-10 minutes.  Continue losartan .  He will contact our office if SBP remains > 130. Heart healthy diet and regular cardiovascular exercise encouraged.    HLD He has previously politely deferred blood work to his employer - will plan to check this in 3 months and have these results faxed to our office - he verbalized understanding.  Continue Repatha .   Heart healthy diet and regular cardiovascular exercise encouraged.   Tobacco abuse Smoking cessation encouraged and discussed.  Dispo: Follow-up with me/APP in 6 months or sooner if anything changes.  Signed, Almarie Crate, NP

## 2023-11-06 NOTE — Patient Instructions (Addendum)

## 2023-11-17 ENCOUNTER — Ambulatory Visit
Admission: EM | Admit: 2023-11-17 | Discharge: 2023-11-17 | Disposition: A | Discharge status: Home / Self Care | Attending: Family Medicine | Admitting: Family Medicine

## 2023-11-17 ENCOUNTER — Ambulatory Visit (INDEPENDENT_AMBULATORY_CARE_PROVIDER_SITE_OTHER)

## 2023-11-17 DIAGNOSIS — M79641 Pain in right hand: Secondary | ICD-10-CM

## 2023-11-17 DIAGNOSIS — Z043 Encounter for examination and observation following other accident: Secondary | ICD-10-CM | POA: Diagnosis not present

## 2023-11-17 NOTE — ED Provider Notes (Signed)
 [facility]    CSN: [id number] Arrival date & time: 11/17/23  9062      History   Chief Complaint No chief complaint on file.   HPI Tyler Dean is a 38 y.o. male.   Patient presenting today with right hand pain, swelling after catching himself while falling 2 days ago.  Denies loss of range of motion, significant numbness tingling or weakness, skin injury.  Has been trying ibuprofen  with minimal relief.    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   Generalized anxiety disorder 02/27/2016   Contact dermatitis and other eczema 06/13/2015    Past Surgical History:  Procedure Laterality Date   CYST EXCISION     face 11/2022   CYSTOSCOPY N/A 04/27/2013   Procedure: CYSTOSCOPY;  Surgeon: Emery LILLETTE Blaze, MD;  Location: [facility];  Service: Urology;  Laterality: N/A;   DENTAL SURGERY  05/19/2019   EAR CYST EXCISION N/A 04/27/2013   Procedure: EXCISION OF SEBACEOUS CYST ON PENIS AND SCROTUM;  Surgeon: Mohammad I Javaid, MD;  Location: [facility];  Service: Urology;  Laterality: N/A;   PILONIDAL CYST EXCISION N/A 04/16/2013   Procedure: CYST EXCISION PILONIDAL SIMPLE;  Surgeon: Oneil DELENA Budge, MD;  Location: [facility];  Service: General;  Laterality: N/A;       Home Medications    Prior to Admission medications   Medication Sig Start Date End Date Taking? Authorizing Provider  Evolocumab  (REPATHA   SURECLICK) 140 MG/ML SOAJ Inject 140 mg into the skin every 14 (fourteen) days. 12/05/22   Miriam Norris, NP  ibuprofen  (ADVIL ) 600 MG tablet Take 1 tablet (600 mg total) by mouth every 8 (eight) hours as needed for moderate pain. 01/01/23   Idol, Julie, PA-C  losartan  (COZAAR ) 100 MG tablet Take 1 tablet (100 mg total) by mouth daily. 11/28/22   Miriam Norris, NP  rizatriptan  (MAXALT -MLT) 10 MG disintegrating tablet Take 1 tablet (10 mg total) by mouth as needed for migraine. May repeat in 2 hours if needed 11/11/22   Ines Onetha NOVAK, MD    Family History Family History  Problem Relation Age of Onset   Heart disease Mother    Migraines Mother    Cancer Father    Migraines Brother    Heart disease Maternal Grandmother    Migraines Maternal Grandmother    Heart disease Maternal Grandfather    Migraines Maternal Uncle    Migraines Maternal Uncle    Colon cancer Neg Hx        grandmother with colon cancer   Colon polyps Neg Hx     Social History Social History   Tobacco Use   Smoking status: Every Day    Current packs/day: 0.50    Average packs/day: 0.5 packs/day for 10.0 years (5.0 ttl pk-yrs)    Types: Cigarettes   Smokeless tobacco: Never  Vaping Use   Vaping status: Some Days  Substance Use Topics  Alcohol use: Never   Drug use: Never     Allergies   Atorvastatin, Pravastatin, and Rosuvastatin   Review of Systems Review of Systems Per HPI  Physical Exam Triage Vital Signs ED Triage Vitals  Encounter Vitals Group     BP 11/17/23 1002 (!) 153/92     Girls Systolic BP Percentile --      Girls Diastolic BP Percentile --      Boys Systolic BP Percentile --      Boys Diastolic BP Percentile --      Pulse Rate 11/17/23 1002 93     Resp 11/17/23 1002 18     Temp 11/17/23 1002 98.4 F (36.9 C)     Temp Source 11/17/23 1002 Oral     SpO2 11/17/23 1002 96 %     Weight --      Height --      Head Circumference --      Peak Flow --      Pain Score 11/17/23 1005 6      Pain Loc --      Pain Education --      Exclude from Growth Chart --    No data found.  Updated Vital Signs BP (!) 153/92 (BP Location: Right Arm)   Pulse 93   Temp 98.4 F (36.9 C) (Oral)   Resp 18   SpO2 96%   Visual Acuity Right Eye Distance:   Left Eye Distance:   Bilateral Distance:    Right Eye Near:   Left Eye Near:    Bilateral Near:     Physical Exam Vitals and nursing note reviewed.  Constitutional:      Appearance: Normal appearance.  HENT:     Head: Atraumatic.  Eyes:     Extraocular Movements: Extraocular movements intact.     Conjunctiva/sclera: Conjunctivae normal.  Cardiovascular:     Rate and Rhythm: Normal rate.  Pulmonary:     Effort: Pulmonary effort is normal.  Musculoskeletal:        General: Swelling, tenderness and signs of injury present. No deformity. Normal range of motion.     Cervical back: Normal range of motion and neck supple.     Comments: Localized edema, tenderness to palpation to the base of the right hand near the ulna.  Range of motion intact, grip strength full and equal bilateral hands  Skin:    General: Skin is warm and dry.  Neurological:     Mental Status: He is oriented to person, place, and time.     Motor: No weakness.     Gait: Gait normal.     Comments: Right hand neurovascularly intact  Psychiatric:        Mood and Affect: Mood normal.        Thought Content: Thought content normal.        Judgment: Judgment normal.      UC Treatments / Results  Labs (all labs ordered are listed, but only abnormal results are displayed) Labs Reviewed - No data to display  EKG   Radiology DG Hand Complete Right Result Date: 11/17/2023 CLINICAL DATA:  Fall onto outstretched hand. EXAM: RIGHT HAND - COMPLETE 3+ VIEW COMPARISON:  None Available. FINDINGS: The mineralization and alignment are normal. There is no evidence of acute fracture or dislocation. The joint spaces appear preserved. No foreign body or other focal  soft tissue abnormality identified. IMPRESSION: No evidence of acute fracture or dislocation. Electronically Signed   By: Elsie  Gertrude M.D.   On: 11/17/2023 10:20    Procedures Procedures (including critical care time)  Medications Ordered in UC Medications - No data to display  Initial Impression / Assessment and Plan / UC Course  I have reviewed the triage vital signs and the nursing notes.  Pertinent labs & imaging results that were available during my care of the patient were reviewed by me and considered in my medical decision making (see chart for details).     X-ray of the right hand negative for acute bony abnormality.  Discussed RICE protocol, over-the-counter pain relievers, supportive home care.  Return for worsening symptoms.  Final Clinical Impressions(s) / UC Diagnoses   Final diagnoses:  Right hand pain     Discharge Instructions      Your x-ray was negative for fracture.  We have applied an Ace wrap today and you may ice, elevate, and take ibuprofen  and Tylenol  as needed.    ED Prescriptions   None    PDMP not reviewed this encounter.   Stuart Vernell Norris, [place] 11/17/23 1117

## 2023-11-17 NOTE — Discharge Instructions (Signed)
 Your x-ray was negative for fracture.  We have applied an Ace wrap today and you may ice, elevate, and take ibuprofen  and Tylenol  as needed.

## 2023-11-17 NOTE — ED Triage Notes (Signed)
 Pt reports fall that caused injury to the hands pain is present in the right hand, around the pinky and the wrist.

## 2023-11-25 ENCOUNTER — Other Ambulatory Visit: Payer: Self-pay | Admitting: Neurology

## 2023-11-25 NOTE — Telephone Encounter (Signed)
 After checking DPR phone rep left a vm asking pt to call office re: the need of an appointment being scheduled for him

## 2023-11-26 NOTE — Telephone Encounter (Signed)
Left voicemail for patient to call the office to schedule an appointment

## 2023-11-29 ENCOUNTER — Other Ambulatory Visit: Payer: Self-pay | Admitting: Nurse Practitioner

## 2023-12-08 ENCOUNTER — Other Ambulatory Visit: Payer: Self-pay

## 2023-12-08 MED ORDER — LOSARTAN POTASSIUM 100 MG PO TABS: 100.0000 mg | ORAL_TABLET | Freq: Every day | ORAL | 3 refills | Status: AC

## 2023-12-08 NOTE — Telephone Encounter (Signed)
 Attempts have been made in reaching out to get pt to call and schedule needed appointment on 7/29 and 7/30

## 2023-12-17 NOTE — Telephone Encounter (Signed)
Unable to reach pt to schedule an appt.

## 2023-12-18 ENCOUNTER — Other Ambulatory Visit: Payer: Self-pay | Admitting: Neurology

## 2023-12-18 MED ORDER — RIZATRIPTAN BENZOATE 10 MG PO TBDP: 10.0000 mg | ORAL_TABLET | ORAL | 11 refills | Status: AC | PRN

## 2023-12-21 NOTE — Progress Notes (Unsigned)
 Tyler Dean, male    DOB: 12/24/1985    MRN: [id number]   Brief patient profile:  57  yowm active smoker  referred to pulmonary clinic in [place]  12/23/2023 by Tyler Dean  for doe onset around 2020 s/p covid x 2 last 2022 already eval by [facility] 04/2022 with spirometry neg for airflow obst   Pt not previously seen by PCCM service.     History of Present Illness  12/23/2023  Pulmonary/ 1st office eval/ Tyler Dean / [facility]  Chief Complaint  Patient presents with   Establish Care    Can not take deep breathes in - doe  Dyspnea:  normal pace ok / steps slow/ pace 2-3 flight  Goes to gym every other day does wts but says gets dizzy on treadmill With exertion has sense of chest / abd tightness varies with deep breath vs shallow and worse on insp over epigastrium no better with GERD rx  Cough: none  Sleep: flat bed / 2 pillows s noct symptoms  SABA use: not using  02: none    No obvious day to day or daytime pattern/variability or assoc excess/ purulent sputum or mucus plugs or hemoptysis or   subjective wheeze or overt sinus or hb symptoms.    Also denies any obvious fluctuation of symptoms with weather or environmental changes or other aggravating or alleviating factors except as outlined above   No unusual exposure hx or h/o childhood pna/ asthma or knowledge of premature birth.  Current Allergies, Complete Past Medical History, Past Surgical History, Family History, and Social History were reviewed in [employer] record.  ROS  The following are not active complaints unless bolded Hoarseness, sore throat, dysphagia, dental problems, itching, sneezing,  nasal congestion or discharge of excess mucus or purulent secretions, ear ache,   fever, chills, sweats, unintended wt loss or wt gain, classically pleuritic or exertional cp,  orthopnea pnd or arm/hand swelling  or leg swelling, presyncope, palpitations, abdominal pain, anorexia, nausea, vomiting,  diarrhea  or change in bowel habits or change in bladder habits, change in stools or change in urine, dysuria, hematuria,  rash, arthralgias, visual complaints, headache, numbness, weakness or ataxia or problems with walking or coordination,  change in mood or  memory.            Outpatient Medications Prior to Visit  Medication Sig Dispense Refill   Evolocumab  (REPATHA  SURECLICK) 140 MG/ML SOAJ Inject 140 mg into the skin every 14 (fourteen) days. 2 mL 6   ibuprofen  (ADVIL ) 600 MG tablet Take 1 tablet (600 mg total) by mouth every 8 (eight) hours as needed for moderate pain. 15 tablet 0   losartan  (COZAAR ) 100 MG tablet Take 1 tablet (100 mg total) by mouth daily. 90 tablet 3   rizatriptan  (MAXALT -MLT) 10 MG disintegrating tablet Take 1 tablet (10 mg total) by mouth as needed for migraine. May repeat in 2 hours if needed 9 tablet 11   No facility-administered medications prior to visit.    Past Medical History:  Diagnosis Date   ADHD    Essential hypertension    Hyperlipidemia    Migraines       Objective:     BP (!) 147/85   Pulse 85   Ht 6' (1.829 m)   Wt 239 lb 3.2 oz (108.5 kg)   SpO2 96% Comment: ra  BMI 32.44 kg/m   SpO2: 96 % (ra)  amb somber wm mod obese by BMI  HEENT : Oropharynx  clear       NECK :  without  apparent JVD/ palpable Nodes/TM    LUNGS: no acc muscle use,  Nl contour chest which is clear to A and P bilaterally without cough on insp or exp maneuvers   CV:  RRR  no s3 or murmur or increase in P2, and no edema   ABD:  obese but soft and nontender with nl excursion  MS:  Gait nl   ext warm without deformities Or obvious joint restrictions  calf tenderness, cyanosis or clubbing    SKIN: warm and dry without lesions    NEURO:  alert, approp, nl sensorium with  no motor or cerebellar deficits apparent.   Cxr 07/2022 wnl > rec repeat     Assessment   Assessment & Plan DOE (dyspnea on exertion) Active smoker with baseline wt pre-covid era  of about 190 lb wt   - Myoview  04/14/2023 nl  - Alpha one AT phenotype 12/23/2023  - 12/23/2023 at wt  239  Walked on RA  x 3  lap(s) =  approx 450  ft  @ fast pace, stopped due to end of study with lowest 02 sats 94% and the same discomfort he's had for years in epigastrium with each deep breath.   Symptoms are  disproportionate to objective findings and not clear to what extent this is actually a pulmonary  problem but pt does appear to have difficult to sort out respiratory symptoms of unknown origin for which  DDX  = almost all start with A and  include Adherence, Ace Inhibitors, Acid Reflux, Active Sinus Disease, Alpha 1 Antitripsin deficiency, Anxiety masquerading as Airways dz,  ABPA,  Allergy (esp in young), Aspiration (esp in elderly), Adverse effects of meds,  Active smoking or Vaping, A bunch of PE's/clot burden (a few small clots can't cause this syndrome unless there is already severe underlying pulm or vascular dz with poor reserve),  Anemia or thyroid  disorder, plus two Bs  = Bronchiectasis and Beta blocker use..and one C= CHF    He's already had a thorough cardiac eval for same problem so only the bolded dx's are of concern at this point and should be able to prove with pending doe labs and pfts that the main problem is anxiety related to DOE from wt gain so proceed with pfts and reconditioning see avs for instructions unique to this ov   F/u can be prn if losing ground with conditioning   >>> PFTs next available for baseline and to sep out restrictive (from wt) vs obst pattern better than the prior spirometry only   Each maintenance medication was reviewed in detail including emphasizing most importantly the difference between maintenance and prns and under what circumstances the prns are to be triggered using an action plan format where appropriate.  Total time for H and P, chart review, counseling,  directly observing portions of ambulatory 02 saturation study/ and generating  customized AVS unique to this office visit / same day charting = 61 min new pt eval with  multiple  refractory respiratory  symptoms of uncertain etiology                   AVS Patient Instructions  My office will be contacting you by phone for referral to [facility] for PFTs  - if you don't hear back from my office within one week please call us  back or notify us  thru MyChart and we'll address it right away.  Please remember to go to the lab department   for your tests - we will call you with the results when they are available.   To get the most out of exercise, you need to be continuously aware that you are short of breath, but never out of breath, for at least 30 minutes daily. As you improve, it will actually be easier for you to do the same amount of exercise  in  30 minutes so always push to the level where you are short of breath.    Make sure you check your oxygen saturations at highest level of activity    The key is to stop smoking completely before smoking completely stops you - it's not too late!   If losing ground please return and I will call  you when your tests available   Late add:   Please remember to go to the  x-ray department  @  [facility] for your tests - we will call you with the results when they are available      Ozell America, MD 12/23/2023

## 2023-12-23 ENCOUNTER — Encounter: Payer: Self-pay | Admitting: Internal Medicine

## 2023-12-23 ENCOUNTER — Telehealth: Payer: Self-pay | Admitting: Internal Medicine

## 2023-12-23 ENCOUNTER — Telehealth: Payer: Self-pay

## 2023-12-23 ENCOUNTER — Ambulatory Visit: Admitting: Internal Medicine

## 2023-12-23 VITALS — BP 147/85 | HR 85 | Ht 72.0 in | Wt 239.2 lb

## 2023-12-23 DIAGNOSIS — R0609 Other forms of dyspnea: Secondary | ICD-10-CM | POA: Diagnosis not present

## 2023-12-23 NOTE — Telephone Encounter (Signed)
 Dr wert FYI

## 2023-12-23 NOTE — Assessment & Plan Note (Addendum)
 Active smoker with baseline wt pre-covid era of about 190 lb wt   - Myoview  04/14/2023 nl  - Alpha one AT phenotype 12/23/2023  - 12/23/2023 at wt  239  Walked on RA  x 3  lap(s) =  approx 450  ft  @ fast pace, stopped due to end of study with lowest 02 sats 94% and the same discomfort he's had for years in epigastrium with each deep breath.   Symptoms are  disproportionate to objective findings and not clear to what extent this is actually a pulmonary  problem but pt does appear to have difficult to sort out respiratory symptoms of unknown origin for which  DDX  = almost all start with A and  include Adherence, Ace Inhibitors, Acid Reflux, Active Sinus Disease, Alpha 1 Antitripsin deficiency, Anxiety masquerading as Airways dz,  ABPA,  Allergy (esp in young), Aspiration (esp in elderly), Adverse effects of meds,  Active smoking or Vaping, A bunch of PE's/clot burden (a few small clots can't cause this syndrome unless there is already severe underlying pulm or vascular dz with poor reserve),  Anemia or thyroid  disorder, plus two Bs  = Bronchiectasis and Beta blocker use..and one C= CHF    He's already had a thorough cardiac eval for same problem so only the bolded dx's are of concern at this point and should be able to prove with pending doe labs and pfts that the main problem is anxiety related to DOE from wt gain so proceed with pfts and reconditioning see avs for instructions unique to this ov   F/u can be prn if losing ground with conditioning   >>> PFTs next available for baseline and to sep out restrictive (from wt) vs obst pattern better than the prior spirometry only   Each maintenance medication was reviewed in detail including emphasizing most importantly the difference between maintenance and prns and under what circumstances the prns are to be triggered using an action plan format where appropriate.  Total time for H and P, chart review, counseling,  directly observing portions of ambulatory  02 saturation study/ and generating customized AVS unique to this office visit / same day charting = 61 min new pt eval with  multiple  refractory respiratory  symptoms of uncertain etiology

## 2023-12-23 NOTE — Telephone Encounter (Signed)
 LVM for patient regarding the 02/24/24 1:00 pm PFT appointment at [facility] time is 12:45 pm---1st floor registration desk for check in--will mail information and instructions to patient and requested return call with questions

## 2023-12-23 NOTE — Patient Instructions (Addendum)
 My office will be contacting you by phone for referral to [facility] for PFTs  - if you don't hear back from my office within one week please call us  back or notify us  thru MyChart and we'll address it right away.   Please remember to go to the lab department   for your tests - we will call you with the results when they are available.   To get the most out of exercise, you need to be continuously aware that you are short of breath, but never out of breath, for at least 30 minutes daily. As you improve, it will actually be easier for you to do the same amount of exercise  in  30 minutes so always push to the level where you are short of breath.    Make sure you check your oxygen saturations at highest level of activity    The key is to stop smoking completely before smoking completely stops you - it's not too late!   If losing ground please return and I will call  you when your tests available   Late add:   Please remember to go to the  x-ray department  @  [facility] for your tests - we will call you with the results when they are available

## 2023-12-24 ENCOUNTER — Telehealth: Payer: Self-pay

## 2023-12-24 LAB — BASIC METABOLIC PANEL WITH GFR
BUN/Creatinine Ratio: 18 (ref 9–20)
BUN: 15 mg/dL (ref 6–20)
CO2: 18 mmol/L — ABNORMAL LOW (ref 20–29)
Calcium: 9.5 mg/dL (ref 8.7–10.2)
Chloride: 104 mmol/L (ref 96–106)
Creatinine, Ser: 0.83 mg/dL (ref 0.76–1.27)
Glucose: 108 mg/dL — ABNORMAL HIGH (ref 70–99)
Potassium: 4.3 mmol/L (ref 3.5–5.2)
Sodium: 139 mmol/L (ref 134–144)
eGFR: 116 mL/min/1.73 (ref >59)

## 2023-12-24 LAB — D-DIMER, QUANTITATIVE: D-DIMER: <0.2 mg{FEU}/L (ref 0.00–0.49)

## 2023-12-24 LAB — BRAIN NATRIURETIC PEPTIDE: BNP: 5.9 pg/mL (ref 0.0–100.0)

## 2023-12-24 NOTE — Telephone Encounter (Signed)
 See mychart msg

## 2023-12-26 ENCOUNTER — Ambulatory Visit: Payer: Self-pay | Admitting: Internal Medicine

## 2023-12-26 LAB — ALPHA-1-ANTITRYPSIN PHENOTYP: A-1 Antitrypsin: 156 mg/dL (ref 95–164)

## 2024-01-14 ENCOUNTER — Other Ambulatory Visit: Payer: Self-pay | Admitting: Cardiology

## 2024-01-22 DIAGNOSIS — D101 Benign neoplasm of tongue: Secondary | ICD-10-CM | POA: Diagnosis not present

## 2024-02-24 ENCOUNTER — Ambulatory Visit (HOSPITAL_COMMUNITY): Admission: RE | Admit: 2024-02-24 | Source: Ambulatory Visit

## 2024-05-11 ENCOUNTER — Ambulatory Visit: Attending: Nurse Practitioner | Admitting: Nurse Practitioner

## 2024-05-11 ENCOUNTER — Encounter: Payer: Self-pay | Admitting: Nurse Practitioner

## 2024-05-11 VITALS — BP 159/109 | HR 108 | Ht 72.0 in | Wt 247.8 lb

## 2024-05-11 DIAGNOSIS — Z720 Tobacco use: Secondary | ICD-10-CM

## 2024-05-11 DIAGNOSIS — R079 Chest pain, unspecified: Secondary | ICD-10-CM | POA: Diagnosis not present

## 2024-05-11 DIAGNOSIS — E785 Hyperlipidemia, unspecified: Secondary | ICD-10-CM | POA: Diagnosis not present

## 2024-05-11 DIAGNOSIS — I10 Essential (primary) hypertension: Secondary | ICD-10-CM | POA: Diagnosis not present

## 2024-05-11 DIAGNOSIS — R000 Tachycardia, unspecified: Secondary | ICD-10-CM

## 2024-05-11 DIAGNOSIS — R0609 Other forms of dyspnea: Secondary | ICD-10-CM

## 2024-05-11 NOTE — Patient Instructions (Addendum)
 Medication Instructions:  Your physician recommends that you continue on your current medications as directed. Please refer to the Current Medication list given to you today.   Labwork: None  Testing/Procedures: None  Follow-Up: Your physician recommends that you schedule a follow-up appointment in: 3 months  Any Other Special Instructions Will Be Listed Below (If Applicable). In 2-3 weeks please send us  on your blood pressures on MyChart    If you need a refill on your cardiac medications before your next appointment, please call your pharmacy.

## 2024-05-11 NOTE — Progress Notes (Signed)
 " Cardiology Office Note:  .   Date:  05/11/2024 ID:  Tyler Dean, DOB 07/17/1985, MRN [id number] PCP: Jolee Elsie RAMAN, PA  [facility] Providers Cardiologist:  Jayson Sierras, MD    History of Present Illness: Tyler Dean is a 39 y.o. male with a PMH of hypertension, hyperlipidemia, borderline dilated aortic root, migraines, and ADHD, who presents today for 6 month follow-up.  Last seen by Dr. Sierras on Aug 29, 2021.  He was overall doing well from a cardiac perspective.  Admitted 12/2021 for rhabdomyolysis secondary to excessive exercise/working out. Improved with IV fluids.   ED visit 06/2022 for atypical CP. 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  11/28/2022 - Today he presents for follow-up.  He states he is doing well.  He has been having myalgias that he states his medical team says is not related to Repatha .  He has been having thorough workup done to see if he has an autoimmune condition.  Blood pressure is elevated in office today as he has not taken his blood pressure medication this AM.  Admits to consistent shortness of breath, feels as though he is not able to take a full complete breath, attributes this to his smoking habit.  Smokes half pack per day, he is looking to quit. Denies any chest pain, palpitations, syncope, presyncope, dizziness, orthopnea, PND, swelling or significant weight changes, acute bleeding, or claudication.   ED visit on 01/01/2023 for flank pain, workup was reassuring, felt symptoms d/t muscle strain d/t exercising. UC visit on 02/17/23 for myalgia after exercising and workup was reassuring.   02/28/2023 - Today he presents for follow-up. No change in symptoms since I last saw him. Does admit to recent stress and denies any red flag signs or symptoms. Denies any chest pain, palpitations, syncope, presyncope,  dizziness, orthopnea, PND, swelling or significant weight changes, acute bleeding, or claudication. Continues to admit to stable shortness of breath since last office visit.   11/06/2023 -  Here for 6 month follow-up. Admits to continued episodes of random chest pain, stable over time. Occurs along left side of chest. Denies any specific triggers, says it occurs over 1 hour or more. Denies any shortness of breath, palpitations, syncope, presyncope, dizziness, orthopnea, PND, swelling or significant weight changes, acute bleeding, or claudication. Says he has returned to taking Repatha , stopped it momentarily not long ago to see if this would improve his brain fog.   05/11/2024 - He is here for follow-up.  Says he left the gym about an hour ago and took a preworkout and blood pressure and heart rate are up today.  Says his home blood pressures are usually well-controlled.  Does admit to some chest tightness at times, wonders if it is musculoskeletal related.  Does notice he gets out of breath easily and is scheduled for PFTs in the future. Says this is becoming more noticeable over time.  He is following pulmonologist, Dr. Darlean.  Does state he has not been as consistent taking his Repatha . Denies any palpitations, syncope, presyncope, dizziness, orthopnea, PND, swelling or significant weight changes, acute bleeding, or claudication.  SH: Does seldom drink alcohol, does admit to smoking 1 PPD, denies any ilicit drug use.   FH: Does report a strong family history of cardiovascular disease. Paternal uncle died at age 70 from heart disease.   Studies Reviewed: SABRA    EKG:  EKG Interpretation Date/Time:  Tuesday May 11 2024 09:49:47 EST Ventricular Rate:  99 PR Interval:  122 QRS Duration:  74 QT Interval:  344 QTC Calculation: 441 R Axis:   32  Text Interpretation: Normal sinus rhythm Normal ECG When compared with ECG of 06-Nov-2023 15:37, No significant change was found Confirmed by Miriam Norris  [phone]) on 05/14/2024 1:07:42 PM EKG Interpretation Date/Time:  Tuesday May 11 2024 09:49:47 EST Ventricular Rate:  99 PR Interval:  122 QRS Duration:  74 QT Interval:  344 QTC Calculation: 441 R Axis:   32  Text Interpretation: Normal sinus rhythm Normal ECG When compared with ECG of 06-Nov-2023 15:37, No significant change was found Confirmed by Miriam Norris [phone]) on 05/14/2024 1:07:42 PM   NST 03/2023:    The study is normal. There are no perfusion defects The study is low risk.   No ST deviation was noted.   LV perfusion is normal.   Left ventricular function is normal. Nuclear stress EF: 65%. The left ventricular ejection fraction is normal (55-65%). End diastolic cavity size is normal.  Echo 11/2022:   1. Left ventricular ejection fraction, by estimation, is 55 to 60%. The  left ventricle has normal function. The left ventricle has no regional  wall motion abnormalities. Left ventricular diastolic parameters were  normal.   2. Right ventricular systolic function is normal. The right ventricular  size is normal. Tricuspid regurgitation signal is inadequate for assessing  PA pressure.   3. The mitral valve is normal in structure. No evidence of mitral valve  regurgitation. No evidence of mitral stenosis.   4. The aortic valve is tricuspid. Aortic valve regurgitation is not  visualized. No aortic stenosis is present.   5. The inferior vena cava is normal in size with greater than 50%  respiratory variability, suggesting right atrial pressure of 3 mmHg.   Comparison(s): EF 55-60% Mildy dilated aortic root (39mm).  Lexiscan  01/2021:   The study is normal. The study is low risk.   No ST deviation was noted. The ECG was negative for ischemia.   LV perfusion is normal.   Left ventricular function is normal. Nuclear stress EF: 56 %.   Low risk study with no significant myocardial perfusion defects to indicate ischemia and normal LVEF of 56%.   Echocardiogram 12/2020: 1.   Left ventricular EF 55 to 60%.  Left ventricle is normal function.  Left ventricle has no regional wall motion abnormalities.  Left ventricular diastolic parameters are normal. 2.  Right ventricular systolic function is normal.  Right ventricular size is normal.  Tricuspid regurgitation signal is inadequate for assessing PA pressure. 3.  Mitral valve is grossly normal.  Trivial mitral valve regurgitation. 4.  Aortic valve is tricuspid.  Aortic valve regurgitation is not visualized. 5.  Aortic dilatation noted.  There is borderline dilatation of aortic root, measuring 39 mm. 6.  The inferior vena cava is normal in size with greater than 50% respiratory variability, suggesting right atrial pressure of 3 mmHg.  Physical Exam:   VS:  BP (!) 159/109   Pulse (!) 108   Ht 6' (1.829 m)   Wt 247 lb 12.8 oz (112.4 kg)   SpO2 96%   BMI 33.61 kg/m    Wt Readings from Last 3 Encounters:  05/11/24 247 lb 12.8 oz (112.4 kg)  12/23/23 239 lb 3.2 oz (108.5 kg)  11/06/23 234 lb (106.1 kg)    GEN: Well nourished, well developed in no acute distress NECK: No  JVD; No carotid bruits CARDIAC: S1/S2, slightly fast and regular rhythm, no murmurs, rubs, gallops RESPIRATORY:  Clear to auscultation without rales, wheezing or rhonchi  ABDOMEN: Soft, non-tender, non-distended EXTREMITIES:  No edema; No deformity   ASSESSMENT AND PLAN: .    Chest pain of uncertain etiology Etiology unclear. Previous NST 03/2023 was benign. EKG is reassuring today. Did discuss tx options, pt declines at this time. Will continue to monitor. No medication changes at this time. Heart healthy diet and regular cardiovascular exercise encouraged. Care and ED precautions discussed.   HTN, tachycardia Blood pressure elevated today due to taking his preworkout.  And heart rate is slightly fast due to his preworkout as well.  Discussed to monitor BP at home at least 2 hours after medications and sitting for 5-10 minutes.  Continue losartan .   He will contact our office if SBP remains > 130. Heart healthy diet and regular cardiovascular exercise encouraged.  Discussed when to contact our office and update us  on his BP readings.  HLD Most recent labs on file are not at goal.  Continue Repatha  and did discuss medication compliance.  He verbalized understanding   heart healthy diet and regular cardiovascular exercise encouraged.  Once patient has become compliant consistent with medication, plan to recheck labs.  Tobacco abuse Smoking cessation encouraged and discussed.  5. DOE Etiology multifactorial.  He is scheduled for upcoming PFTs and being followed by his pulmonologist.  If no improvement by next office visit, plan to consider updating echocardiogram.  Dispo: Follow-up with me/APP in 3 months or sooner if anything changes.  Signed, Almarie Crate, NP   "

## 2024-08-09 ENCOUNTER — Ambulatory Visit: Admitting: Cardiology
